# Patient Record
Sex: Female | Born: 1940 | Race: White | Hispanic: No | Smoking: Never smoker
Health system: Southern US, Community
[De-identification: ages and names within clinical notes are randomized; demographics above are authoritative.]

## PROBLEM LIST (undated history)

## (undated) DIAGNOSIS — Z8489 Family history of other specified conditions: Secondary | ICD-10-CM

## (undated) DIAGNOSIS — F329 Major depressive disorder, single episode, unspecified: Secondary | ICD-10-CM

## (undated) DIAGNOSIS — F419 Anxiety disorder, unspecified: Secondary | ICD-10-CM

## (undated) DIAGNOSIS — G4730 Sleep apnea, unspecified: Secondary | ICD-10-CM

## (undated) DIAGNOSIS — G4700 Insomnia, unspecified: Secondary | ICD-10-CM

## (undated) DIAGNOSIS — R079 Chest pain, unspecified: Secondary | ICD-10-CM

## (undated) DIAGNOSIS — K219 Gastro-esophageal reflux disease without esophagitis: Secondary | ICD-10-CM

## (undated) DIAGNOSIS — K589 Irritable bowel syndrome without diarrhea: Secondary | ICD-10-CM

## (undated) DIAGNOSIS — F32A Depression, unspecified: Secondary | ICD-10-CM

## (undated) DIAGNOSIS — C50919 Malignant neoplasm of unspecified site of unspecified female breast: Secondary | ICD-10-CM

## (undated) DIAGNOSIS — C801 Malignant (primary) neoplasm, unspecified: Secondary | ICD-10-CM

## (undated) DIAGNOSIS — E785 Hyperlipidemia, unspecified: Secondary | ICD-10-CM

## (undated) DIAGNOSIS — K649 Unspecified hemorrhoids: Secondary | ICD-10-CM

## (undated) DIAGNOSIS — I10 Essential (primary) hypertension: Secondary | ICD-10-CM

## (undated) DIAGNOSIS — E119 Type 2 diabetes mellitus without complications: Secondary | ICD-10-CM

## (undated) DIAGNOSIS — R011 Cardiac murmur, unspecified: Secondary | ICD-10-CM

## (undated) DIAGNOSIS — R7302 Impaired glucose tolerance (oral): Secondary | ICD-10-CM

## (undated) DIAGNOSIS — Z23 Encounter for immunization: Secondary | ICD-10-CM

## (undated) DIAGNOSIS — C9110 Chronic lymphocytic leukemia of B-cell type not having achieved remission: Secondary | ICD-10-CM

## (undated) DIAGNOSIS — I209 Angina pectoris, unspecified: Secondary | ICD-10-CM

## (undated) DIAGNOSIS — M1990 Unspecified osteoarthritis, unspecified site: Secondary | ICD-10-CM

## (undated) HISTORY — DX: Unspecified hemorrhoids: K64.9

## (undated) HISTORY — DX: Irritable bowel syndrome, unspecified: K58.9

## (undated) HISTORY — PX: MASTECTOMY: SHX3

## (undated) HISTORY — PX: TONSILLECTOMY: SUR1361

## (undated) HISTORY — DX: Hyperlipidemia, unspecified: E78.5

## (undated) HISTORY — PX: BREAST SURGERY: SHX581

## (undated) HISTORY — PX: CHOLECYSTECTOMY: SHX55

## (undated) HISTORY — DX: Impaired glucose tolerance (oral): R73.02

## (undated) HISTORY — PX: ABDOMINAL HYSTERECTOMY: SHX81

## (undated) HISTORY — DX: Malignant neoplasm of unspecified site of unspecified female breast: C50.919

## (undated) HISTORY — DX: Encounter for immunization: Z23

---

## 1989-01-23 DIAGNOSIS — C801 Malignant (primary) neoplasm, unspecified: Secondary | ICD-10-CM

## 1989-01-23 HISTORY — DX: Malignant (primary) neoplasm, unspecified: C80.1

## 1997-12-08 ENCOUNTER — Other Ambulatory Visit: Admission: RE | Admit: 1997-12-08 | Discharge: 1997-12-08 | Payer: Self-pay | Admitting: Obstetrics and Gynecology

## 1998-09-16 ENCOUNTER — Ambulatory Visit (HOSPITAL_COMMUNITY): Admission: RE | Admit: 1998-09-16 | Discharge: 1998-09-16 | Payer: Self-pay | Admitting: Gastroenterology

## 1998-09-16 ENCOUNTER — Encounter: Payer: Self-pay | Admitting: Gastroenterology

## 1999-06-16 ENCOUNTER — Encounter: Admission: RE | Admit: 1999-06-16 | Discharge: 1999-06-16 | Payer: Self-pay | Admitting: Internal Medicine

## 1999-06-16 ENCOUNTER — Encounter: Payer: Self-pay | Admitting: Internal Medicine

## 1999-07-21 ENCOUNTER — Other Ambulatory Visit: Admission: RE | Admit: 1999-07-21 | Discharge: 1999-07-21 | Payer: Self-pay | Admitting: Obstetrics and Gynecology

## 2000-08-21 ENCOUNTER — Other Ambulatory Visit: Admission: RE | Admit: 2000-08-21 | Discharge: 2000-08-21 | Payer: Self-pay | Admitting: Obstetrics and Gynecology

## 2000-09-28 ENCOUNTER — Encounter: Payer: Self-pay | Admitting: Obstetrics and Gynecology

## 2000-09-28 ENCOUNTER — Encounter: Admission: RE | Admit: 2000-09-28 | Discharge: 2000-09-28 | Payer: Self-pay | Admitting: Obstetrics and Gynecology

## 2000-10-18 ENCOUNTER — Ambulatory Visit (HOSPITAL_COMMUNITY): Admission: RE | Admit: 2000-10-18 | Discharge: 2000-10-18 | Payer: Self-pay | Admitting: Gastroenterology

## 2001-10-10 ENCOUNTER — Encounter: Admission: RE | Admit: 2001-10-10 | Discharge: 2001-10-10 | Payer: Self-pay | Admitting: Obstetrics and Gynecology

## 2001-10-10 ENCOUNTER — Encounter: Payer: Self-pay | Admitting: Obstetrics and Gynecology

## 2001-10-24 ENCOUNTER — Other Ambulatory Visit: Admission: RE | Admit: 2001-10-24 | Discharge: 2001-10-24 | Payer: Self-pay | Admitting: Obstetrics and Gynecology

## 2002-08-06 ENCOUNTER — Emergency Department (HOSPITAL_COMMUNITY): Admission: EM | Admit: 2002-08-06 | Discharge: 2002-08-07 | Payer: Self-pay | Admitting: Emergency Medicine

## 2002-08-06 ENCOUNTER — Encounter: Payer: Self-pay | Admitting: Emergency Medicine

## 2002-10-21 ENCOUNTER — Encounter: Admission: RE | Admit: 2002-10-21 | Discharge: 2002-10-21 | Payer: Self-pay | Admitting: Obstetrics and Gynecology

## 2002-10-21 ENCOUNTER — Encounter: Payer: Self-pay | Admitting: Obstetrics and Gynecology

## 2003-02-24 ENCOUNTER — Other Ambulatory Visit: Admission: RE | Admit: 2003-02-24 | Discharge: 2003-02-24 | Payer: Self-pay | Admitting: Obstetrics and Gynecology

## 2003-04-22 ENCOUNTER — Other Ambulatory Visit (HOSPITAL_COMMUNITY): Admission: RE | Admit: 2003-04-22 | Discharge: 2003-04-27 | Payer: Self-pay | Admitting: Psychiatry

## 2003-06-15 ENCOUNTER — Encounter: Admission: RE | Admit: 2003-06-15 | Discharge: 2003-06-15 | Payer: Self-pay | Admitting: Psychiatry

## 2004-03-14 ENCOUNTER — Encounter: Admission: RE | Admit: 2004-03-14 | Discharge: 2004-03-14 | Payer: Self-pay | Admitting: Obstetrics and Gynecology

## 2004-07-19 ENCOUNTER — Ambulatory Visit (HOSPITAL_BASED_OUTPATIENT_CLINIC_OR_DEPARTMENT_OTHER): Admission: RE | Admit: 2004-07-19 | Discharge: 2004-07-19 | Payer: Self-pay

## 2004-07-24 ENCOUNTER — Ambulatory Visit: Payer: Self-pay | Admitting: Internal Medicine

## 2004-09-01 ENCOUNTER — Ambulatory Visit (HOSPITAL_BASED_OUTPATIENT_CLINIC_OR_DEPARTMENT_OTHER): Admission: RE | Admit: 2004-09-01 | Discharge: 2004-09-01 | Payer: Self-pay | Admitting: *Deleted

## 2004-09-04 ENCOUNTER — Ambulatory Visit: Payer: Self-pay | Admitting: Internal Medicine

## 2004-10-18 ENCOUNTER — Ambulatory Visit: Payer: Self-pay | Admitting: Internal Medicine

## 2005-05-03 ENCOUNTER — Encounter: Admission: RE | Admit: 2005-05-03 | Discharge: 2005-05-03 | Payer: Self-pay | Admitting: Internal Medicine

## 2005-11-07 ENCOUNTER — Ambulatory Visit (HOSPITAL_COMMUNITY): Admission: RE | Admit: 2005-11-07 | Discharge: 2005-11-07 | Payer: Self-pay | Admitting: Orthopedic Surgery

## 2006-08-23 ENCOUNTER — Encounter: Admission: RE | Admit: 2006-08-23 | Discharge: 2006-08-23 | Payer: Self-pay | Admitting: Internal Medicine

## 2007-12-05 ENCOUNTER — Encounter: Admission: RE | Admit: 2007-12-05 | Discharge: 2007-12-05 | Payer: Self-pay | Admitting: Internal Medicine

## 2009-10-04 ENCOUNTER — Encounter: Admission: RE | Admit: 2009-10-04 | Discharge: 2009-10-04 | Payer: Self-pay | Admitting: Internal Medicine

## 2010-02-12 ENCOUNTER — Encounter: Payer: Self-pay | Admitting: Obstetrics and Gynecology

## 2010-02-13 ENCOUNTER — Encounter: Payer: Self-pay | Admitting: Internal Medicine

## 2010-06-10 NOTE — Procedures (Signed)
[name], Rebecca Lowery              ACCOUNT NO.:  [id number]   MEDICAL RECORD NO.:  [id number]          PATIENT TYPE:  OUT   LOCATION:  SLEEP CENTER                 FACILITY:  [facility]   PHYSICIAN:  Clinton D. Maple Hudson, M.D. DATE OF BIRTH:  1941/01/21   DATE OF STUDY:                              NOCTURNAL POLYSOMNOGRAM   REFERRING PHYSICIAN:  Dr. Forde Radon.   DATE OF STUDY:  September 01, 2004.   INDICATION FOR STUDY:  Insomnia with sleep apnea. Epworth sleepiness score  12/24, BMI 34. Weight 190 pounds. A diagnostic and PSG on July 19, 2004  reported an RDI of 13.8 per hour with short sleep time. C-PAP titration is  requested.   SLEEP ARCHITECTURE:  Total sleep time 28 minutes with sleep efficiency 7%.  Stage I was 23%, stage II 55%, stages III and IV 21%, REM was absent. Sleep  latency 47 minutes, awake after sleep onset 372 minutes (6.2 hours) arousal  index 21. No medication taken.   RESPIRATORY DATA:  C-PAP titration to 7 CWP, RDI 0 per hour using a  Respironics ComfortGel nasal mask size small with heated humidifier.   OXYGEN DATA:  Snoring was prevented during brief monitoring opportunity, at  a C-PAP 7 with oxygen saturation maintained 95-98% on C-PAP.   CARDIAC DATA:  Normal sinus rhythm.   MOVEMENT/PARASOMNIA:  Frequent leg jerks and one bathroom break. Technician  reported she experienced severe limb jerks and restlessness throughout the  night. She complained that she had taken Ambien for 10 years but it did not  work well any more.   IMPRESSION/RECOMMENDATIONS:  1.  Significant insomnia with difficulty initiating and maintaining sleep.  2.  Mild obstructive sleep apnea/hypopneas syndrome documented on July 19, 2004 with an respiratory disturbance index of 13.8 per hour.  3.  Current C-PAP titration effort to 7 centimeter of water pressure was not      associated with apneas or snoring during limited period of time while      asleep with C-PAP. A small Respironics  ComfortGel nasal mask was used      with heated humidifier. She may learn to and benefit from this approach      at home but treatment of this will need of her insomnia pattern.  4.  Frequent leg jerks periodic with movement with arousal syndrome. This      may correspond to daytime restless leg syndrome that is present.      Clinton D. Maple Hudson, M.D.  Diplomate, Biomedical engineer of Sleep Medicine  Electronically Signed     CDY/MEDQ  D:  09/04/2004 12:09:51  T:  09/04/2004 22:33:07  Job:  [id number]

## 2010-06-10 NOTE — Procedures (Signed)
Rebecca Lowery, Rebecca Lowery              ACCOUNT NO.:  [id number]   MEDICAL RECORD NO.:  [id number]          PATIENT TYPE:  OUT   LOCATION:  SLEEP CENTER                 FACILITY:  [facility]   PHYSICIAN:  Clinton D. Maple Hudson, M.D. DATE OF BIRTH:  [birth year]   DATE OF STUDY:  07/19/2004                              NOCTURNAL POLYSOMNOGRAM   REFERRING PHYSICIAN:  Dr. Ronaldo Miyamoto Long   DATE OF STUDY:  July 19, 2004   INDICATION FOR STUDY:  Insomnia with sleep apnea.  Epworth Sleepiness Score  8/24, BMI 34, weight 190 pounds.   SLEEP ARCHITECTURE:  Total sleep time very short, 52 minutes, sleep  efficiency 15%.  Stage I was 53%, stage II 47%, stages III, IV and REM were  absent.  Sleep latency 165 minutes with sleep onset at 1:20 a.m.  Awake  after sleep onset 137 minutes.  Arousal index markedly increased at 120.  Technician described her as extremely restless during the night with  difficulty obtaining and maintaining sleep.  She sat up on the side of the  bed several times stating that she was trying to get comfortable.  She also  complained of nausea and headache.  She took 15 mg of Ambien at lights out  and described usual pattern at home as sleeping for a few hours and then  awake until early morning.  She woke at 3:49 a.m. complaining of headache,  took three ibuprofen.  She was unable to return to sleep and the study was  ended at 4:33 a.m. at the patient's request.  She gives history suggesting  that she sleeps best in the daytime which may reflect some nocturnal anxiety  or impaired sleep hygiene.   RESPIRATORY DATA:  NPSG protocol.  Respiratory disturbance index (RDI, AHI)  13.8 obstructive events per hour indicating mild obstructive sleep  apnea/hypopnea syndrome.  This reflected 12 hypopneas in this short sleep  time.  All events happened while lying on the left side.   OXYGEN DATA:  Moderate snoring with oxygen desaturation to a nadir of 91%.  Mean oxygen saturation through the study was  95% on room air.   CARDIAC DATA:  Normal sinus rhythm.   MOVEMENT/PARASOMNIA:  A total of 184 limb jerks were recorded of which 25  were associated with arousal or awakening for a periodic limb movement with  arousal index markedly increased at 28.8 per hour.  These correlated with  intervals of arousal.   IMPRESSION/RECOMMENDATION:  1.  Markedly short sleep time with difficulty initiating and maintaining      sleep, complaints of headache and nausea despite bedtime Ambien.  2.  Mild obstructive sleep apnea/hypopnea syndrome, respiratory disturbance      index 13.8 per hour with moderate snoring and oxygen desaturation to      91%.  Significance of respiratory disturbance is less clear because of      the very short sleep time but, if representative of a full night, a      score in this range would invite return for continuous positive airway      pressure titration or evaluation for alternative therapies for sleep  apnea.  3.  Periodic limb movement with arousal, 28.8 per hour.  Specific therapy      directed first at the leg jerks, such as clonazepam or Requip, might be      considered.      Clinton D. Maple Hudson, M.D.  Diplomat    CDY/MEDQ  D:  07/24/2004 11:59:36  T:  07/24/2004 18:44:54  Job:  [id number]

## 2010-09-21 ENCOUNTER — Other Ambulatory Visit: Payer: Self-pay | Admitting: Internal Medicine

## 2010-09-21 DIAGNOSIS — Z9012 Acquired absence of left breast and nipple: Secondary | ICD-10-CM

## 2010-10-21 ENCOUNTER — Ambulatory Visit
Admission: RE | Admit: 2010-10-21 | Discharge: 2010-10-21 | Disposition: A | Discharge status: Home / Self Care | Payer: PRIVATE HEALTH INSURANCE | Source: Ambulatory Visit | Attending: Internal Medicine | Admitting: Internal Medicine

## 2010-10-21 DIAGNOSIS — Z9012 Acquired absence of left breast and nipple: Secondary | ICD-10-CM

## 2011-04-14 ENCOUNTER — Other Ambulatory Visit (HOSPITAL_COMMUNITY): Payer: Self-pay | Admitting: Cardiology

## 2011-04-14 ENCOUNTER — Other Ambulatory Visit: Payer: Self-pay | Admitting: Cardiology

## 2011-04-14 ENCOUNTER — Ambulatory Visit
Admission: RE | Admit: 2011-04-14 | Discharge: 2011-04-14 | Disposition: A | Discharge status: Home / Self Care | Payer: Medicare Other | Source: Ambulatory Visit | Attending: Cardiology | Admitting: Cardiology

## 2011-04-14 DIAGNOSIS — R0789 Other chest pain: Secondary | ICD-10-CM

## 2011-04-19 ENCOUNTER — Encounter (HOSPITAL_COMMUNITY)
Admission: RE | Admit: 2011-04-19 | Discharge: 2011-04-19 | Disposition: A | Discharge status: Home / Self Care | Payer: Medicare Other | Source: Ambulatory Visit | Attending: Cardiology | Admitting: Cardiology

## 2011-04-19 DIAGNOSIS — E119 Type 2 diabetes mellitus without complications: Secondary | ICD-10-CM | POA: Insufficient documentation

## 2011-04-19 DIAGNOSIS — R079 Chest pain, unspecified: Secondary | ICD-10-CM | POA: Insufficient documentation

## 2011-04-19 DIAGNOSIS — I10 Essential (primary) hypertension: Secondary | ICD-10-CM | POA: Insufficient documentation

## 2011-04-19 MED ORDER — REGADENOSON 0.4 MG/5ML IV SOLN
INTRAVENOUS | Status: AC
  Administered 2011-04-19: 12:00:00
  Filled 2011-04-19: qty 5

## 2011-04-19 MED ORDER — TECHNETIUM TC 99M TETROFOSMIN IV KIT
10.0000 | PACK | Freq: Once | INTRAVENOUS | Status: AC | PRN
  Administered 2011-04-19: 10 via INTRAVENOUS

## 2011-04-19 MED ORDER — TECHNETIUM TC 99M TETROFOSMIN IV KIT
30.0000 | PACK | Freq: Once | INTRAVENOUS | Status: AC | PRN
Start: 2011-04-19 — End: 2011-04-19
  Administered 2011-04-19: 30 via INTRAVENOUS

## 2011-04-19 MED ORDER — REGADENOSON 0.4 MG/5ML IV SOLN
INTRAVENOUS | Status: AC
  Filled 2011-04-19: qty 5

## 2012-03-13 ENCOUNTER — Other Ambulatory Visit (HOSPITAL_COMMUNITY): Payer: Self-pay | Admitting: Orthopaedic Surgery

## 2012-03-20 ENCOUNTER — Encounter (HOSPITAL_COMMUNITY): Payer: Self-pay | Admitting: Pharmacy Technician

## 2012-03-21 ENCOUNTER — Encounter (HOSPITAL_COMMUNITY): Payer: Self-pay

## 2012-03-21 ENCOUNTER — Encounter (HOSPITAL_COMMUNITY)
Admission: RE | Admit: 2012-03-21 | Discharge: 2012-03-21 | Disposition: A | Discharge status: Home / Self Care | Payer: Medicare Other | Source: Ambulatory Visit | Attending: Orthopaedic Surgery | Admitting: Orthopaedic Surgery

## 2012-03-21 HISTORY — DX: Gastro-esophageal reflux disease without esophagitis: K21.9

## 2012-03-21 HISTORY — DX: Chest pain, unspecified: R07.9

## 2012-03-21 HISTORY — DX: Family history of other specified conditions: Z84.89

## 2012-03-21 HISTORY — DX: Essential (primary) hypertension: I10

## 2012-03-21 HISTORY — DX: Cardiac murmur, unspecified: R01.1

## 2012-03-21 HISTORY — DX: Type 2 diabetes mellitus without complications: E11.9

## 2012-03-21 HISTORY — DX: Major depressive disorder, single episode, unspecified: F32.9

## 2012-03-21 HISTORY — DX: Depression, unspecified: F32.A

## 2012-03-21 HISTORY — DX: Sleep apnea, unspecified: G47.30

## 2012-03-21 HISTORY — DX: Anxiety disorder, unspecified: F41.9

## 2012-03-21 HISTORY — DX: Malignant (primary) neoplasm, unspecified: C80.1

## 2012-03-21 HISTORY — DX: Insomnia, unspecified: G47.00

## 2012-03-21 HISTORY — DX: Unspecified osteoarthritis, unspecified site: M19.90

## 2012-03-21 LAB — URINALYSIS, ROUTINE W REFLEX MICROSCOPIC
Bilirubin Urine: NEGATIVE
Nitrite: POSITIVE — AB
Specific Gravity, Urine: 1.018 (ref 1.005–1.030)
Urobilinogen, UA: 0.2 mg/dL (ref 0.0–1.0)

## 2012-03-21 LAB — SURGICAL PCR SCREEN
MRSA, PCR: NEGATIVE
Staphylococcus aureus: NEGATIVE

## 2012-03-21 LAB — APTT: aPTT: 32 seconds (ref 24–37)

## 2012-03-21 LAB — CBC
MCH: 30.4 pg (ref 26.0–34.0)
MCV: 91.1 fL (ref 78.0–100.0)
Platelets: 295 10*3/uL (ref 150–400)
RDW: 13.5 % (ref 11.5–15.5)
WBC: 13.2 10*3/uL — ABNORMAL HIGH (ref 4.0–10.5)

## 2012-03-21 LAB — PROTIME-INR: INR: 1.02 (ref 0.00–1.49)

## 2012-03-21 LAB — BASIC METABOLIC PANEL
CO2: 29 mEq/L (ref 19–32)
Calcium: 10.2 mg/dL (ref 8.4–10.5)
Creatinine, Ser: 0.67 mg/dL (ref 0.50–1.10)

## 2012-03-21 LAB — URINE MICROSCOPIC-ADD ON

## 2012-03-21 NOTE — Patient Instructions (Signed)
YOUR SURGERY IS SCHEDULED AT [facility]  ON:  Friday  3/7  REPORT TO [place] SHORT STAY CENTER AT:  7:30 AM      PHONE # FOR SHORT STAY IS [phone]  DO NOT EAT OR DRINK ANYTHING AFTER MIDNIGHT THE NIGHT BEFORE YOUR SURGERY.  YOU MAY BRUSH YOUR TEETH, RINSE OUT YOUR MOUTH--BUT NO WATER, NO FOOD, NO CHEWING GUM, NO MINTS, NO CANDIES, NO CHEWING TOBACCO.  PLEASE TAKE THE FOLLOWING MEDICATIONS THE AM OF YOUR SURGERY WITH A FEW SIPS OF WATER:  ABILIFY, WELLBUTRIN, LORAZEPAM    IF YOU ARE DIABETIC:  DO NOT TAKE ANY DIABETIC MEDICATIONS THE AM OF YOUR SURGERY.  IF YOU TAKE INSULIN IN THE EVENINGS--PLEASE ONLY TAKE 1/2 NORMAL EVENING DOSE THE NIGHT BEFORE YOUR SURGERY.  NO INSULIN THE AM OF YOUR SURGERY.  IF YOU HAVE SLEEP APNEA AND USE CPAP OR BIPAP--PLEASE BRING THE MASK AND THE TUBING.  DO NOT BRING YOUR MACHINE.  DO NOT BRING VALUABLES, MONEY, CREDIT CARDS.  DO NOT WEAR JEWELRY, MAKE-UP, NAIL POLISH AND NO METAL PINS OR CLIPS IN YOUR HAIR. CONTACT LENS, DENTURES / PARTIALS, GLASSES SHOULD NOT BE WORN TO SURGERY AND IN MOST CASES-HEARING AIDS WILL NEED TO BE REMOVED.  BRING YOUR GLASSES CASE, ANY EQUIPMENT NEEDED FOR YOUR CONTACT LENS. FOR PATIENTS ADMITTED TO THE HOSPITAL--CHECK OUT TIME THE DAY OF DISCHARGE IS 11:00 AM.  ALL INPATIENT ROOMS ARE PRIVATE - WITH BATHROOM, TELEPHONE, TELEVISION AND WIFI INTERNET.                              PLEASE READ OVER ANY  FACT SHEETS THAT YOU WERE GIVEN: MRSA INFORMATION, BLOOD TRANSFUSION INFORMATION FAILURE TO FOLLOW THESE INSTRUCTIONS MAY RESULT IN THE CANCELLATION OF YOUR SURGERY.   PATIENT SIGNATURE_________________________________

## 2012-03-21 NOTE — Pre-Procedure Instructions (Signed)
CXR REPORT IN EPIC FROM 04/14/11. NORMAL NUCLEAR STRESS TEST IN EPIC FROM 04/19/11. EKG WAS DONE TODAY AT [facility].

## 2012-03-21 NOTE — Pre-Procedure Instructions (Signed)
SHERRIE BILLINGS NOTIFIED THAT I AM TRYING TO FAX TO DR. BLACKMAN PT'S ABNORMAL URINE REPORTS, CBC AND BMET FOR REVIEW OF ABNORMALS AND THAT PT WANTS TO TALK TO DR BEFORE SIGNING THE CONSENT FOR SURGERY-SHE PLANS TO CALL HIM.

## 2012-03-23 LAB — URINE CULTURE

## 2012-03-25 NOTE — Pre-Procedure Instructions (Signed)
URINE CULTURE RESULTS FAXED TO DR. C. BLACKMAN'S OFFICE AND SHERRIE IN SURGERY SCHEDULING AT DR. [facility] OFFICE NOTIFIED

## 2012-03-29 ENCOUNTER — Inpatient Hospital Stay (HOSPITAL_COMMUNITY): Payer: Medicare Other

## 2012-03-29 ENCOUNTER — Encounter (HOSPITAL_COMMUNITY): Payer: Self-pay | Admitting: Anesthesiology

## 2012-03-29 ENCOUNTER — Encounter (HOSPITAL_COMMUNITY): Payer: Self-pay | Admitting: *Deleted

## 2012-03-29 ENCOUNTER — Inpatient Hospital Stay (HOSPITAL_COMMUNITY): Payer: Medicare Other | Admitting: Anesthesiology

## 2012-03-29 ENCOUNTER — Encounter (HOSPITAL_COMMUNITY)
Admission: RE | Disposition: A | Discharge status: Skilled Nursing Facility | Payer: Self-pay | Source: Ambulatory Visit | Attending: Orthopaedic Surgery

## 2012-03-29 ENCOUNTER — Inpatient Hospital Stay (HOSPITAL_COMMUNITY)
Admission: RE | Admit: 2012-03-29 | Discharge: 2012-04-01 | DRG: 470 | Disposition: A | Discharge status: Skilled Nursing Facility | Payer: Medicare Other | Source: Ambulatory Visit | Attending: Orthopaedic Surgery | Admitting: Orthopaedic Surgery

## 2012-03-29 DIAGNOSIS — K219 Gastro-esophageal reflux disease without esophagitis: Secondary | ICD-10-CM | POA: Diagnosis present

## 2012-03-29 DIAGNOSIS — F3289 Other specified depressive episodes: Secondary | ICD-10-CM | POA: Diagnosis present

## 2012-03-29 DIAGNOSIS — Z96649 Presence of unspecified artificial hip joint: Secondary | ICD-10-CM

## 2012-03-29 DIAGNOSIS — I059 Rheumatic mitral valve disease, unspecified: Secondary | ICD-10-CM | POA: Diagnosis present

## 2012-03-29 DIAGNOSIS — Z01812 Encounter for preprocedural laboratory examination: Secondary | ICD-10-CM

## 2012-03-29 DIAGNOSIS — I10 Essential (primary) hypertension: Secondary | ICD-10-CM | POA: Diagnosis present

## 2012-03-29 DIAGNOSIS — Z853 Personal history of malignant neoplasm of breast: Secondary | ICD-10-CM

## 2012-03-29 DIAGNOSIS — M1610 Unilateral primary osteoarthritis, unspecified hip: Principal | ICD-10-CM | POA: Diagnosis present

## 2012-03-29 DIAGNOSIS — G4730 Sleep apnea, unspecified: Secondary | ICD-10-CM | POA: Diagnosis present

## 2012-03-29 DIAGNOSIS — E119 Type 2 diabetes mellitus without complications: Secondary | ICD-10-CM | POA: Diagnosis present

## 2012-03-29 DIAGNOSIS — D62 Acute posthemorrhagic anemia: Secondary | ICD-10-CM | POA: Diagnosis not present

## 2012-03-29 HISTORY — PX: TOTAL HIP ARTHROPLASTY: SHX124

## 2012-03-29 LAB — GLUCOSE, CAPILLARY: Glucose-Capillary: 150 mg/dL — ABNORMAL HIGH (ref 70–99)

## 2012-03-29 LAB — TYPE AND SCREEN: ABO/RH(D): O POS

## 2012-03-29 SURGERY — ARTHROPLASTY, HIP, TOTAL, ANTERIOR APPROACH
Anesthesia: Spinal | Site: Hip | Laterality: Right | Wound class: Clean

## 2012-03-29 MED ORDER — ACETAMINOPHEN 325 MG PO TABS
650.0000 mg | ORAL_TABLET | Freq: Four times a day (QID) | ORAL | Status: DC | PRN
  Administered 2012-03-30: 650 mg via ORAL
  Filled 2012-03-29: qty 2

## 2012-03-29 MED ORDER — CEFAZOLIN SODIUM-DEXTROSE 2-3 GM-% IV SOLR
2.0000 g | INTRAVENOUS | Status: AC
  Administered 2012-03-29: 2 g via INTRAVENOUS

## 2012-03-29 MED ORDER — MENTHOL 3 MG MT LOZG
1.0000 | LOZENGE | OROMUCOSAL | Status: DC | PRN
  Filled 2012-03-29: qty 9

## 2012-03-29 MED ORDER — ZOLPIDEM TARTRATE 5 MG PO TABS
5.0000 mg | ORAL_TABLET | Freq: Every evening | ORAL | Status: DC | PRN
  Filled 2012-03-29: qty 1

## 2012-03-29 MED ORDER — OXYCODONE HCL 5 MG PO TABS
5.0000 mg | ORAL_TABLET | ORAL | Status: DC | PRN
  Administered 2012-03-29 – 2012-03-30 (×5): 5 mg via ORAL
  Administered 2012-03-30: 10 mg via ORAL
  Administered 2012-03-30 (×2): 5 mg via ORAL
  Administered 2012-03-30 – 2012-03-31 (×2): 10 mg via ORAL
  Administered 2012-03-31: 5 mg via ORAL
  Administered 2012-03-31: 10 mg via ORAL
  Administered 2012-03-31 – 2012-04-01 (×3): 5 mg via ORAL
  Administered 2012-04-01: 10 mg via ORAL
  Filled 2012-03-29 (×5): qty 1
  Filled 2012-03-29: qty 2
  Filled 2012-03-29: qty 1
  Filled 2012-03-29: qty 2
  Filled 2012-03-29 (×4): qty 1
  Filled 2012-03-29 (×5): qty 2
  Filled 2012-03-29: qty 1

## 2012-03-29 MED ORDER — PROPOFOL 10 MG/ML IV BOLUS
INTRAVENOUS | Status: DC | PRN
  Administered 2012-03-29: 50 mg via INTRAVENOUS

## 2012-03-29 MED ORDER — METHOCARBAMOL 100 MG/ML IJ SOLN: 500.0000 mg | Freq: Four times a day (QID) | INTRAVENOUS | Status: DC | PRN

## 2012-03-29 MED ORDER — ACETAMINOPHEN 650 MG RE SUPP: 650.0000 mg | Freq: Four times a day (QID) | RECTAL | Status: DC | PRN

## 2012-03-29 MED ORDER — DIPHENHYDRAMINE HCL 12.5 MG/5ML PO ELIX
12.5000 mg | ORAL_SOLUTION | ORAL | Status: DC | PRN
  Administered 2012-04-01: 25 mg via ORAL
  Filled 2012-03-29: qty 10

## 2012-03-29 MED ORDER — FERROUS SULFATE 325 (65 FE) MG PO TABS
325.0000 mg | ORAL_TABLET | Freq: Three times a day (TID) | ORAL | Status: DC
  Administered 2012-03-29 – 2012-04-01 (×6): 325 mg via ORAL
  Filled 2012-03-29 (×12): qty 1

## 2012-03-29 MED ORDER — CEFAZOLIN SODIUM 1-5 GM-% IV SOLN
1.0000 g | Freq: Four times a day (QID) | INTRAVENOUS | Status: AC
  Administered 2012-03-29 (×2): 1 g via INTRAVENOUS
  Filled 2012-03-29 (×2): qty 50

## 2012-03-29 MED ORDER — PHENYLEPHRINE HCL 10 MG/ML IJ SOLN
10.0000 mg | INTRAMUSCULAR | Status: DC | PRN
  Administered 2012-03-29: 25 ug/min via INTRAVENOUS

## 2012-03-29 MED ORDER — STERILE WATER FOR IRRIGATION IR SOLN
Status: DC | PRN
  Administered 2012-03-29: 3000 mL

## 2012-03-29 MED ORDER — METOCLOPRAMIDE HCL 10 MG PO TABS: 5.0000 mg | ORAL_TABLET | Freq: Three times a day (TID) | ORAL | Status: DC | PRN

## 2012-03-29 MED ORDER — METOCLOPRAMIDE HCL 5 MG/ML IJ SOLN: 5.0000 mg | Freq: Three times a day (TID) | INTRAMUSCULAR | Status: DC | PRN

## 2012-03-29 MED ORDER — 0.9 % SODIUM CHLORIDE (POUR BTL) OPTIME
TOPICAL | Status: DC | PRN
  Administered 2012-03-29: 1000 mL

## 2012-03-29 MED ORDER — ALUM & MAG HYDROXIDE-SIMETH 200-200-20 MG/5ML PO SUSP
30.0000 mL | ORAL | Status: DC | PRN
  Administered 2012-03-30 – 2012-03-31 (×2): 30 mL via ORAL
  Filled 2012-03-29 (×2): qty 30

## 2012-03-29 MED ORDER — PANTOPRAZOLE SODIUM 40 MG PO TBEC
40.0000 mg | DELAYED_RELEASE_TABLET | Freq: Every day | ORAL | Status: DC
  Administered 2012-03-29 – 2012-03-31 (×3): 40 mg via ORAL
  Filled 2012-03-29 (×4): qty 1

## 2012-03-29 MED ORDER — PROPOFOL 10 MG/ML IV EMUL
INTRAVENOUS | Status: DC | PRN
  Administered 2012-03-29: 75 ug/kg/min via INTRAVENOUS

## 2012-03-29 MED ORDER — DOCUSATE SODIUM 100 MG PO CAPS
100.0000 mg | ORAL_CAPSULE | Freq: Two times a day (BID) | ORAL | Status: DC
  Administered 2012-03-29 – 2012-03-31 (×6): 100 mg via ORAL
  Filled 2012-03-29 (×8): qty 1

## 2012-03-29 MED ORDER — ARIPIPRAZOLE 2 MG PO TABS
2.0000 mg | ORAL_TABLET | Freq: Every day | ORAL | Status: DC
  Administered 2012-03-30 – 2012-03-31 (×2): 2 mg via ORAL
  Filled 2012-03-29 (×3): qty 1

## 2012-03-29 MED ORDER — OMEPRAZOLE MAGNESIUM 20 MG PO TBEC: 20.0000 mg | DELAYED_RELEASE_TABLET | Freq: Every day | ORAL | Status: DC

## 2012-03-29 MED ORDER — FENTANYL CITRATE 0.05 MG/ML IJ SOLN
INTRAMUSCULAR | Status: DC | PRN
  Administered 2012-03-29: 50 ug via INTRAVENOUS

## 2012-03-29 MED ORDER — RIVAROXABAN 10 MG PO TABS
10.0000 mg | ORAL_TABLET | Freq: Every day | ORAL | Status: DC
  Administered 2012-03-30 – 2012-04-01 (×3): 10 mg via ORAL
  Filled 2012-03-29 (×4): qty 1

## 2012-03-29 MED ORDER — LACTATED RINGERS IV SOLN
INTRAVENOUS | Status: DC | PRN
  Administered 2012-03-29 (×3): via INTRAVENOUS

## 2012-03-29 MED ORDER — PHENOL 1.4 % MT LIQD
1.0000 | OROMUCOSAL | Status: DC | PRN
  Filled 2012-03-29: qty 177

## 2012-03-29 MED ORDER — OXYCODONE HCL ER 10 MG PO T12A
10.0000 mg | EXTENDED_RELEASE_TABLET | Freq: Two times a day (BID) | ORAL | Status: DC
  Administered 2012-03-29 – 2012-03-30 (×4): 10 mg via ORAL
  Filled 2012-03-29 (×4): qty 1

## 2012-03-29 MED ORDER — LACTATED RINGERS IV SOLN: INTRAVENOUS | Status: DC

## 2012-03-29 MED ORDER — LORAZEPAM 0.5 MG PO TABS
0.5000 mg | ORAL_TABLET | Freq: Two times a day (BID) | ORAL | Status: DC
  Administered 2012-03-29 – 2012-04-01 (×5): 0.5 mg via ORAL
  Filled 2012-03-29 (×5): qty 1

## 2012-03-29 MED ORDER — METHOCARBAMOL 500 MG PO TABS
500.0000 mg | ORAL_TABLET | Freq: Four times a day (QID) | ORAL | Status: DC | PRN
  Administered 2012-03-29 – 2012-04-01 (×8): 500 mg via ORAL
  Filled 2012-03-29 (×7): qty 1

## 2012-03-29 MED ORDER — MELATONIN 3 MG PO TABS: 1.0000 | ORAL_TABLET | Freq: Every day | ORAL | Status: DC

## 2012-03-29 MED ORDER — HYDROMORPHONE HCL PF 1 MG/ML IJ SOLN
0.2500 mg | INTRAMUSCULAR | Status: DC | PRN
  Administered 2012-03-29 (×2): 0.5 mg via INTRAVENOUS

## 2012-03-29 MED ORDER — MEPERIDINE HCL 50 MG/ML IJ SOLN: 6.2500 mg | INTRAMUSCULAR | Status: DC | PRN

## 2012-03-29 MED ORDER — ONDANSETRON HCL 4 MG PO TABS: 4.0000 mg | ORAL_TABLET | Freq: Four times a day (QID) | ORAL | Status: DC | PRN

## 2012-03-29 MED ORDER — SULFAMETHOXAZOLE-TMP DS 800-160 MG PO TABS
1.0000 | ORAL_TABLET | Freq: Two times a day (BID) | ORAL | Status: DC
  Administered 2012-03-29 – 2012-03-31 (×6): 1 via ORAL
  Filled 2012-03-29 (×8): qty 1

## 2012-03-29 MED ORDER — BUPIVACAINE HCL (PF) 0.5 % IJ SOLN
INTRAMUSCULAR | Status: DC | PRN
  Administered 2012-03-29: 3 mL

## 2012-03-29 MED ORDER — ONDANSETRON HCL 4 MG/2ML IJ SOLN
4.0000 mg | Freq: Four times a day (QID) | INTRAMUSCULAR | Status: DC | PRN
  Administered 2012-03-29: 4 mg via INTRAVENOUS
  Filled 2012-03-29: qty 2

## 2012-03-29 MED ORDER — MIDAZOLAM HCL 5 MG/5ML IJ SOLN
INTRAMUSCULAR | Status: DC | PRN
  Administered 2012-03-29: 2 mg via INTRAVENOUS

## 2012-03-29 MED ORDER — PROMETHAZINE HCL 25 MG/ML IJ SOLN: 6.2500 mg | INTRAMUSCULAR | Status: DC | PRN

## 2012-03-29 MED ORDER — BUPROPION HCL ER (SR) 150 MG PO TB12
150.0000 mg | ORAL_TABLET | Freq: Three times a day (TID) | ORAL | Status: DC
  Administered 2012-03-29 – 2012-03-31 (×8): 150 mg via ORAL
  Filled 2012-03-29 (×11): qty 1

## 2012-03-29 MED ORDER — SODIUM CHLORIDE 0.9 % IR SOLN
Status: DC | PRN
  Administered 2012-03-29: 1000 mL

## 2012-03-29 MED ORDER — HYDROMORPHONE HCL PF 1 MG/ML IJ SOLN
1.0000 mg | INTRAMUSCULAR | Status: DC | PRN
  Administered 2012-03-29 (×2): 1 mg via INTRAVENOUS
  Filled 2012-03-29 (×2): qty 1

## 2012-03-29 MED ORDER — ACETAMINOPHEN 10 MG/ML IV SOLN
INTRAVENOUS | Status: DC | PRN
  Administered 2012-03-29: 1000 mg via INTRAVENOUS

## 2012-03-29 MED ORDER — SODIUM CHLORIDE 0.9 % IV SOLN
INTRAVENOUS | Status: DC
  Administered 2012-03-29: 16:00:00 via INTRAVENOUS

## 2012-03-29 SURGICAL SUPPLY — 38 items
BAG ZIPLOCK 12X15 (MISCELLANEOUS) ×4 IMPLANT
BLADE SAW SGTL 18X1.27X75 (BLADE) ×2 IMPLANT
CELLS DAT CNTRL 66122 CELL SVR (MISCELLANEOUS) ×1 IMPLANT
CLOTH BEACON ORANGE TIMEOUT ST (SAFETY) ×2 IMPLANT
DERMABOND ADVANCED (GAUZE/BANDAGES/DRESSINGS) ×1
DERMABOND ADVANCED .7 DNX12 (GAUZE/BANDAGES/DRESSINGS) ×1 IMPLANT
DRAPE C-ARM 42X72 X-RAY (DRAPES) ×2 IMPLANT
DRAPE STERI IOBAN 125X83 (DRAPES) ×2 IMPLANT
DRAPE U-SHAPE 47X51 STRL (DRAPES) ×6 IMPLANT
DRSG AQUACEL AG ADV 3.5X10 (GAUZE/BANDAGES/DRESSINGS) ×2 IMPLANT
DURAPREP 26ML APPLICATOR (WOUND CARE) ×2 IMPLANT
ELECT BLADE TIP CTD 4 INCH (ELECTRODE) ×2 IMPLANT
ELECT REM PT RETURN 9FT ADLT (ELECTROSURGICAL) ×2
ELECTRODE REM PT RTRN 9FT ADLT (ELECTROSURGICAL) ×1 IMPLANT
FACESHIELD LNG OPTICON STERILE (SAFETY) ×10 IMPLANT
GLOVE BIO SURGEON STRL SZ7 (GLOVE) ×2 IMPLANT
GLOVE BIO SURGEON STRL SZ7.5 (GLOVE) ×2 IMPLANT
GLOVE BIOGEL PI IND STRL 7.5 (GLOVE) IMPLANT
GLOVE BIOGEL PI IND STRL 8 (GLOVE) ×1 IMPLANT
GLOVE BIOGEL PI INDICATOR 7.5 (GLOVE)
GLOVE BIOGEL PI INDICATOR 8 (GLOVE) ×1
GLOVE ECLIPSE 7.0 STRL STRAW (GLOVE) ×2 IMPLANT
GOWN STRL REIN XL XLG (GOWN DISPOSABLE) ×4 IMPLANT
HANDPIECE INTERPULSE COAX TIP (DISPOSABLE) ×1
KIT BASIN OR (CUSTOM PROCEDURE TRAY) ×2 IMPLANT
PACK TOTAL JOINT (CUSTOM PROCEDURE TRAY) ×2 IMPLANT
PADDING CAST COTTON 6X4 STRL (CAST SUPPLIES) ×2 IMPLANT
RTRCTR WOUND ALEXIS 18CM MED (MISCELLANEOUS) ×2
SET HNDPC FAN SPRY TIP SCT (DISPOSABLE) ×1 IMPLANT
SUT ETHIBOND NAB CT1 #1 30IN (SUTURE) ×4 IMPLANT
SUT MNCRL AB 4-0 PS2 18 (SUTURE) ×2 IMPLANT
SUT VIC AB 1 CT1 36 (SUTURE) ×4 IMPLANT
SUT VIC AB 2-0 CT1 27 (SUTURE) ×2
SUT VIC AB 2-0 CT1 TAPERPNT 27 (SUTURE) ×2 IMPLANT
SUT VLOC 180 0 24IN GS25 (SUTURE) ×2 IMPLANT
TOWEL OR 17X26 10 PK STRL BLUE (TOWEL DISPOSABLE) ×4 IMPLANT
TOWEL OR NON WOVEN STRL DISP B (DISPOSABLE) ×2 IMPLANT
TRAY FOLEY CATH 14FRSI W/METER (CATHETERS) ×2 IMPLANT

## 2012-03-29 NOTE — Progress Notes (Signed)
Clinical Social Work Department CLINICAL SOCIAL WORK PLACEMENT NOTE 03/29/2012  Patient:  Rebecca Lowery, Rebecca Lowery  Account Number:  [id number] Admit date:  03/29/2012  Clinical Social Worker:  Cori Razor, LCSW  Date/time:  03/29/2012 03:25 PM  Clinical Social Work is seeking post-discharge placement for this patient at the following level of care:   SKILLED NURSING   (*CSW will update this form in Epic as items are completed)   03/29/2012  Patient/family provided with [facility] Department of Clinical Social Work's list of facilities offering this level of care within the geographic area requested by the patient (or if unable, by the patient's family).  03/29/2012  Patient/family informed of their freedom to choose among providers that offer the needed level of care, that participate in Medicare, Medicaid or managed care program needed by the patient, have an available bed and are willing to accept the patient.    Patient/family informed of [facility]' ownership interest in [facility], as well as of the fact that they are under no obligation to receive care at this facility.  PASARR submitted to EDS on 03/29/2012 PASARR number received from EDS on   FL2 transmitted to all facilities in geographic area requested by pt/family on   FL2 transmitted to all facilities within larger geographic area on   Patient informed that his/her managed care company has contracts with or will negotiate with  certain facilities, including the following:     Patient/family informed of bed offers received:   Patient chooses bed at  Physician recommends and patient chooses bed at    Patient to be transferred to  on   Patient to be transferred to facility by   The following physician request were entered in Epic:   Additional Comments:  Cori Razor LCSW [phone]

## 2012-03-29 NOTE — H&P (Signed)
TOTAL HIP ADMISSION H&P  Patient is admitted for right total hip arthroplasty.  Subjective:  Chief Complaint: right hip pain  HPI: Rebecca Lowery, 72 y.o. female, has a history of pain and functional disability in the right hip(s) due to arthritis and patient has failed non-surgical conservative treatments for greater than 12 weeks to include NSAID's and/or analgesics, use of assistive devices, weight reduction as appropriate and activity modification.  Onset of symptoms was gradual starting 6 years ago with gradually worsening course since that time.The patient noted no past surgery on the right hip(s).  Patient currently rates pain in the right hip at 10 out of 10 with activity. Patient has night pain, worsening of pain with activity and weight bearing, pain that interfers with activities of daily living, pain with passive range of motion and crepitus. Patient has evidence of subchondral cysts, subchondral sclerosis, periarticular osteophytes and joint space narrowing by imaging studies. This condition presents safety issues increasing the risk of falls.  There is no current active infection.  Patient Active Problem List   Diagnosis Date Noted  . Degenerative arthritis of hip 03/29/2012   Past Medical History  Diagnosis Date  . Insomnia   . Anxiety   . Depression   . Chest pain     HX OF CHEST PAIN --NUCLEAR STRESS TEST 04/19/11 NORMAL--PT STATES SHE WAS TOLD STRESS MAY BE THE CAUSE OF HER CHEST PAINS  . Hypertension   . Sleep apnea     HAS CPAP MACHINE-BUT NO LONGER USES  . Diabetes mellitus without complication     DIET CONTROLLED  . Heart murmur     MVP - PT THINKS HER CHEST PAIN IS RELATED TO HER MVP  . GERD (gastroesophageal reflux disease)   . Cancer 1991    LEFT MASTECTOMY FOR BREAST CANCER--PT NOT SURE IF SHE IS TO AVOID NEEDLES LEFT ARM-DID HAVE AXILLARY LYMPH NODES REMOVED  . Arthritis     OA BOTH HIPS, KNEES AND BACK.  SEVER PAIN IN RIGHT HIP  . Family history of  anesthesia complication     PT'S SON HAD ANESTHESIA PROBLEM WITH EXCISION OF WISDOM TEETH ---THRASING AROUND--BUT DID GO HOME SAME DAY OF PROCEDURE    Past Surgical History  Procedure Laterality Date  . Tonsillectomy      AS A TEENAGER  . Cholecystectomy    . Breast surgery      LEFT MASTECTOMY AND AXILLARY DISSECTION FOR BREAST CANCER  . Abdominal hysterectomy      Prescriptions prior to admission  Medication Sig Dispense Refill  . ARIPiprazole (ABILIFY) 2 MG tablet Take 2 mg by mouth daily.      . beta carotene w/minerals (OCUVITE) tablet Take 1 tablet by mouth daily.      Marland Kitchen buPROPion (WELLBUTRIN SR) 150 MG 12 hr tablet Take 150 mg by mouth 3 (three) times daily.      . cholecalciferol (VITAMIN D) 1000 UNITS tablet Take 1,000 Units by mouth daily.      . Cranberry-Vitamin C-Vitamin E (CRANBERRY PLUS VITAMIN C) 4200-20-3 MG-MG-UNIT CAPS Take 1 capsule by mouth daily.      . diclofenac (VOLTAREN) 50 MG EC tablet Take 50 mg by mouth 2 (two) times daily.      Marland Kitchen HYDROcodone-acetaminophen (NORCO/VICODIN) 5-325 MG per tablet Take 1 tablet by mouth every 6 (six) hours as needed for pain.      Marland Kitchen LORazepam (ATIVAN) 0.5 MG tablet Take 0.5 mg by mouth 2 (two) times daily.      Marland Kitchen  LOSARTAN POTASSIUM PO Take 100 mg by mouth daily. IN AM      . Melatonin 3 MG TABS Take 1 tablet by mouth at bedtime.       . Multiple Vitamin (MULTIVITAMIN WITH MINERALS) TABS Take 1 tablet by mouth daily.      Marland Kitchen omeprazole (PRILOSEC OTC) 20 MG tablet Take 20 mg by mouth at bedtime.       . sulfamethoxazole-trimethoprim (BACTRIM DS) 800-160 MG per tablet Take 1 tablet by mouth 2 (two) times daily.      Marland Kitchen zolpidem (AMBIEN) 10 MG tablet Take 20 mg by mouth at bedtime.        Allergies  Allergen Reactions  . Ciprofloxacin Hives    REACTION UNKNOWN  . Quinolones Hives    History  Substance Use Topics  . Smoking status: Never Smoker   . Smokeless tobacco: Never Used  . Alcohol Use: Yes     Comment: OCCAS GLASS OF  WINE    History reviewed. No pertinent family history.   Review of Systems  Musculoskeletal: Positive for joint pain.  All other systems reviewed and are negative.    Objective:  Physical Exam  Constitutional: She is oriented to person, place, and time. She appears well-developed and well-nourished.  HENT:  Head: Normocephalic and atraumatic.  Eyes: EOM are normal. Pupils are equal, round, and reactive to light.  Neck: Normal range of motion. Neck supple.  Cardiovascular: Normal rate and regular rhythm.   Respiratory: Effort normal and breath sounds normal.  GI: Soft. Bowel sounds are normal.  Musculoskeletal:       Right hip: She exhibits decreased range of motion, decreased strength, bony tenderness and crepitus.  Neurological: She is alert and oriented to person, place, and time.  Skin: Skin is warm and dry.  Psychiatric: She has a normal mood and affect.    Vital signs in last 24 hours: Temp:  [97 F (36.1 C)] 97 F (36.1 C) (03/07 0738) Pulse Rate:  [93] 93 (03/07 0738) Resp:  [16] 16 (03/07 0738) BP: (134)/(74) 134/74 mmHg (03/07 0738) SpO2:  [98 %] 98 % (03/07 0738)  Labs:   There is no weight on file to calculate BMI.   Imaging Review Plain radiographs demonstrate severe degenerative joint disease of the right hip(s). The bone quality appears to be good for age and reported activity level.  Assessment/Plan:  End stage arthritis, right hip(s)  The patient history, physical examination, clinical judgement of the provider and imaging studies are consistent with end stage degenerative joint disease of the right hip(s) and total hip arthroplasty is deemed medically necessary. The treatment options including medical management, injection therapy, arthroscopy and arthroplasty were discussed at length. The risks and benefits of total hip arthroplasty were presented and reviewed. The risks due to aseptic loosening, infection, stiffness, dislocation/subluxation,   thromboembolic complications and other imponderables were discussed.  The patient acknowledged the explanation, agreed to proceed with the plan and consent was signed. Patient is being admitted for inpatient treatment for surgery, pain control, PT, OT, prophylactic antibiotics, VTE prophylaxis, progressive ambulation and ADL's and discharge planning.The patient is planning to be discharged home with home health services

## 2012-03-29 NOTE — Progress Notes (Signed)

## 2012-03-29 NOTE — Brief Op Note (Signed)
03/29/2012  12:24 PM  PATIENT:  Rebecca Lowery  72 y.o. female  PRE-OPERATIVE DIAGNOSIS:  Right hip severe arthritis  POST-OPERATIVE DIAGNOSIS:  Right hip severe arthritis  PROCEDURE:  Procedure(s): Right TOTAL HIP ARTHROPLASTY ANTERIOR APPROACH (Right)  SURGEON:  Surgeon(s) and Role:    * Kathryne Hitch, MD - Primary  PHYSICIAN ASSISTANT:   ASSISTANTS: Rexene Edison, PA-C   ANESTHESIA:   spinal  EBL:  Total I/O In: 1000 [I.V.:1000] Out: 425 [Blood:425]  BLOOD ADMINISTERED:none  DRAINS: none   LOCAL MEDICATIONS USED:  NONE  SPECIMEN:  No Specimen  DISPOSITION OF SPECIMEN:  N/A  COUNTS:  YES  TOURNIQUET:  * No tourniquets in log *  DICTATION: .Other Dictation: Dictation Number [id number]  PLAN OF CARE: Admit to inpatient   PATIENT DISPOSITION:  PACU - hemodynamically stable.   Delay start of Pharmacological VTE agent (>24hrs) due to surgical blood loss or risk of bleeding: no

## 2012-03-29 NOTE — Progress Notes (Signed)
Clinical Social Work Department BRIEF PSYCHOSOCIAL ASSESSMENT 03/29/2012  Patient:  Rebecca Lowery, Rebecca Lowery     Account Number:  [id number]     Admit date:  03/29/2012  Clinical Social Worker:  Candie Chroman  Date/Time:  03/29/2012 03:11 PM  Referred by:  Physician  Date Referred:  03/29/2012 Referred for  SNF Placement   Other Referral:   Interview type:   Other interview type:    PSYCHOSOCIAL DATA Living Status:  ALONE Admitted from facility:   Level of care:   Primary support name:  [name] Primary support relationship to patient:  SPOUSE Degree of support available:   limited    CURRENT CONCERNS Current Concerns  Post-Acute Placement   Other Concerns:    SOCIAL WORK ASSESSMENT / PLAN Pt is a 72 year old female living at home prior to hospitalization. CSW met with pt/son to assist with d/c planning. Pt's spouse was recently admitted to [facility] for SNF placement. Pt will need ST Rehab following hospital d/c and [facility] spouse. CSW has initaited SNF search and has contacted [facility] at pt's request . Awaiting response from SNF. CSW has begun [facility] insurance authorization for ST SNF placement. PT / OT evals are needed. CSW will continue to follow to assist with SNF placement.   Assessment/plan status:  Psychosocial Support/Ongoing Assessment of Needs Other assessment/ plan:   Information/referral to community resources:   SNF list provided.    PATIENT'S/FAMILY'S RESPONSE TO PLAN OF CARE: Pt is unable to manage at home alone following hip surgery. She would like ST SNF placement with spouse at [place].   Cori Razor LCSW [phone]

## 2012-03-29 NOTE — Anesthesia Postprocedure Evaluation (Signed)
  Anesthesia Post-op Note  Patient: Rebecca Lowery  Procedure(s) Performed: Procedure(s) (LRB): Right TOTAL HIP ARTHROPLASTY ANTERIOR APPROACH (Right)  Patient Location: PACU  Anesthesia Type: Spinal  Level of Consciousness: awake and alert   Airway and Oxygen Therapy: Patient Spontanous Breathing  Post-op Pain: mild  Post-op Assessment: Post-op Vital signs reviewed, Patient's Cardiovascular Status Stable, Respiratory Function Stable, Patent Airway and No signs of Nausea or vomiting  Last Vitals:  Filed Vitals:   03/29/12 1600  BP: 134/71  Pulse: 87  Temp: 36.5 C  Resp: 14    Post-op Vital Signs: stable   Complications: No apparent anesthesia complications

## 2012-03-29 NOTE — Plan of Care (Signed)
Problem: Consults Goal: Diagnosis- Total Joint Replacement Primary Total Hip     

## 2012-03-29 NOTE — Anesthesia Procedure Notes (Addendum)
Spinal   Spinal  Patient location during procedure: OR Start time: 03/29/2012 10:41 AM End time: 03/29/2012 10:49 AM Staffing Anesthesiologist: Phillips Grout CRNA/Resident: EVANS, JANET E Preanesthetic Checklist Completed: patient identified, site marked, surgical consent, pre-op evaluation, timeout performed, IV checked, risks and benefits discussed and monitors and equipment checked Spinal Block Patient position: sitting Prep: Betadine Patient monitoring: continuous pulse ox and blood pressure Approach: right paramedian Location: L2-3 Injection technique: single-shot Needle Needle gauge: 22 G Needle length: 9 cm Additional Notes Pt tolerated procedure well. CSF flow x 3 . No paresthesia , no heme.Lot [id number], exp3/2015.

## 2012-03-29 NOTE — Anesthesia Preprocedure Evaluation (Signed)
Anesthesia Evaluation  Patient identified by MRN, date of birth, ID band Patient awake    Reviewed: Allergy & Precautions, H&P , NPO status , Patient's Chart, lab work & pertinent test results  Airway Mallampati: II TM Distance: >3 FB Neck ROM: Full    Dental no notable dental hx. (+) Partial Upper   Pulmonary neg pulmonary ROS, sleep apnea and Continuous Positive Airway Pressure Ventilation ,  breath sounds clear to auscultation  Pulmonary exam normal       Cardiovascular hypertension, Pt. on medications negative cardio ROS  + Valvular Problems/Murmurs MVP Rhythm:Regular Rate:Normal     Neuro/Psych PSYCHIATRIC DISORDERS Anxiety Depression negative neurological ROS  negative psych ROS   GI/Hepatic negative GI ROS, Neg liver ROS, GERD-  Medicated and Controlled,  Endo/Other  negative endocrine ROSdiabetes  Renal/GU negative Renal ROS  negative genitourinary   Musculoskeletal negative musculoskeletal ROS (+)   Abdominal   Peds negative pediatric ROS (+)  Hematology negative hematology ROS (+)   Anesthesia Other Findings   Reproductive/Obstetrics negative OB ROS                           Anesthesia Physical Anesthesia Plan  ASA: II  Anesthesia Plan: Spinal   Post-op Pain Management:    Induction: Intravenous  Airway Management Planned: Mask  Additional Equipment:   Intra-op Plan:   Post-operative Plan:   Informed Consent: I have reviewed the patients History and Physical, chart, labs and discussed the procedure including the risks, benefits and alternatives for the proposed anesthesia with the patient or authorized representative who has indicated his/her understanding and acceptance.   Dental advisory given  Plan Discussed with: CRNA  Anesthesia Plan Comments:         Anesthesia Quick Evaluation

## 2012-03-29 NOTE — Transfer of Care (Signed)
Immediate Anesthesia Transfer of Care Note  Patient: Rebecca Lowery  Procedure(s) Performed: Procedure(s): Right TOTAL HIP ARTHROPLASTY ANTERIOR APPROACH (Right)  Patient Location: PACU  Anesthesia Type:Spinal  Level of Consciousness: awake, alert , oriented and patient cooperative  Airway & Oxygen Therapy: Patient Spontanous Breathing and Patient connected to face mask oxygen  Post-op Assessment: Report given to PACU RN and Post -op Vital signs reviewed and stable  Post vital signs: stable  Complications: No apparent anesthesia complications  S4 spinal level

## 2012-03-30 LAB — BASIC METABOLIC PANEL
BUN: 16 mg/dL (ref 6–23)
Calcium: 8.8 mg/dL (ref 8.4–10.5)
GFR calc Af Amer: >90 mL/min (ref >90)
GFR calc non Af Amer: 88 mL/min — ABNORMAL LOW (ref >90)
Glucose, Bld: 146 mg/dL — ABNORMAL HIGH (ref 70–99)
Potassium: 4.2 mEq/L (ref 3.5–5.1)
Sodium: 135 mEq/L (ref 135–145)

## 2012-03-30 LAB — CBC
Hemoglobin: 10.4 g/dL — ABNORMAL LOW (ref 12.0–15.0)
MCH: 30.3 pg (ref 26.0–34.0)
Platelets: 161 10*3/uL (ref 150–400)
RBC: 3.43 MIL/uL — ABNORMAL LOW (ref 3.87–5.11)
WBC: 12.7 10*3/uL — ABNORMAL HIGH (ref 4.0–10.5)

## 2012-03-30 NOTE — Op Note (Signed)
Rebecca Lowery, Rebecca Lowery              ACCOUNT NO.:  [id number]  MEDICAL RECORD NO.:  [id number]  LOCATION:  1525                         FACILITY:  [facility]  PHYSICIAN:  Vanita Panda. Magnus Ivan, M.D.DATE OF BIRTH:  25-Oct-1940  DATE OF PROCEDURE:  03/29/2012 DATE OF DISCHARGE:                              OPERATIVE REPORT   PREOPERATIVE DIAGNOSIS:  Severe end-stage arthritis and degenerative joint disease, right hip.  POSTOPERATIVE DIAGNOSIS:  Severe end-stage arthritis and degenerative joint disease, right hip.  PROCEDURE:  Right total hip arthroplasty through direct anterior approach.  IMPLANTS:  DePuy Sector Gription acetabular component size 50, size 32+ 4 neutral polyethylene liner, size 11 Corail  femoral component with standard offset (KA), size 32+ 1 ceramic hip ball.  SURGEON:  Vanita Panda. Magnus Ivan, M.D.  ASSISTANT:  Richardean Canal, PA-C, who assisted and participated throughout the entire case and whose assistance was integral in positioning of the patient, exposure of the right hip joint, placement of the components, and closure of the wound.  ANESTHESIA:  Spinal.  ANTIBIOTICS:  2 g IV Ancef.  ESTIMATED BLOOD LOSS:  Less than 500 mL.  COMPLICATIONS:  None.  INDICATIONS:  Rebecca Lowery is a very pleasant 71 year old I have seen for a while now.  She has severe end-stage arthritis of her right hip with x- ray showing subchondral sclerosis, cystic changes, joint space narrowing, periarticular osteophytes.  It has gotten to where she has a Trendelenburg gait.  She has night pain.  She has pain with activities of daily living, it has been daily.  Her mobility is limited.  She has tried activity modification, and assist device and now given the impact in her life, she wished to proceed with a total hip arthroplasty.  The risks of acute blood loss anemia, DVT, infection, PE, and fracture had been explained to her and the goals are improved mobility and decreased pain, as  well as improved quality of life.  PROCEDURE DESCRIPTION:  After informed consent was obtained, appropriate right hip was marked.  She was brought to the operating room.  Spinal anesthesia was obtained while she was on her bed.  She was then placed in a supine position on the stretcher.  A Foley catheter was placed and then traction boots were placed on both of her feet.  She was then placed supine on the Hana fracture table with perineal post in place and both legs in inline skeletal traction and no traction applied.  Her right hip was then assessed under direct fluoroscopy and we also assess the center of the pelvis for leg length measurements.  We then prepped the right hip with DuraPrep and sterile drapes.  C-arm was prepped as well.  A time-out was called to identify the correct patient, correct right hip.  We then made an incision just distal and posterior to the anterior-superior iliac spine and carried this obliquely down the leg. We dissected down to the tensor fascia lata.  The tensor fascia was divided longitudinally.  I then proceeded with a direct anterior approach to the hip.  A Cobra retractor was placed around the lateral neck and up underneath the rectus femoris, a medial retractor was placed.  I cauterized  the lateral femoral circumflex vessels and then opened up the hip capsule in a L-type format placing the Cobra retractors within the hip capsule.  I then used oscillating saw and made my femoral neck cut just proximal to the lesser trochanter with an oscillating saw and finished this off with an osteotome.  We then placed a corkscrew guide in the femoral head and removed the femoral head in its entirety, and found it to be devoid of cartilage, hard as a rock, and even loose bodies within the hip joint.  I then cleaned the acetabular debris, placed a bent Hohmann medially and a Cobra retractor laterally and began reaming from a size 44 reamer in 2 mm increments up to a  size 50.  All reamers placed under direct visualization and the last reamer was placed under direct fluoroscopy, regaining my depth of reaming, my inclination and version.  I then placed the real DePuy Sector Gription acetabular component size 50, and apex hole eliminator guide.  I placed the neutral 32+ 4 neutral polyethylene liner. Attention was then turned to the femur.  With the leg externally rotated to 90 degrees, extended and adducted.  We gained access to the femoral canal after Mueller retractor was placed medially and a bent Hohmann laterally at least the lateral capsule, used a box cutting guide to open up the femoral canal.  I then began broaching from a size 8 broach up to a size 11.  The size 11 was felt to be stable, so I trialed a 32+ 1 hip ball.  We brought the leg back up and over with it traction and internal rotation reduced the hip, it was stable with internal and external rotation with minimal shuck.  Her leg lengths were measured equal under direct fluoroscopy.  We then dislocated the hip and removed the trial components.  I placed the real Corail femoral component, size 11, with standard offset and the real 32+ 1 ceramic hip ball.  I reduced this back in the acetabulum and it was stable.  We then used pulsatile lavage to irrigate the joint.  We closed the joint capsule with interrupted #1 Ethibond suture followed by running 0 V-Loc in the tensor fascia, 2-0 Vicryl in the deep and subcutaneous tissue, 4-0 Monocryl subcuticular stitch, and Dermabond on the skin, and well-padded Aquacel dressing. She was taken off the Hana table into the recovery room in stable condition.  All final counts correct.  There were no complications noted.  Again, of note, Serena Croissant, PA-C's  assistance was valued and needed throughout the case, especially for the integral portions of the case.     Vanita Panda. Magnus Ivan, M.D.     CYB/MEDQ  D:  03/29/2012  T:  03/30/2012  Job:   [id number]

## 2012-03-30 NOTE — Evaluation (Signed)
Occupational Therapy Evaluation Patient Details Name: Rebecca Lowery MRN: [id number] DOB: April 24, 1940 Today's Date: 03/30/2012 Time: 4782-9562 OT Time Calculation (min): 20 min  OT Assessment / Plan / Recommendation Clinical Impression  This 72 year old female was admitted for R  anterior direct THA.  She will benefit from skilled OT to increase independence with adls with min A to min guard level goals in acute    OT Assessment  Patient needs continued OT Services    Follow Up Recommendations  SNF    Barriers to Discharge      Equipment Recommendations  3 in 1 bedside comode    Recommendations for Other Services    Frequency  Min 2X/week    Precautions / Restrictions Precautions Precautions: Fall Restrictions Other Position/Activity Restrictions: WBAT   Pertinent Vitals/Pain 4/10 R hip ; 7/10 back.  Repositioned.  BP 128/51 standing   ADL  Grooming: Set up Where Assessed - Grooming: Unsupported sitting Upper Body Bathing: Set up Where Assessed - Upper Body Bathing: Unsupported sitting Lower Body Bathing: Simulated;Moderate assistance (without ae) Where Assessed - Lower Body Bathing: Supported sit to stand Upper Body Dressing: Minimal assistance (iv) Where Assessed - Upper Body Dressing: Unsupported sitting Lower Body Dressing: Maximal assistance Where Assessed - Lower Body Dressing: Supported sit to stand Toilet Transfer: Performed;Minimal assistance (from commode, ambulated to bed) Toilet Transfer Method: Sit to Barista: Bedside commode Toileting - Clothing Manipulation and Hygiene: Maximal assistance Where Assessed - Engineer, mining and Hygiene: Standing Equipment Used: Rolling walker Transfers/Ambulation Related to ADLs: tolerated ambulating out to hall and back to room (see PT note).  Pt had mild dizziness ADL Comments: Pt does have AE at home and has used it for adls in the past.    OT Diagnosis: Generalized weakness  OT  Problem List: Decreased strength;Decreased activity tolerance;Decreased knowledge of use of DME or AE;Cardiopulmonary status limiting activity;Pain OT Treatment Interventions: Self-care/ADL training;DME and/or AE instruction;Patient/family education   OT Goals Acute Rehab OT Goals OT Goal Formulation: With patient Time For Goal Achievement: 04/06/12 Potential to Achieve Goals: Good ADL Goals Pt Will Perform Grooming: with supervision;Standing at sink ADL Goal: Grooming - Progress: Goal set today Pt Will Perform Lower Body Bathing: with min assist;Sit to stand from chair;with adaptive equipment ADL Goal: Lower Body Bathing - Progress: Goal set today Pt Will Perform Lower Body Dressing: with min assist;Sit to stand from chair;with adaptive equipment ADL Goal: Lower Body Dressing - Progress: Goal set today Pt Will Transfer to Toilet: with min assist;Ambulation;3-in-1 (min guard) ADL Goal: Toilet Transfer - Progress: Goal set today Pt Will Perform Toileting - Hygiene: with min assist;Sit to stand from 3-in-1/toilet (min guard) ADL Goal: Toileting - Hygiene - Progress: Goal set today  Visit Information  Last OT Received On: 03/30/12 Assistance Needed: +1 PT/OT Co-Evaluation/Treatment: Yes    Subjective Data  Subjective: My back hurts more than my hip.  I'm a little dizzy Patient Stated Goal: rehab then home.  Husband is currently in rehab   Prior Functioning     Home Living Lives With: Spouse Available Help at Discharge:  (in rehab at [facility]) Additional Comments: Pt would like to go to rehab at [facility] - spouse is currently there Prior Function Level of Independence: Independent;Independent with assistive device(s) Driving: Yes Communication Communication: No difficulties Dominant Hand: Right         Vision/Perception     Cognition  Cognition Overall Cognitive Status: Appears within functional limits for tasks assessed/performed  Arousal/Alertness:  Awake/alert Orientation Level: Appears intact for tasks assessed Behavior During Session: [facility] for tasks performed    Extremity/Trunk Assessment Right Upper Extremity Assessment RUE ROM/Strength/Tone:  (strength grossly 3+/5 shoulders, bil) Left Upper Extremity Assessment LUE ROM/Strength/Tone: WFL for tasks assessed     Mobility Bed Mobility Sit to Supine: 3: Mod assist Transfers Sit to Stand: 4: Min assist;From elevated surface;From chair/3-in-1;With armrests     Exercise     Balance     End of Session OT - End of Session Activity Tolerance: Patient limited by pain Patient left: in bed;with call bell/phone within reach  GO     [facility] 03/30/2012, 2:53 PM Marica Otter, OTR/L [phone] 03/30/2012

## 2012-03-30 NOTE — Progress Notes (Signed)
Subjective: 1 Day Post-Op Procedure(s) (LRB): Right TOTAL HIP ARTHROPLASTY ANTERIOR APPROACH (Right) Patient reports pain as moderate.  Asymptomatic acute blood loss anemia.  Objective: Vital signs in last 24 hours: Temp:  [97.5 F (36.4 C)-99.4 F (37.4 C)] 98.3 F (36.8 C) (03/08 0557) Pulse Rate:  [72-90] 87 (03/08 0557) Resp:  [12-18] 18 (03/08 0557) BP: (104-135)/(54-76) 104/67 mmHg (03/08 0557) SpO2:  [99 %-100 %] 100 % (03/08 0557) Weight:  [77.1 kg (169 lb 15.6 oz)] 77.1 kg (169 lb 15.6 oz) (03/07 1359)  Intake/Output from previous day: 03/07 0701 - 03/08 0700 In: 3797.5 [P.O.:480; I.V.:3317.5] Out: 2500 [Urine:2025; Blood:475] Intake/Output this shift:     Recent Labs  03/30/12 0536  HGB 10.4*    Recent Labs  03/30/12 0536  WBC 12.7*  RBC 3.43*  HCT 31.4*  PLT 161    Recent Labs  03/30/12 0536  NA 135  K 4.2  CL 100  CO2 30  BUN 16  CREATININE 0.64  GLUCOSE 146*  CALCIUM 8.8   No results found for this basename: LABPT, INR,  in the last 72 hours  Sensation intact distally Intact pulses distally Dorsiflexion/Plantar flexion intact Incision: dressing C/D/I  Assessment/Plan: 1 Day Post-Op Procedure(s) (LRB): Right TOTAL HIP ARTHROPLASTY ANTERIOR APPROACH (Right) Up with therapy ST-SNF likely Monday.  BLACKMAN,CHRISTOPHER Y 03/30/2012, 9:53 AM

## 2012-03-30 NOTE — Evaluation (Signed)
Physical Therapy Evaluation Patient Details Name: Rebecca Lowery MRN: [id number] DOB: 03/04/1940 Today's Date: 03/30/2012 Time: 7829-5621 PT Time Calculation (min): 36 min  PT Assessment / Plan / Recommendation Clinical Impression  Pt s/p R THR presents with decreased R LE strength/ROM and post op pain limiting functional mobilty    PT Assessment  Patient needs continued PT services    Follow Up Recommendations  SNF    Does the patient have the potential to tolerate intense rehabilitation      Barriers to Discharge Decreased caregiver support      Equipment Recommendations  None recommended by PT    Recommendations for Other Services OT consult   Frequency 7X/week    Precautions / Restrictions Precautions Precautions: Fall Restrictions Weight Bearing Restrictions: No Other Position/Activity Restrictions: WBAT   Pertinent Vitals/Pain 5/10, premed, ice packs provided      Mobility  Bed Mobility Bed Mobility: Supine to Sit Supine to Sit: 1: +2 Total assist Supine to Sit: Patient Percentage: 60% Details for Bed Mobility Assistance: cues for sequence and use of UEs and L LE to self assist Transfers Transfers: Sit to Stand;Stand to Sit Sit to Stand: 1: +2 Total assist;From bed;With armrests;From chair/3-in-1 Sit to Stand: Patient Percentage: 60% Stand to Sit: 1: +2 Total assist;To chair/3-in-1;With armrests;With upper extremity assist Stand to Sit: Patient Percentage: 70% Stand Pivot Transfers: 1: +2 Total assist Stand Pivot Transfers: Patient Percentage: 70% Details for Transfer Assistance: cues for LE management and use of UEs to self assist Ambulation/Gait Ambulation/Gait Assistance: 1: +2 Total assist Ambulation/Gait: Patient Percentage: 70% Ambulation Distance (Feet): 36 Feet Assistive device: Rolling walker Ambulation/Gait Assistance Details: cues for posture, sequence, position from RW and to increase BOS Gait Pattern: Step-to pattern;Decreased step length  - right;Decreased step length - left;Trunk flexed    Exercises Total Joint Exercises Ankle Circles/Pumps: AROM;15 reps;Supine;Both Quad Sets: AROM;10 reps;Both;Supine Heel Slides: AAROM;10 reps;Supine;Right Hip ABduction/ADduction: AAROM;Right;10 reps;Supine   PT Diagnosis: Difficulty walking  PT Problem List: Decreased strength;Decreased range of motion;Decreased activity tolerance;Decreased mobility;Decreased knowledge of use of DME;Pain PT Treatment Interventions: DME instruction;Gait training;Functional mobility training;Therapeutic activities;Therapeutic exercise;Patient/family education   PT Goals Acute Rehab PT Goals PT Goal Formulation: With patient Time For Goal Achievement: 04/03/12 Potential to Achieve Goals: Good Pt will go Supine/Side to Sit: with supervision PT Goal: Supine/Side to Sit - Progress: Goal set today Pt will go Sit to Supine/Side: with supervision PT Goal: Sit to Supine/Side - Progress: Goal set today Pt will go Sit to Stand: with supervision PT Goal: Sit to Stand - Progress: Goal set today Pt will go Stand to Sit: with supervision PT Goal: Stand to Sit - Progress: Goal set today Pt will Ambulate: 51 - 150 feet;with supervision;with rolling walker PT Goal: Ambulate - Progress: Goal set today  Visit Information  Last PT Received On: 03/30/12 Assistance Needed: +2    Subjective Data  Subjective: My other hip is not great either Patient Stated Goal: Rehab and home to resume previous lifestyle with decreased pain   Prior Functioning  Home Living Lives With: Spouse (In rehab currently at [facility]) Additional Comments: Pt would like to go to rehab at [facility] - spouse is currently there Prior Function Level of Independence: Independent;Independent with assistive device(s) Able to Take Stairs?: Yes Driving: Yes Vocation: Retired Musician: No difficulties Dominant Hand: Right    Cognition  Cognition Overall Cognitive Status:  Appears within functional limits for tasks assessed/performed Arousal/Alertness: Awake/alert Orientation Level: Appears intact for tasks assessed Behavior  During Session: [facility] for tasks performed    Extremity/Trunk Assessment Right Upper Extremity Assessment RUE ROM/Strength/Tone: [facility] for tasks assessed Left Upper Extremity Assessment LUE ROM/Strength/Tone: [facility] for tasks assessed Right Lower Extremity Assessment RLE ROM/Strength/Tone: Deficits RLE ROM/Strength/Tone Deficits: Hip strength 2+/5 wtih AAROM to 75 flex and 20 abd Left Lower Extremity Assessment LLE ROM/Strength/Tone: Deficits LLE ROM/Strength/Tone Deficits: Hip strength WFL with AAROM at hip to 80 flex and 25 abd   Balance    End of Session PT - End of Session Equipment Utilized During Treatment: Gait belt Activity Tolerance: Patient tolerated treatment well Patient left: in chair;with call bell/phone within reach Nurse Communication: Mobility status  GP     BRADSHAW,HUNTER 03/30/2012, 12:41 PM

## 2012-03-30 NOTE — Progress Notes (Signed)
Physical Therapy Treatment Patient Details Name: Rebecca Lowery MRN: [id number] DOB: 1940-06-03 Today's Date: 03/30/2012 Time: 6213-0865 PT Time Calculation (min): 18 min  PT Assessment / Plan / Recommendation Comments on Treatment Session       Follow Up Recommendations  SNF     Does the patient have the potential to tolerate intense rehabilitation     Barriers to Discharge        Equipment Recommendations  None recommended by PT    Recommendations for Other Services OT consult  Frequency 7X/week   Plan Discharge plan remains appropriate    Precautions / Restrictions Precautions Precautions: Fall Restrictions Weight Bearing Restrictions: No Other Position/Activity Restrictions: WBAT   Pertinent Vitals/Pain     Mobility  Bed Mobility Bed Mobility: Sit to Supine Sit to Supine: 3: Mod assist Details for Bed Mobility Assistance: cues for sequence and use of UEs and L LE to self assist Transfers Transfers: Sit to Stand;Stand to Sit Sit to Stand: 4: Min assist;From elevated surface;From chair/3-in-1;With armrests Stand to Sit: 4: Min assist;3: Mod assist Details for Transfer Assistance: cues for LE management and use of UEs to self assist Ambulation/Gait Ambulation/Gait Assistance: 4: Min assist;3: Mod assist Ambulation Distance (Feet): 44 Feet Assistive device: Rolling walker Ambulation/Gait Assistance Details: cues for sequence, posture, and position from RW Gait Pattern: Step-to pattern;Decreased step length - right;Decreased step length - left;Trunk flexed General Gait Details: ltd by fatigue and c/o being lightheaded - BP 121/52    Exercises     PT Diagnosis:    PT Problem List:   PT Treatment Interventions:     PT Goals Acute Rehab PT Goals PT Goal Formulation: With patient Time For Goal Achievement: 04/03/12 Potential to Achieve Goals: Good Pt will go Supine/Side to Sit: with supervision PT Goal: Supine/Side to Sit - Progress: Goal set today Pt will  go Sit to Supine/Side: with supervision PT Goal: Sit to Supine/Side - Progress: Goal set today Pt will go Sit to Stand: with supervision PT Goal: Sit to Stand - Progress: Goal set today Pt will go Stand to Sit: with supervision PT Goal: Stand to Sit - Progress: Goal set today Pt will Ambulate: 51 - 150 feet;with supervision;with rolling walker PT Goal: Ambulate - Progress: Goal set today  Visit Information  Last PT Received On: 03/30/12 Assistance Needed: +1 PT/OT Co-Evaluation/Treatment: Yes    Subjective Data  Subjective: Sometimes I get a little lightheaded at home Patient Stated Goal: Rehab and home to resume previous lifestyle with decreased pain   Cognition  Cognition Overall Cognitive Status: Appears within functional limits for tasks assessed/performed Arousal/Alertness: Awake/alert Orientation Level: Appears intact for tasks assessed Behavior During Session: [facility] for tasks performed    Balance     End of Session PT - End of Session Equipment Utilized During Treatment: Gait belt Activity Tolerance: Patient tolerated treatment well;Patient limited by fatigue Patient left: in bed;with call bell/phone within reach Nurse Communication: Mobility status   GP     Rebecca Lowery,Rebecca Lowery 03/30/2012, 4:44 PM

## 2012-03-31 LAB — CBC
HCT: 28.2 % — ABNORMAL LOW (ref 36.0–46.0)
Hemoglobin: 9.6 g/dL — ABNORMAL LOW (ref 12.0–15.0)
MCH: 30.9 pg (ref 26.0–34.0)
MCHC: 34 g/dL (ref 30.0–36.0)
RDW: 13.4 % (ref 11.5–15.5)

## 2012-03-31 MED ORDER — FERROUS SULFATE 325 (65 FE) MG PO TABS: 325.0000 mg | ORAL_TABLET | Freq: Three times a day (TID) | ORAL | Status: DC

## 2012-03-31 MED ORDER — RIVAROXABAN 10 MG PO TABS: 10.0000 mg | ORAL_TABLET | Freq: Every day | ORAL | Status: DC

## 2012-03-31 MED ORDER — TRAMADOL HCL 50 MG PO TABS
50.0000 mg | ORAL_TABLET | Freq: Four times a day (QID) | ORAL | Status: DC | PRN
  Administered 2012-04-01: 100 mg via ORAL
  Filled 2012-03-31: qty 2

## 2012-03-31 MED ORDER — SULFAMETHOXAZOLE-TMP DS 800-160 MG PO TABS: 1.0000 | ORAL_TABLET | Freq: Two times a day (BID) | ORAL | Status: DC

## 2012-03-31 MED ORDER — OXYCODONE HCL 5 MG PO TABS: 5.0000 mg | ORAL_TABLET | ORAL | Status: DC | PRN

## 2012-03-31 MED ORDER — METHOCARBAMOL 500 MG PO TABS: 500.0000 mg | ORAL_TABLET | Freq: Four times a day (QID) | ORAL | Status: DC | PRN

## 2012-03-31 MED ORDER — POLYETHYLENE GLYCOL 3350 17 G PO PACK
17.0000 g | PACK | Freq: Every day | ORAL | Status: DC | PRN
  Administered 2012-03-31: 17 g via ORAL
  Filled 2012-03-31: qty 1

## 2012-03-31 NOTE — Progress Notes (Signed)
Subjective: 2 Days Post-Op Procedure(s) (LRB): Right TOTAL HIP ARTHROPLASTY ANTERIOR APPROACH (Right) Patient reports pain as mild.  Some light-headedness.  Vitals stable.  Does have acute blood loss anemia. Working with PT  Objective: Vital signs in last 24 hours: Temp:  [98.4 F (36.9 C)-100.5 F (38.1 C)] 98.9 F (37.2 C) (03/09 0545) Pulse Rate:  [94-107] 94 (03/09 0545) Resp:  [15-18] 16 (03/09 0545) BP: (119-137)/(63-69) 137/69 mmHg (03/09 0545) SpO2:  [96 %-98 %] 96 % (03/09 0545)  Intake/Output from previous day: 03/08 0701 - 03/09 0700 In: 879.2 [P.O.:480; I.V.:399.2] Out: 1350 [Urine:1350] Intake/Output this shift: Total I/O In: -  Out: 250 [Urine:250]   Recent Labs  03/30/12 0536 03/31/12 0427  HGB 10.4* 9.6*    Recent Labs  03/30/12 0536 03/31/12 0427  WBC 12.7* 18.0*  RBC 3.43* 3.11*  HCT 31.4* 28.2*  PLT 161 149*    Recent Labs  03/30/12 0536  NA 135  K 4.2  CL 100  CO2 30  BUN 16  CREATININE 0.64  GLUCOSE 146*  CALCIUM 8.8   No results found for this basename: LABPT, INR,  in the last 72 hours  Sensation intact distally Intact pulses distally Dorsiflexion/Plantar flexion intact Incision: dressing C/D/I  Assessment/Plan: 2 Days Post-Op Procedure(s) (LRB): Right TOTAL HIP ARTHROPLASTY ANTERIOR APPROACH (Right) Plan for discharge tomorrow to SNF if continues to improve   BLACKMAN,CHRISTOPHER Y 03/31/2012, 9:43 AM

## 2012-03-31 NOTE — Progress Notes (Signed)
Physical Therapy Treatment Patient Details Name: Rebecca Lowery MRN: [id number] DOB: November 21, 1940 Today's Date: 03/31/2012 Time: 6644-0347 PT Time Calculation (min): 24 min  PT Assessment / Plan / Recommendation Comments on Treatment Session  [name]. She walked 12' with RW, distance limited by fatigue.     Follow Up Recommendations  SNF     Does the patient have the potential to tolerate intense rehabilitation     Barriers to Discharge        Equipment Recommendations  None recommended by PT    Recommendations for Other Services OT consult  Frequency 7X/week   Plan Discharge plan remains appropriate    Precautions / Restrictions Precautions Precautions: Fall Restrictions Weight Bearing Restrictions: No Other Position/Activity Restrictions: WBAT   Pertinent Vitals/Pain *6/10 R hip Premedicated, ice applied**    Mobility  Bed Mobility Bed Mobility: Not assessed Transfers Transfers: Sit to Stand;Stand to Sit Sit to Stand: 4: Min assist;From elevated surface;From chair/3-in-1;With armrests Sit to Stand: Patient Percentage: 90% Stand to Sit: 5: Supervision;With upper extremity assist;To chair/3-in-1;With armrests Details for Transfer Assistance: cues for LE management and use of UEs to self assist Ambulation/Gait Ambulation/Gait Assistance: 4: Min guard Ambulation Distance (Feet): 35 Feet Assistive device: Rolling walker Gait Pattern: Step-to pattern;Trunk flexed Gait velocity: decreased General Gait Details: VCs to increase step length    Exercises Total Joint Exercises Ankle Circles/Pumps: AROM;15 reps;Supine;Both Quad Sets: AROM;10 reps;Both Short Arc Quad: AROM;Right;15 reps Heel Slides: AAROM;Supine;Right;10 reps Hip ABduction/ADduction: AAROM;Right;Supine;10 reps   PT Diagnosis:    PT Problem List:   PT Treatment Interventions:     PT Goals Acute Rehab PT Goals PT Goal Formulation: With patient Time For Goal Achievement:  04/03/12 Potential to Achieve Goals: Good Pt will go Supine/Side to Sit: with supervision Pt will go Sit to Supine/Side: with supervision Pt will go Sit to Stand: with supervision PT Goal: Sit to Stand - Progress: Progressing toward goal Pt will go Stand to Sit: with supervision PT Goal: Stand to Sit - Progress: Progressing toward goal Pt will Ambulate: 51 - 150 feet;with supervision;with rolling walker PT Goal: Ambulate - Progress: Progressing toward goal  Visit Information  Last PT Received On: 03/31/12 Assistance Needed: +1    Subjective Data  Subjective: I'm hurting more, they cut back the pain medicine.  Patient Stated Goal: Rehab and home to resume previous lifestyle with decreased pain   Cognition       Balance     End of Session PT - End of Session Equipment Utilized During Treatment: Gait belt Activity Tolerance: Patient tolerated treatment well;Patient limited by fatigue Patient left: with call bell/phone within reach;in chair Nurse Communication: Mobility status   GP     Ralene Bathe Kistler 03/31/2012, 3:13 PM [phone]

## 2012-03-31 NOTE — Progress Notes (Signed)
UR completed 

## 2012-03-31 NOTE — Progress Notes (Signed)
Physical Therapy Treatment Patient Details Name: Rebecca Lowery MRN: [id number] DOB: 10-09-1940 Today's Date: 03/31/2012 Time: 3086-5784 PT Time Calculation (min): 24 min  PT Assessment / Plan / Recommendation Comments on Treatment Session  [name]. She walked 25' with RW, distance limited by lightheadedness and fatigue.     Follow Up Recommendations  SNF     Does the patient have the potential to tolerate intense rehabilitation     Barriers to Discharge        Equipment Recommendations  None recommended by PT    Recommendations for Other Services OT consult  Frequency 7X/week   Plan Discharge plan remains appropriate    Precautions / Restrictions Precautions Precautions: Fall Restrictions Weight Bearing Restrictions: No Other Position/Activity Restrictions: WBAT   Pertinent Vitals/Pain *3/10 R hip Premedicated, ice applied**    Mobility  Bed Mobility Bed Mobility: Sit to Supine Sit to Supine: 4: Min assist Details for Bed Mobility Assistance: assist for RLE into bed Transfers Transfers: Sit to Stand;Stand to Sit Sit to Stand: 4: Min assist;From elevated surface;From chair/3-in-1;With armrests Sit to Stand: Patient Percentage: 90% Stand to Sit: 4: Min assist Stand to Sit: Patient Percentage: 90% Stand Pivot Transfers: 4: Min assist Stand Pivot Transfers: Patient Percentage: 80% Details for Transfer Assistance: cues for LE management and use of UEs to self assist Ambulation/Gait Ambulation/Gait Assistance: 4: Min assist Ambulation/Gait: Patient Percentage: 90% Ambulation Distance (Feet): 50 Feet Assistive device: Rolling walker Ambulation/Gait Assistance Details: distance limited by lightheadedness/fatigue Gait Pattern: Step-to pattern;Decreased step length - right;Decreased step length - left;Trunk flexed General Gait Details: VCs to increase step length    Exercises Total Joint Exercises Ankle Circles/Pumps: AROM;15  reps;Supine;Both Heel Slides: AAROM;10 reps;Supine;Right;15 reps Hip ABduction/ADduction: AAROM;Right;Supine;15 reps Long Arc Quad: AAROM;Right;10 reps;Seated   PT Diagnosis:    PT Problem List:   PT Treatment Interventions:     PT Goals Acute Rehab PT Goals PT Goal Formulation: With patient Time For Goal Achievement: 04/03/12 Potential to Achieve Goals: Good Pt will go Supine/Side to Sit: with supervision Pt will go Sit to Supine/Side: with supervision PT Goal: Sit to Supine/Side - Progress: Progressing toward goal Pt will go Sit to Stand: with supervision PT Goal: Sit to Stand - Progress: Progressing toward goal Pt will go Stand to Sit: with supervision PT Goal: Stand to Sit - Progress: Progressing toward goal Pt will Ambulate: 51 - 150 feet;with supervision;with rolling walker PT Goal: Ambulate - Progress: Progressing toward goal  Visit Information  Last PT Received On: 03/31/12 Assistance Needed: +1    Subjective Data  Subjective: I was awake most of the night. I was lightheaded before surgery.  Patient Stated Goal: Rehab and home to resume previous lifestyle with decreased pain   Cognition  Cognition Overall Cognitive Status: Appears within functional limits for tasks assessed/performed Arousal/Alertness: Awake/alert Orientation Level: Appears intact for tasks assessed Behavior During Session: [facility] for tasks performed    Balance     End of Session PT - End of Session Equipment Utilized During Treatment: Gait belt Activity Tolerance: Patient tolerated treatment well;Patient limited by fatigue Patient left: in bed;with call bell/phone within reach Nurse Communication: Mobility status   GP     Rebecca Lowery 03/31/2012, 9:43 AM [phone]

## 2012-04-01 ENCOUNTER — Encounter (HOSPITAL_COMMUNITY): Payer: Self-pay | Admitting: Orthopaedic Surgery

## 2012-04-01 LAB — CBC
MCH: 29.9 pg (ref 26.0–34.0)
MCV: 90.8 fL (ref 78.0–100.0)
Platelets: 165 10*3/uL (ref 150–400)
RBC: 3.14 MIL/uL — ABNORMAL LOW (ref 3.87–5.11)
RDW: 13.5 % (ref 11.5–15.5)

## 2012-04-01 NOTE — Care Management Note (Signed)
    Page 1 of 1   04/01/2012     8:43:16 AM   CARE MANAGEMENT NOTE 04/01/2012  Patient:  Rebecca Lowery, Rebecca Lowery   Account Number:  [id number]  Date Initiated:  03/31/2012  Documentation initiated by:  [facility]  Subjective/Objective Assessment:   72 year old female admitted s/p hip replacement.     Action/Plan:   SNF when medically ready.   Anticipated DC Date:  04/03/2012   Anticipated DC Plan:  SKILLED NURSING FACILITY  In-house referral  Clinical Social Worker      DC Planning Services  CM consult      Choice offered to / List presented to:             Status of service:  Completed, signed off Medicare Important Message given?  NA - LOS <3 / Initial given by admissions (If response is "NO", the following Medicare IM given date fields will be blank) Date Medicare IM given:   Date Additional Medicare IM given:    Discharge Disposition:  SKILLED NURSING FACILITY  Per UR Regulation:  Reviewed for med. necessity/level of care/duration of stay  If discussed at Long Length of Stay Meetings, dates discussed:    Comments:

## 2012-04-01 NOTE — Discharge Summary (Signed)
Patient ID: Rebecca Lowery MRN: [id number] DOB/AGE: 1940/06/11 72 y.o.  Admit date: 03/29/2012 Discharge date: 04/01/2012  Admission Diagnoses:  Principal Problem:   Degenerative arthritis of hip   Discharge Diagnoses:  Same  Past Medical History  Diagnosis Date  . Insomnia   . Anxiety   . Depression   . Chest pain     HX OF CHEST PAIN --NUCLEAR STRESS TEST 04/19/11 NORMAL--PT STATES SHE WAS TOLD STRESS MAY BE THE CAUSE OF HER CHEST PAINS  . Hypertension   . Sleep apnea     HAS CPAP MACHINE-BUT NO LONGER USES  . Diabetes mellitus without complication     DIET CONTROLLED  . Heart murmur     MVP - PT THINKS HER CHEST PAIN IS RELATED TO HER MVP  . GERD (gastroesophageal reflux disease)   . Cancer 1991    LEFT MASTECTOMY FOR BREAST CANCER--PT NOT SURE IF SHE IS TO AVOID NEEDLES LEFT ARM-DID HAVE AXILLARY LYMPH NODES REMOVED  . Arthritis     OA BOTH HIPS, KNEES AND BACK.  SEVER PAIN IN RIGHT HIP  . Family history of anesthesia complication     PT'S SON HAD ANESTHESIA PROBLEM WITH EXCISION OF WISDOM TEETH ---THRASING AROUND--BUT DID GO HOME SAME DAY OF PROCEDURE    Surgeries: Procedure(s): Right TOTAL HIP ARTHROPLASTY ANTERIOR APPROACH on 03/29/2012   Consultants:    Discharged Condition: Improved  Hospital Course: ORIE CUTTINO is an 72 y.o. female who was admitted 03/29/2012 for operative treatment ofDegenerative arthritis of hip. Patient has severe unremitting pain that affects sleep, daily activities, and work/hobbies. After pre-op clearance the patient was taken to the operating room on 03/29/2012 and underwent  Procedure(s): Right TOTAL HIP ARTHROPLASTY ANTERIOR APPROACH.    Patient was given perioperative antibiotics: Anti-infectives   Start     Dose/Rate Route Frequency Ordered Stop   03/31/12 1000  sulfamethoxazole-trimethoprim (BACTRIM DS) 800-160 MG per tablet 1 tablet  Status:  Discontinued     1 tablet Oral Every 12 hours 03/31/12 0942 03/31/12 0955   03/31/12  0000  sulfamethoxazole-trimethoprim (BACTRIM DS) 800-160 MG per tablet     1 tablet Oral 2 times daily 03/31/12 2203     03/29/12 1700  ceFAZolin (ANCEF) IVPB 1 g/50 mL premix     1 g 100 mL/hr over 30 Minutes Intravenous Every 6 hours 03/29/12 1414 03/29/12 2247   03/29/12 1500  sulfamethoxazole-trimethoprim (BACTRIM DS) 800-160 MG per tablet 1 tablet     1 tablet Oral 2 times daily 03/29/12 1414     03/29/12 0740  ceFAZolin (ANCEF) IVPB 2 g/50 mL premix     2 g 100 mL/hr over 30 Minutes Intravenous On call to O.R. 03/29/12 0740 03/29/12 1036       Patient was given sequential compression devices, early ambulation, and chemoprophylaxis to prevent DVT.  Patient benefited maximally from hospital stay and there were no complications.    Recent vital signs: Patient Vitals for the past 24 hrs:  BP Temp Temp src Pulse Resp SpO2  04/01/12 0515 132/68 mmHg 97.9 F (36.6 C) Oral 93 18 94 %  03/31/12 2145 122/72 mmHg 99 F (37.2 C) Oral 101 18 99 %  03/31/12 1434 123/71 mmHg 99.6 F (37.6 C) Oral 95 16 96 %     Recent laboratory studies:  Recent Labs  03/30/12 0536 03/31/12 0427 04/01/12 0412  WBC 12.7* 18.0* 18.3*  HGB 10.4* 9.6* 9.4*  HCT 31.4* 28.2* 28.5*  PLT 161 149* 165  NA 135  --   --   K 4.2  --   --   CL 100  --   --   CO2 30  --   --   BUN 16  --   --   CREATININE 0.64  --   --   GLUCOSE 146*  --   --   CALCIUM 8.8  --   --      Discharge Medications:     Medication List    STOP taking these medications       HYDROcodone-acetaminophen 5-325 MG per tablet  Commonly known as:  NORCO/VICODIN      TAKE these medications       ARIPiprazole 2 MG tablet  Commonly known as:  ABILIFY  Take 2 mg by mouth daily.     beta carotene w/minerals tablet  Take 1 tablet by mouth daily.     buPROPion 150 MG 12 hr tablet  Commonly known as:  WELLBUTRIN SR  Take 150 mg by mouth 3 (three) times daily.     cholecalciferol 1000 UNITS tablet  Commonly known as:   VITAMIN D  Take 1,000 Units by mouth daily.     CRANBERRY PLUS VITAMIN C 4200-20-3 MG-MG-UNIT Caps  Generic drug:  Cranberry-Vitamin C-Vitamin E  Take 1 capsule by mouth daily.     diclofenac 50 MG EC tablet  Commonly known as:  VOLTAREN  Take 50 mg by mouth 2 (two) times daily.     ferrous sulfate 325 (65 FE) MG tablet  Take 1 tablet (325 mg total) by mouth 3 (three) times daily after meals.     LORazepam 0.5 MG tablet  Commonly known as:  ATIVAN  Take 0.5 mg by mouth 2 (two) times daily.     LOSARTAN POTASSIUM PO  Take 100 mg by mouth daily. IN AM     Melatonin 3 MG Tabs  Take 1 tablet by mouth at bedtime.     methocarbamol 500 MG tablet  Commonly known as:  ROBAXIN  Take 1 tablet (500 mg total) by mouth every 6 (six) hours as needed.     multivitamin with minerals Tabs  Take 1 tablet by mouth daily.     omeprazole 20 MG tablet  Commonly known as:  PRILOSEC OTC  Take 20 mg by mouth at bedtime.     oxyCODONE 5 MG immediate release tablet  Commonly known as:  Oxy IR/ROXICODONE  Take 1-2 tablets (5-10 mg total) by mouth every 3 (three) hours as needed.     rivaroxaban 10 MG Tabs tablet  Commonly known as:  XARELTO  Take 1 tablet (10 mg total) by mouth daily with breakfast.     sulfamethoxazole-trimethoprim 800-160 MG per tablet  Commonly known as:  BACTRIM DS  Take 1 tablet by mouth 2 (two) times daily.     zolpidem 10 MG tablet  Commonly known as:  AMBIEN  Take 20 mg by mouth at bedtime.        Diagnostic Studies: Dg Hip Complete Right  03/29/2012  *RADIOLOGY REPORT*  Clinical Data: Right total hip arthroplasty  DG C-ARM 61-120 MIN - NRPT [facility],RIGHT HIP - COMPLETE 2+ VIEW  Comparison: None.  Findings: Spot fluoroscopic intraoperative views demonstrate right total hip arthroplasty.  Components appear aligned in the frontal plane.  No definite osseous or hardware abnormality.  Visualized lower pelvis intact.  IMPRESSION: Expected appearance status post right hip  arthroplasty   Original Report Authenticated By: Judie Petit. Miles Costain, M.D.  Dg Pelvis Portable  03/29/2012  *RADIOLOGY REPORT*  Clinical Data: Right hip arthroplasty, postoperative exam  PORTABLE PELVIS  Comparison: 03/29/2012  Findings: Portable supine pelvis view obtained.  Right hip total arthroplasty has been performed.  Components appear aligned in the frontal plane.  Postop changes of the soft tissues.  Advanced left hip degenerative change as well.  Bony pelvis appears intact.  No acute osseous abnormality or hardware abnormality.  IMPRESSION: Expected appearance status post right total hip arthroplasty.   Original Report Authenticated By: Judie Petit. Shick, M.D.    Dg Hip Portable 1 View Right  03/29/2012  *RADIOLOGY REPORT*  Clinical Data: Right hip arthroplasty, postoperative exam  PORTABLE RIGHT HIP - 1 VIEW  Comparison: 03/29/2012  Findings: Portable cross-table lateral view obtained.  Components appear aligned in the lateral plane.  Limited exam because of technique and overlying soft tissues.  No definite hardware abnormality or malalignment.  IMPRESSION: Expected appearance status post right hip arthroplasty.  Components aligned in the lateral plane.   Original Report Authenticated By: Judie Petit. Shick, M.D.    Dg C-arm 61-120 Min-no Report  03/29/2012  *RADIOLOGY REPORT*  Clinical Data: Right total hip arthroplasty  DG C-ARM 61-120 MIN - NRPT [facility],RIGHT HIP - COMPLETE 2+ VIEW  Comparison: None.  Findings: Spot fluoroscopic intraoperative views demonstrate right total hip arthroplasty.  Components appear aligned in the frontal plane.  No definite osseous or hardware abnormality.  Visualized lower pelvis intact.  IMPRESSION: Expected appearance status post right hip arthroplasty   Original Report Authenticated By: Judie Petit. Miles Costain, M.D.     Disposition:  To skilled nursing facility      Discharge Orders   Future Orders Complete By Expires     Call MD / Call 911  As directed     Comments:      If you experience chest  pain or shortness of breath, CALL 911 and be transported to the hospital emergency room.  If you develope a fever above 101 F, pus (white drainage) or increased drainage or redness at the wound, or calf pain, call your surgeon's office.    Constipation Prevention  As directed     Comments:      Drink plenty of fluids.  Prune juice may be helpful.  You may use a stool softener, such as Colace (over the counter) 100 mg twice a day.  Use MiraLax (over the counter) for constipation as needed.    Diet - low sodium heart healthy  As directed     Discharge patient  As directed     Discharge wound care:  As directed     Comments:      Keep dressing clean dry and intact until Wednesday then remove and shower    Increase activity slowly as tolerated  As directed     Weight bearing as tolerated  As directed     Comments:      Weight bearing as tolerated right leg       Follow-up Information   Follow up with Kathryne Hitch, MD. Call in 2 weeks.   Contact information:   [address] [place] [address] [phone]        Signed: Kathryne Hitch 04/01/2012, 7:34 AM

## 2012-04-01 NOTE — Progress Notes (Signed)
Clinical Social Work Department CLINICAL SOCIAL WORK PLACEMENT NOTE 04/01/2012  Patient:  Rebecca Lowery, Rebecca Lowery  Account Number:  [id number] Admit date:  03/29/2012  Clinical Social Worker:  Cori Razor, LCSW  Date/time:  03/29/2012 03:25 PM  Clinical Social Work is seeking post-discharge placement for this patient at the following level of care:   SKILLED NURSING   (*CSW will update this form in Epic as items are completed)   03/29/2012  Patient/family provided with [facility] Department of Clinical Social Work's list of facilities offering this level of care within the geographic area requested by the patient (or if unable, by the patient's family).  03/29/2012  Patient/family informed of their freedom to choose among providers that offer the needed level of care, that participate in Medicare, Medicaid or managed care program needed by the patient, have an available bed and are willing to accept the patient.    Patient/family informed of [facility]' ownership interest in [facility], as well as of the fact that they are under no obligation to receive care at this facility.  PASARR submitted to EDS on 03/29/2012 PASARR number received from [employer] on 04/01/2012  FL2 transmitted to all facilities in geographic area requested by pt/family on  03/29/2012 FL2 transmitted to all facilities within larger geographic area on   Patient informed that his/her managed care company has contracts with or will negotiate with  certain facilities, including the following:     Patient/family informed of bed offers received:  03/29/2012 Patient chooses bed at [facility] Physician recommends and patient chooses bed at    Patient to be transferred to [facility] on  04/01/2012 Patient to be transferred to facility by [facility]  The following physician request were entered in Epic:   Additional Comments:[facility] insurance provided prior approval for  SNF placement.  Cori Razor LCSW [phone]

## 2012-04-01 NOTE — Progress Notes (Signed)
Subjective: 3 Days Post-Op Procedure(s) (LRB): Right TOTAL HIP ARTHROPLASTY ANTERIOR APPROACH (Right) Patient reports pain as moderate.  Still with asymptomatic acute blood loss anemia.  Objective: Vital signs in last 24 hours: Temp:  [97.9 F (36.6 C)-99.6 F (37.6 C)] 97.9 F (36.6 C) (03/10 0515) Pulse Rate:  [93-101] 93 (03/10 0515) Resp:  [16-18] 18 (03/10 0515) BP: (122-132)/(68-72) 132/68 mmHg (03/10 0515) SpO2:  [94 %-99 %] 94 % (03/10 0515)  Intake/Output from previous day: 03/09 0701 - 03/10 0700 In: 720 [P.O.:720] Out: 1250 [Urine:1250] Intake/Output this shift:     Recent Labs  03/30/12 0536 03/31/12 0427 04/01/12 0412  HGB 10.4* 9.6* 9.4*    Recent Labs  03/31/12 0427 04/01/12 0412  WBC 18.0* 18.3*  RBC 3.11* 3.14*  HCT 28.2* 28.5*  PLT 149* 165    Recent Labs  03/30/12 0536  NA 135  K 4.2  CL 100  CO2 30  BUN 16  CREATININE 0.64  GLUCOSE 146*  CALCIUM 8.8   No results found for this basename: LABPT, INR,  in the last 72 hours  Sensation intact distally Intact pulses distally Dorsiflexion/Plantar flexion intact Incision: dressing C/D/I  Assessment/Plan: 3 Days Post-Op Procedure(s) (LRB): Right TOTAL HIP ARTHROPLASTY ANTERIOR APPROACH (Right) Discharge to SNF  BLACKMAN,CHRISTOPHER Y 04/01/2012, 7:33 AM

## 2012-04-02 ENCOUNTER — Non-Acute Institutional Stay: Payer: Self-pay | Admitting: Family Medicine

## 2012-04-02 NOTE — Progress Notes (Unsigned)
[facility] History and Physical  Rebecca Lowery is an 71 y.o. female.    Hoyle Sauer, MD   HPI: Rebecca Lowery is a 72 year old female with PMHx of OA, severe in right hip who presents to [facility] s/p total right hip replacement (surgery on 03/29/12).  She says she is here for a short term stay for physical therapy and plans to return home in about 2 weeks. She went to physical therapy today and says her pain went up to an 8/10 but she did not ask for any extra pain medication.  She says her appetite is OK, she is not having any problems with urination, and she had a bowel movement this morning.    Past Medical History  Diagnosis Date  . Insomnia   . Anxiety   . Depression   . Chest pain     HX OF CHEST PAIN --NUCLEAR STRESS TEST 04/19/11 NORMAL--PT STATES SHE WAS TOLD STRESS MAY BE THE CAUSE OF HER CHEST PAINS  . Hypertension   . Sleep apnea     HAS CPAP MACHINE-BUT NO LONGER USES  . Diabetes mellitus without complication     DIET CONTROLLED  . Heart murmur     MVP - PT THINKS HER CHEST PAIN IS RELATED TO HER MVP  . GERD (gastroesophageal reflux disease)   . Cancer 1991    LEFT MASTECTOMY FOR BREAST CANCER--PT NOT SURE IF SHE IS TO AVOID NEEDLES LEFT ARM-DID HAVE AXILLARY LYMPH NODES REMOVED  . Arthritis     OA BOTH HIPS, KNEES AND BACK.  SEVER PAIN IN RIGHT HIP  . Family history of anesthesia complication     PT'S SON HAD ANESTHESIA PROBLEM WITH EXCISION OF WISDOM TEETH ---THRASING AROUND--BUT DID GO HOME SAME DAY OF PROCEDURE    Past Surgical History  Procedure Laterality Date  . Tonsillectomy      AS A TEENAGER  . Cholecystectomy    . Breast surgery      LEFT MASTECTOMY AND AXILLARY DISSECTION FOR BREAST CANCER  . Abdominal hysterectomy    . Total hip arthroplasty Right 03/29/2012    Procedure: Right TOTAL HIP ARTHROPLASTY ANTERIOR APPROACH;  Surgeon: Kathryne Hitch, MD;  Location: [facility];  Service: Orthopedics;  Laterality: Right;    Social History:   reports that she has never smoked. She has never used smokeless tobacco. She reports that  drinks alcohol. She reports that she does not use illicit drugs. Lives at home with husband, who is also currently at [facility] for physical rehabilitation.   Allergies:  Allergies  Allergen Reactions  . Ciprofloxacin Hives    REACTION UNKNOWN  . Quinolones Hives   Results for orders placed during the hospital encounter of 03/29/12 (from the past 48 hour(s))  CBC     Status: Abnormal   Collection Time    04/01/12  4:12 AM      Result Value Range   WBC 18.3 (*) 4.0 - 10.5 K/uL   RBC 3.14 (*) 3.87 - 5.11 MIL/uL   Hemoglobin 9.4 (*) 12.0 - 15.0 g/dL   HCT 78.2 (*) 95.6 - 21.3 %   MCV 90.8  78.0 - 100.0 fL   MCH 29.9  26.0 - 34.0 pg   MCHC 33.0  30.0 - 36.0 g/dL   RDW 08.6  57.8 - 46.9 %   Platelets 165  150 - 400 K/uL   ROS: Negative except stated in HPI.  Physical Exam:  General appearance: alert, cooperative and  no distress Head: Normocephalic, without obvious abnormality, atraumatic Lungs: clear to auscultation bilaterally Heart: regular rate and rhythm, S1, S2 normal, no murmur, click, rub or gallop Abdomen: soft, non-tender; bowel sounds normal; no masses,  no organomegaly Right Hip: Oclusive dressing in place over incision, no bleeding or drainage noted, she does have surrounding bruising.   Assessment/Plan GLENA PHARRIS is a 72 y.o. female with PMHx of OA, HTN, Diet Controlled DM, Anxiety, insomina, who presents to [facility] s/p Total right hip replacement for rehabilitation:  # S/P total right hip replacement - Pain control with scheduled tylenol and oxycodone PRN - Physical Therapy daily - Dressing to be removed 3/12 - Xarelto for DVT prophylaxis - F/U with Dr. Magnus Ivan at [facility] in two weeks.   # Acute Blood loss Anemia: Pre op labs with hemoglobin of 13.7, discharge hemoglobin 9.4.  - continue Iron supplements TID - monitor for signs of  bleeding - Re-check CBC in 1-2 weeks  # HTN: continue Losartan 100 mg po daily, monitor for signs of over treatment  # Insomina: Patient's home medication list says Ambien 20 mg + melatonin 15 mg- this is a relative contraindication in elderly female, high risk for falls.  Will continue Ambien at 5 mg qhs and continue melatonin.    # Anxiety: Continue Abilify, Wellbutrin, and ativan at current dose.   # FEN/GI: Heart Healthy diet, encourage PO intake  # Code Status:DNR  #Disposition: Pending Physical Therapy plan for discharge home when able to self-care.   CHAMBERLAIN,RACHEL 04/02/2012, 2:34 PM

## 2012-04-03 ENCOUNTER — Encounter: Payer: Self-pay | Admitting: Family Medicine

## 2012-04-03 ENCOUNTER — Non-Acute Institutional Stay: Payer: Self-pay | Admitting: Family Medicine

## 2012-04-03 DIAGNOSIS — F411 Generalized anxiety disorder: Secondary | ICD-10-CM

## 2012-04-03 DIAGNOSIS — I10 Essential (primary) hypertension: Secondary | ICD-10-CM | POA: Insufficient documentation

## 2012-04-03 DIAGNOSIS — G4730 Sleep apnea, unspecified: Secondary | ICD-10-CM

## 2012-04-03 DIAGNOSIS — E119 Type 2 diabetes mellitus without complications: Secondary | ICD-10-CM | POA: Insufficient documentation

## 2012-04-03 DIAGNOSIS — Z96649 Presence of unspecified artificial hip joint: Secondary | ICD-10-CM

## 2012-04-03 DIAGNOSIS — C801 Malignant (primary) neoplasm, unspecified: Secondary | ICD-10-CM

## 2012-04-03 DIAGNOSIS — K219 Gastro-esophageal reflux disease without esophagitis: Secondary | ICD-10-CM | POA: Insufficient documentation

## 2012-04-03 DIAGNOSIS — F32A Depression, unspecified: Secondary | ICD-10-CM

## 2012-04-03 DIAGNOSIS — Z7901 Long term (current) use of anticoagulants: Secondary | ICD-10-CM

## 2012-04-03 DIAGNOSIS — G4700 Insomnia, unspecified: Secondary | ICD-10-CM

## 2012-04-03 DIAGNOSIS — F329 Major depressive disorder, single episode, unspecified: Secondary | ICD-10-CM

## 2012-04-03 MED ORDER — LORAZEPAM 1 MG PO TABS: 1.0000 mg | ORAL_TABLET | Freq: Every day | ORAL | Status: DC

## 2012-04-03 NOTE — Progress Notes (Signed)
Patient ID: Rebecca Lowery, female   DOB: Nov 12, 1940, 72 y.o.   MRN: [id number] I examined this patient and discussed the care plan with Dr Lula Olszewski and the [facility] nursing home team and agree with assessment and plan as documented in the her admission note. I had discussed the fall risk associated with benzodiazepines and Ambien with her yesterday. Today she requests replacing Ambien with a 1 mg dose of Lorazepam at bedtime since the noise in the nursing home keeps her awake.  She is ambulating in the hall independently with a walker and using little of the oxycodone for pain relief. She is anticipating a short rehab stay for herself and her husband.

## 2012-04-11 ENCOUNTER — Non-Acute Institutional Stay (INDEPENDENT_AMBULATORY_CARE_PROVIDER_SITE_OTHER): Payer: Medicare Other | Admitting: Family Medicine

## 2012-04-11 DIAGNOSIS — E119 Type 2 diabetes mellitus without complications: Secondary | ICD-10-CM

## 2012-04-11 DIAGNOSIS — F32A Depression, unspecified: Secondary | ICD-10-CM

## 2012-04-11 DIAGNOSIS — F329 Major depressive disorder, single episode, unspecified: Secondary | ICD-10-CM

## 2012-04-11 DIAGNOSIS — I10 Essential (primary) hypertension: Secondary | ICD-10-CM

## 2012-04-11 DIAGNOSIS — Z96649 Presence of unspecified artificial hip joint: Secondary | ICD-10-CM

## 2012-04-11 NOTE — Progress Notes (Signed)
  Subjective:    Patient ID: Rebecca Lowery, female    DOB: 05-14-1940, 72 y.o.   MRN: [id number]  HPI [facility] Discharge Summary  PCP: Hoyle Sauer, MD   #THR:Patient desires to be discharged from nursing home.  She has reportedly achieved her therapy goals after admission s/p total hip replacement on 03/29/12.  She has been up and ambulating well and physical therapy recommends a rolling front wheel walker and raised toilet as equipment for returning home.  Her pain is well controlled with occasional oxycodone and voltaren.  She denies constipation, abdominal pain, nausea.   She is currently on xarelto for post op chemoprophylaxis for DVT, denies any bleeding.    #Depression with anxiety:  Wellbutrin with abilify was continued during her course at [facility].  She was continued on ativan at home dose with additional dose of ativan added at bedtime prn for sleep. Her Remus Loffler was discontinued during the nursing home course due to increased risk of fall when combined with benzodiazepine.    #HTN: She was continued on losartan with good control of her blood pressures during her nursing home stay.     Review of Systems Per HPI    Objective:   Physical Exam  Constitutional: She is oriented to person, place, and time.  Pleasant, in wheel chair   HENT:  Mouth/Throat: Oropharynx is clear and moist.  Eyes: No scleral icterus.  Cardiovascular: Normal rate, regular rhythm and normal heart sounds.   Pulmonary/Chest: Effort normal and breath sounds normal.  Abdominal: Bowel sounds are normal. She exhibits no distension. There is no tenderness.  Musculoskeletal: She exhibits no edema.  Neurological: She is alert and oriented to person, place, and time.          Assessment & Plan:

## 2012-04-11 NOTE — Patient Instructions (Addendum)
Upon your return home be sure your house of free of anything that may contribute to falls You have a follow up appointment with Dr. Felipa Eth on Friday 3/28 at 11am.  Please call our clinic at [phone] with any questions.

## 2012-04-12 ENCOUNTER — Non-Acute Institutional Stay: Payer: Medicare Other | Admitting: Family Medicine

## 2012-04-12 ENCOUNTER — Encounter: Payer: Self-pay | Admitting: Pharmacist

## 2012-04-12 DIAGNOSIS — E119 Type 2 diabetes mellitus without complications: Secondary | ICD-10-CM

## 2012-04-12 DIAGNOSIS — Z96649 Presence of unspecified artificial hip joint: Secondary | ICD-10-CM

## 2012-04-12 DIAGNOSIS — G4700 Insomnia, unspecified: Secondary | ICD-10-CM

## 2012-04-12 MED ORDER — ROLLER WALKER MISC: Status: DC

## 2012-04-12 MED ORDER — OXYCODONE HCL 5 MG PO TABS: 5.0000 mg | ORAL_TABLET | ORAL | Status: DC | PRN

## 2012-04-12 MED ORDER — RAISED TOILET SEAT MISC: Status: DC

## 2012-04-14 NOTE — Progress Notes (Signed)
  Subjective:    Patient ID: Rebecca Lowery, female    DOB: 05-Jan-1941, 72 y.o.   MRN: [id number]  HPI I interviewed and examined this patient and discussed the discharge plan with Dr Ashley Royalty.     Review of Systems     Objective:   Physical Exam        Assessment & Plan:

## 2012-04-14 NOTE — Assessment & Plan Note (Signed)
BLood pressure well controlled on home dose of losartan.

## 2012-04-14 NOTE — Assessment & Plan Note (Signed)
Progressing through rehabilitation very well. She will be discharged home with rolling walker and raised toilet.  Discussed things to do to minimize falls once she returns home.

## 2012-04-14 NOTE — Assessment & Plan Note (Signed)
I discussed the advantages of minimizing benzodiazepines and non-drug approaches to insomnia with her.

## 2012-04-14 NOTE — Assessment & Plan Note (Signed)
Depression with anxiety and sleep disorder.  She was on ativan  0.5mg  along with ambien at time of admission.  Ambien discontinued and she was given additional 1mg  prn dose of ativan at night for sleep and anxiety in an effort to minimize fall potential. Will continue her 0.5mg  daily dosing at discharge and defer to her pcp as to whether he wants to continue a medication for slepp.

## 2012-04-14 NOTE — Assessment & Plan Note (Signed)
She continues to rehabilitate very well.

## 2012-04-14 NOTE — Assessment & Plan Note (Signed)
Diet controlled.  

## 2012-04-18 NOTE — Consult Note (Signed)
[name], [name]              ACCOUNT NO.:  [id number]  MEDICAL RECORD NO.:  [id number]  LOCATION:  1525                         FACILITY:  [facility]  PHYSICIAN:  Vanita Panda. Magnus Ivan, M.D.DATE OF BIRTH:  [birth year]  DATE OF CONSULTATION: DATE OF DISCHARGE:  04/01/2012                                CONSULTATION   ADDENDUM:  Rebecca Lowery recently underwent a total hip replacement surgery. This was done successfully in early March.  The surgery date was March 29, 2012.  Preoperatively prior to surgery, preoperative labs showed that she had urinary tract infection, so she was started on Bactrim double strength for this.  We continued the Bactrim double strength while she was in the hospital, so she would be on this at total of 5 days to treat her urinary tract infection.     Vanita Panda. Magnus Ivan, M.D.     CYB/MEDQ  D:  04/17/2012  T:  04/18/2012  Job:  [id number]

## 2012-06-19 ENCOUNTER — Other Ambulatory Visit: Payer: Self-pay | Admitting: Internal Medicine

## 2012-06-21 ENCOUNTER — Other Ambulatory Visit: Payer: Self-pay | Admitting: Internal Medicine

## 2012-06-21 DIAGNOSIS — N63 Unspecified lump in unspecified breast: Secondary | ICD-10-CM

## 2012-06-21 DIAGNOSIS — Z9012 Acquired absence of left breast and nipple: Secondary | ICD-10-CM

## 2012-07-03 ENCOUNTER — Ambulatory Visit
Admission: RE | Admit: 2012-07-03 | Discharge: 2012-07-03 | Disposition: A | Discharge status: Home / Self Care | Payer: Medicare Other | Source: Ambulatory Visit | Attending: Internal Medicine | Admitting: Internal Medicine

## 2012-07-03 DIAGNOSIS — Z9012 Acquired absence of left breast and nipple: Secondary | ICD-10-CM

## 2012-07-03 DIAGNOSIS — N63 Unspecified lump in unspecified breast: Secondary | ICD-10-CM

## 2013-01-09 ENCOUNTER — Other Ambulatory Visit (HOSPITAL_COMMUNITY): Payer: Self-pay | Admitting: Orthopaedic Surgery

## 2013-01-20 ENCOUNTER — Encounter (HOSPITAL_COMMUNITY): Payer: Self-pay | Admitting: Pharmacy Technician

## 2013-01-27 ENCOUNTER — Encounter (HOSPITAL_COMMUNITY)
Admission: RE | Admit: 2013-01-27 | Discharge: 2013-01-27 | Disposition: A | Discharge status: Home / Self Care | Payer: Medicare HMO | Source: Ambulatory Visit | Attending: Orthopaedic Surgery | Admitting: Orthopaedic Surgery

## 2013-01-27 ENCOUNTER — Encounter (HOSPITAL_COMMUNITY): Payer: Self-pay

## 2013-01-27 ENCOUNTER — Ambulatory Visit (HOSPITAL_COMMUNITY)
Admission: RE | Admit: 2013-01-27 | Discharge: 2013-01-27 | Disposition: A | Discharge status: Home / Self Care | Payer: Medicare HMO | Source: Ambulatory Visit | Attending: Orthopaedic Surgery | Admitting: Orthopaedic Surgery

## 2013-01-27 DIAGNOSIS — M1610 Unilateral primary osteoarthritis, unspecified hip: Secondary | ICD-10-CM | POA: Insufficient documentation

## 2013-01-27 DIAGNOSIS — Z01812 Encounter for preprocedural laboratory examination: Secondary | ICD-10-CM | POA: Insufficient documentation

## 2013-01-27 DIAGNOSIS — Z01818 Encounter for other preprocedural examination: Secondary | ICD-10-CM | POA: Insufficient documentation

## 2013-01-27 DIAGNOSIS — M169 Osteoarthritis of hip, unspecified: Secondary | ICD-10-CM | POA: Insufficient documentation

## 2013-01-27 HISTORY — DX: Angina pectoris, unspecified: I20.9

## 2013-01-27 LAB — CBC
HCT: 39.3 % (ref 36.0–46.0)
HEMOGLOBIN: 13.4 g/dL (ref 12.0–15.0)
MCH: 30.7 pg (ref 26.0–34.0)
MCHC: 34.1 g/dL (ref 30.0–36.0)
MCV: 89.9 fL (ref 78.0–100.0)
Platelets: 229 10*3/uL (ref 150–400)
RBC: 4.37 MIL/uL (ref 3.87–5.11)
RDW: 14.6 % (ref 11.5–15.5)
WBC: 12.1 10*3/uL — ABNORMAL HIGH (ref 4.0–10.5)

## 2013-01-27 LAB — URINALYSIS, ROUTINE W REFLEX MICROSCOPIC
Bilirubin Urine: NEGATIVE
GLUCOSE, UA: NEGATIVE mg/dL
Ketones, ur: NEGATIVE mg/dL
NITRITE: NEGATIVE
PROTEIN: NEGATIVE mg/dL
Specific Gravity, Urine: 1.015 (ref 1.005–1.030)
UROBILINOGEN UA: 1 mg/dL (ref 0.0–1.0)
pH: 7 (ref 5.0–8.0)

## 2013-01-27 LAB — BASIC METABOLIC PANEL
BUN: 25 mg/dL — ABNORMAL HIGH (ref 6–23)
CO2: 27 mEq/L (ref 19–32)
Calcium: 10.4 mg/dL (ref 8.4–10.5)
Chloride: 100 mEq/L (ref 96–112)
Creatinine, Ser: 0.6 mg/dL (ref 0.50–1.10)
GFR, EST NON AFRICAN AMERICAN: 89 mL/min — AB (ref >90)
Glucose, Bld: 104 mg/dL — ABNORMAL HIGH (ref 70–99)
POTASSIUM: 4.4 meq/L (ref 3.7–5.3)
SODIUM: 138 meq/L (ref 137–147)

## 2013-01-27 LAB — URINE MICROSCOPIC-ADD ON

## 2013-01-27 LAB — PROTIME-INR
INR: 0.94 (ref 0.00–1.49)
Prothrombin Time: 12.4 seconds (ref 11.6–15.2)

## 2013-01-27 LAB — SURGICAL PCR SCREEN
MRSA, PCR: NEGATIVE
Staphylococcus aureus: NEGATIVE

## 2013-01-27 LAB — APTT: aPTT: 30 seconds (ref 24–37)

## 2013-01-27 NOTE — Patient Instructions (Signed)
[place]  01/27/2013   Your procedure is scheduled on: 01/31/13  Report to [facility] at 7:15 AM.  Call this number if you have problems the morning of surgery [phone]   Remember:   Do not eat food or drink liquids After Midnight.     Take these medicines the morning of surgery with A SIP OF WATER: wellbutrin, hydrocodone if needed   Do not wear jewelry, make-up or nail polish.  Do not wear lotions, powders, or perfumes. You may wear deodorant.  Do not shave 48 hours prior to surgery. Men may shave face and neck.  Do not bring valuables to the hospital.  Contacts, dentures or bridgework may not be worn into surgery.  Leave suitcase in the car. After surgery it may be brought to your room.  For patients admitted to the hospital, checkout time is 11:00 AM the day of discharge.   Please read over the following fact sheets that you were given: MRSA Information, blood fact sheet Paulette Blanch, RN  pre op nurse call if needed [phone]    FAILURE TO [place] OF YOUR SURGERY   Patient Signature: ___________________________________________

## 2013-01-27 NOTE — Progress Notes (Signed)
EKG 03/21/12 on EPIC

## 2013-01-31 ENCOUNTER — Encounter (HOSPITAL_COMMUNITY): Payer: Medicare HMO | Admitting: Anesthesiology

## 2013-01-31 ENCOUNTER — Inpatient Hospital Stay (HOSPITAL_COMMUNITY): Payer: Medicare HMO

## 2013-01-31 ENCOUNTER — Inpatient Hospital Stay (HOSPITAL_COMMUNITY): Payer: Medicare HMO | Admitting: Anesthesiology

## 2013-01-31 ENCOUNTER — Inpatient Hospital Stay (HOSPITAL_COMMUNITY)
Admission: RE | Admit: 2013-01-31 | Discharge: 2013-02-04 | DRG: 470 | Disposition: A | Discharge status: Skilled Nursing Facility | Payer: Medicare HMO | Source: Ambulatory Visit | Attending: Orthopaedic Surgery | Admitting: Orthopaedic Surgery

## 2013-01-31 ENCOUNTER — Encounter (HOSPITAL_COMMUNITY)
Admission: RE | Disposition: A | Discharge status: Skilled Nursing Facility | Payer: Self-pay | Source: Ambulatory Visit | Attending: Orthopaedic Surgery

## 2013-01-31 ENCOUNTER — Encounter (HOSPITAL_COMMUNITY): Payer: Self-pay | Admitting: *Deleted

## 2013-01-31 DIAGNOSIS — G4700 Insomnia, unspecified: Secondary | ICD-10-CM | POA: Diagnosis present

## 2013-01-31 DIAGNOSIS — I059 Rheumatic mitral valve disease, unspecified: Secondary | ICD-10-CM | POA: Diagnosis present

## 2013-01-31 DIAGNOSIS — Z96649 Presence of unspecified artificial hip joint: Secondary | ICD-10-CM

## 2013-01-31 DIAGNOSIS — M169 Osteoarthritis of hip, unspecified: Principal | ICD-10-CM | POA: Diagnosis present

## 2013-01-31 DIAGNOSIS — I10 Essential (primary) hypertension: Secondary | ICD-10-CM | POA: Diagnosis present

## 2013-01-31 DIAGNOSIS — K219 Gastro-esophageal reflux disease without esophagitis: Secondary | ICD-10-CM | POA: Diagnosis present

## 2013-01-31 DIAGNOSIS — Z79899 Other long term (current) drug therapy: Secondary | ICD-10-CM

## 2013-01-31 DIAGNOSIS — F3289 Other specified depressive episodes: Secondary | ICD-10-CM | POA: Diagnosis present

## 2013-01-31 DIAGNOSIS — F411 Generalized anxiety disorder: Secondary | ICD-10-CM | POA: Diagnosis present

## 2013-01-31 DIAGNOSIS — E119 Type 2 diabetes mellitus without complications: Secondary | ICD-10-CM | POA: Diagnosis present

## 2013-01-31 DIAGNOSIS — D62 Acute posthemorrhagic anemia: Secondary | ICD-10-CM | POA: Diagnosis not present

## 2013-01-31 DIAGNOSIS — Z853 Personal history of malignant neoplasm of breast: Secondary | ICD-10-CM

## 2013-01-31 DIAGNOSIS — G4730 Sleep apnea, unspecified: Secondary | ICD-10-CM | POA: Diagnosis present

## 2013-01-31 DIAGNOSIS — F329 Major depressive disorder, single episode, unspecified: Secondary | ICD-10-CM | POA: Diagnosis present

## 2013-01-31 DIAGNOSIS — Z9010 Acquired absence of unspecified breast and nipple: Secondary | ICD-10-CM

## 2013-01-31 DIAGNOSIS — M1610 Unilateral primary osteoarthritis, unspecified hip: Principal | ICD-10-CM

## 2013-01-31 HISTORY — PX: TOTAL HIP ARTHROPLASTY: SHX124

## 2013-01-31 SURGERY — ARTHROPLASTY, HIP, TOTAL, ANTERIOR APPROACH
Anesthesia: Spinal | Site: Hip | Laterality: Left

## 2013-01-31 MED ORDER — KETAMINE HCL 10 MG/ML IJ SOLN
INTRAMUSCULAR | Status: DC | PRN
  Administered 2013-01-31: 10 mg via INTRAVENOUS
  Administered 2013-01-31: 20 mg via INTRAVENOUS

## 2013-01-31 MED ORDER — MEPERIDINE HCL 50 MG/ML IJ SOLN: 6.2500 mg | INTRAMUSCULAR | Status: DC | PRN

## 2013-01-31 MED ORDER — PHENOL 1.4 % MT LIQD: 1.0000 | OROMUCOSAL | Status: DC | PRN

## 2013-01-31 MED ORDER — PHENYLEPHRINE HCL 10 MG/ML IJ SOLN
INTRAMUSCULAR | Status: DC | PRN
  Administered 2013-01-31 (×2): 40 ug via INTRAVENOUS
  Administered 2013-01-31: 80 ug via INTRAVENOUS

## 2013-01-31 MED ORDER — METHOCARBAMOL 100 MG/ML IJ SOLN
500.0000 mg | Freq: Four times a day (QID) | INTRAVENOUS | Status: DC | PRN
  Administered 2013-01-31: 500 mg via INTRAVENOUS
  Filled 2013-01-31: qty 5

## 2013-01-31 MED ORDER — LACTATED RINGERS IV SOLN
INTRAVENOUS | Status: DC
  Administered 2013-01-31 (×2): via INTRAVENOUS
  Administered 2013-01-31: 1000 mL via INTRAVENOUS

## 2013-01-31 MED ORDER — PROPOFOL 10 MG/ML IV BOLUS
INTRAVENOUS | Status: DC | PRN
  Administered 2013-01-31 (×3): 20 mg via INTRAVENOUS

## 2013-01-31 MED ORDER — KETAMINE HCL 10 MG/ML IJ SOLN
INTRAMUSCULAR | Status: AC
  Filled 2013-01-31: qty 1

## 2013-01-31 MED ORDER — TRANEXAMIC ACID 100 MG/ML IV SOLN
1000.0000 mg | INTRAVENOUS | Status: AC
  Administered 2013-01-31: 1000 mg via INTRAVENOUS
  Filled 2013-01-31: qty 10

## 2013-01-31 MED ORDER — ASPIRIN EC 325 MG PO TBEC
325.0000 mg | DELAYED_RELEASE_TABLET | Freq: Two times a day (BID) | ORAL | Status: DC
  Administered 2013-02-01 – 2013-02-04 (×7): 325 mg via ORAL
  Filled 2013-01-31 (×9): qty 1

## 2013-01-31 MED ORDER — BUPROPION HCL ER (XL) 300 MG PO TB24
450.0000 mg | ORAL_TABLET | Freq: Every morning | ORAL | Status: DC
  Administered 2013-02-01 – 2013-02-04 (×4): 450 mg via ORAL
  Filled 2013-01-31 (×5): qty 1

## 2013-01-31 MED ORDER — PROPOFOL 10 MG/ML IV BOLUS
INTRAVENOUS | Status: AC
  Filled 2013-01-31: qty 20

## 2013-01-31 MED ORDER — PHENYLEPHRINE 40 MCG/ML (10ML) SYRINGE FOR IV PUSH (FOR BLOOD PRESSURE SUPPORT)
PREFILLED_SYRINGE | INTRAVENOUS | Status: AC
  Filled 2013-01-31: qty 10

## 2013-01-31 MED ORDER — POLYETHYLENE GLYCOL 3350 17 G PO PACK: 17.0000 g | PACK | Freq: Every day | ORAL | Status: DC | PRN

## 2013-01-31 MED ORDER — METOCLOPRAMIDE HCL 5 MG/ML IJ SOLN
5.0000 mg | Freq: Three times a day (TID) | INTRAMUSCULAR | Status: DC | PRN
Start: 2013-01-31 — End: 2013-02-04
  Administered 2013-01-31: 17:00:00 10 mg via INTRAVENOUS
  Filled 2013-01-31: qty 2

## 2013-01-31 MED ORDER — ARIPIPRAZOLE 2 MG PO TABS
2.0000 mg | ORAL_TABLET | Freq: Every morning | ORAL | Status: DC
  Administered 2013-02-01: 2 mg via ORAL
  Filled 2013-01-31 (×5): qty 1

## 2013-01-31 MED ORDER — ONDANSETRON HCL 4 MG/2ML IJ SOLN
INTRAMUSCULAR | Status: AC
  Filled 2013-01-31: qty 2

## 2013-01-31 MED ORDER — CLONAZEPAM 1 MG PO TABS
1.0000 mg | ORAL_TABLET | Freq: Every day | ORAL | Status: DC
  Administered 2013-02-02: 22:00:00 1 mg via ORAL
  Filled 2013-01-31 (×2): qty 1

## 2013-01-31 MED ORDER — ONDANSETRON HCL 4 MG/2ML IJ SOLN
4.0000 mg | Freq: Four times a day (QID) | INTRAMUSCULAR | Status: DC | PRN
  Administered 2013-02-01: 09:00:00 4 mg via INTRAVENOUS
  Filled 2013-01-31: qty 2

## 2013-01-31 MED ORDER — BUPIVACAINE HCL (PF) 0.5 % IJ SOLN
INTRAMUSCULAR | Status: AC
  Filled 2013-01-31: qty 30

## 2013-01-31 MED ORDER — HYDROMORPHONE HCL PF 1 MG/ML IJ SOLN
0.2500 mg | INTRAMUSCULAR | Status: DC | PRN
  Administered 2013-01-31: 0.5 mg via INTRAVENOUS

## 2013-01-31 MED ORDER — HYDROMORPHONE HCL PF 1 MG/ML IJ SOLN
1.0000 mg | INTRAMUSCULAR | Status: DC | PRN
  Administered 2013-01-31 – 2013-02-01 (×3): 1 mg via INTRAVENOUS
  Filled 2013-01-31 (×5): qty 1

## 2013-01-31 MED ORDER — PHENYLEPHRINE HCL 10 MG/ML IJ SOLN
10.0000 mg | INTRAVENOUS | Status: DC | PRN
  Administered 2013-01-31: 40 ug/min via INTRAVENOUS

## 2013-01-31 MED ORDER — LOSARTAN POTASSIUM 50 MG PO TABS
100.0000 mg | ORAL_TABLET | Freq: Every day | ORAL | Status: DC
  Administered 2013-02-01 – 2013-02-04 (×4): 100 mg via ORAL
  Filled 2013-01-31 (×5): qty 2

## 2013-01-31 MED ORDER — LORAZEPAM 1 MG PO TABS
1.5000 mg | ORAL_TABLET | Freq: Every day | ORAL | Status: DC
  Administered 2013-01-31 – 2013-02-03 (×4): 1.5 mg via ORAL
  Filled 2013-01-31 (×7): qty 1

## 2013-01-31 MED ORDER — ONDANSETRON HCL 4 MG PO TABS: 4.0000 mg | ORAL_TABLET | Freq: Four times a day (QID) | ORAL | Status: DC | PRN

## 2013-01-31 MED ORDER — LIDOCAINE HCL (CARDIAC) 20 MG/ML IV SOLN
INTRAVENOUS | Status: AC
  Filled 2013-01-31: qty 5

## 2013-01-31 MED ORDER — ZOLPIDEM TARTRATE 5 MG PO TABS: 5.0000 mg | ORAL_TABLET | Freq: Every evening | ORAL | Status: DC | PRN

## 2013-01-31 MED ORDER — DIPHENHYDRAMINE HCL 12.5 MG/5ML PO ELIX: 12.5000 mg | ORAL_SOLUTION | ORAL | Status: DC | PRN

## 2013-01-31 MED ORDER — ONDANSETRON HCL 4 MG/2ML IJ SOLN
INTRAMUSCULAR | Status: DC | PRN
  Administered 2013-01-31: 4 mg via INTRAVENOUS

## 2013-01-31 MED ORDER — SODIUM CHLORIDE 0.9 % IV SOLN
INTRAVENOUS | Status: DC
Start: 2013-01-31 — End: 2013-02-04
  Administered 2013-01-31 – 2013-02-02 (×4): via INTRAVENOUS

## 2013-01-31 MED ORDER — PANTOPRAZOLE SODIUM 40 MG PO TBEC
40.0000 mg | DELAYED_RELEASE_TABLET | Freq: Every day | ORAL | Status: DC
  Administered 2013-01-31 – 2013-02-03 (×4): 40 mg via ORAL
  Filled 2013-01-31 (×7): qty 1

## 2013-01-31 MED ORDER — METOCLOPRAMIDE HCL 10 MG PO TABS
5.0000 mg | ORAL_TABLET | Freq: Three times a day (TID) | ORAL | Status: DC | PRN
Start: 2013-01-31 — End: 2013-02-04

## 2013-01-31 MED ORDER — OXYCODONE HCL 5 MG/5ML PO SOLN
5.0000 mg | Freq: Once | ORAL | Status: DC | PRN
  Filled 2013-01-31: qty 5

## 2013-01-31 MED ORDER — FERROUS SULFATE 325 (65 FE) MG PO TABS
325.0000 mg | ORAL_TABLET | Freq: Three times a day (TID) | ORAL | Status: DC
  Administered 2013-01-31 – 2013-02-04 (×12): 325 mg via ORAL
  Filled 2013-01-31 (×14): qty 1

## 2013-01-31 MED ORDER — OXYCODONE HCL 5 MG PO TABS: 5.0000 mg | ORAL_TABLET | Freq: Once | ORAL | Status: DC | PRN

## 2013-01-31 MED ORDER — LORATADINE 10 MG PO TABS
10.0000 mg | ORAL_TABLET | Freq: Every day | ORAL | Status: DC
  Administered 2013-01-31 – 2013-02-03 (×4): 10 mg via ORAL
  Filled 2013-01-31 (×5): qty 1

## 2013-01-31 MED ORDER — FENTANYL CITRATE 0.05 MG/ML IJ SOLN
INTRAMUSCULAR | Status: AC
  Filled 2013-01-31: qty 2

## 2013-01-31 MED ORDER — ALUM & MAG HYDROXIDE-SIMETH 200-200-20 MG/5ML PO SUSP: 30.0000 mL | ORAL | Status: DC | PRN

## 2013-01-31 MED ORDER — CEFAZOLIN SODIUM-DEXTROSE 2-3 GM-% IV SOLR
2.0000 g | INTRAVENOUS | Status: AC
  Administered 2013-01-31: 2 g via INTRAVENOUS

## 2013-01-31 MED ORDER — CEFAZOLIN SODIUM-DEXTROSE 2-3 GM-% IV SOLR
INTRAVENOUS | Status: AC
  Filled 2013-01-31: qty 50

## 2013-01-31 MED ORDER — FENTANYL CITRATE 0.05 MG/ML IJ SOLN
INTRAMUSCULAR | Status: DC | PRN
  Administered 2013-01-31 (×2): 50 ug via INTRAVENOUS

## 2013-01-31 MED ORDER — OXYCODONE HCL 5 MG PO TABS
5.0000 mg | ORAL_TABLET | ORAL | Status: DC | PRN
  Administered 2013-01-31 (×3): 5 mg via ORAL
  Administered 2013-02-01 – 2013-02-02 (×7): 10 mg via ORAL
  Administered 2013-02-03: 5 mg via ORAL
  Administered 2013-02-03 (×2): 10 mg via ORAL
  Administered 2013-02-03 – 2013-02-04 (×2): 5 mg via ORAL
  Administered 2013-02-04: 10 mg via ORAL
  Administered 2013-02-04: 5 mg via ORAL
  Filled 2013-01-31 (×2): qty 2
  Filled 2013-01-31 (×2): qty 1
  Filled 2013-01-31 (×5): qty 2
  Filled 2013-01-31: qty 1
  Filled 2013-01-31 (×2): qty 2
  Filled 2013-01-31: qty 1
  Filled 2013-01-31 (×2): qty 2
  Filled 2013-01-31: qty 1
  Filled 2013-01-31: qty 2
  Filled 2013-01-31 (×2): qty 1

## 2013-01-31 MED ORDER — OMEPRAZOLE MAGNESIUM 20 MG PO TBEC: 20.0000 mg | DELAYED_RELEASE_TABLET | Freq: Every day | ORAL | Status: DC

## 2013-01-31 MED ORDER — MENTHOL 3 MG MT LOZG: 1.0000 | LOZENGE | OROMUCOSAL | Status: DC | PRN

## 2013-01-31 MED ORDER — HYDROMORPHONE HCL PF 1 MG/ML IJ SOLN
INTRAMUSCULAR | Status: AC
  Administered 2013-01-31: 15:00:00 1 mg
  Filled 2013-01-31: qty 1

## 2013-01-31 MED ORDER — CEFAZOLIN SODIUM 1-5 GM-% IV SOLN
1.0000 g | Freq: Four times a day (QID) | INTRAVENOUS | Status: AC
  Administered 2013-01-31 (×2): 1 g via INTRAVENOUS
  Filled 2013-01-31 (×2): qty 50

## 2013-01-31 MED ORDER — METHOCARBAMOL 500 MG PO TABS
500.0000 mg | ORAL_TABLET | Freq: Four times a day (QID) | ORAL | Status: DC | PRN
  Administered 2013-02-01 – 2013-02-04 (×9): 500 mg via ORAL
  Filled 2013-01-31 (×9): qty 1

## 2013-01-31 MED ORDER — SODIUM CHLORIDE 0.9 % IR SOLN
Status: DC | PRN
  Administered 2013-01-31: 1000 mL

## 2013-01-31 MED ORDER — ACETAMINOPHEN 650 MG RE SUPP: 650.0000 mg | Freq: Four times a day (QID) | RECTAL | Status: DC | PRN

## 2013-01-31 MED ORDER — PROMETHAZINE HCL 25 MG/ML IJ SOLN: 6.2500 mg | INTRAMUSCULAR | Status: DC | PRN

## 2013-01-31 MED ORDER — BUPIVACAINE HCL (PF) 0.5 % IJ SOLN
INTRAMUSCULAR | Status: DC | PRN
  Administered 2013-01-31: 3 mL

## 2013-01-31 MED ORDER — PROPOFOL INFUSION 10 MG/ML OPTIME
INTRAVENOUS | Status: DC | PRN
  Administered 2013-01-31: 75 ug/kg/min via INTRAVENOUS

## 2013-01-31 MED ORDER — DOCUSATE SODIUM 100 MG PO CAPS
100.0000 mg | ORAL_CAPSULE | Freq: Two times a day (BID) | ORAL | Status: DC
  Administered 2013-01-31 – 2013-02-04 (×8): 100 mg via ORAL

## 2013-01-31 MED ORDER — ACETAMINOPHEN 325 MG PO TABS
650.0000 mg | ORAL_TABLET | Freq: Four times a day (QID) | ORAL | Status: DC | PRN
  Administered 2013-02-03: 10:00:00 650 mg via ORAL
  Filled 2013-01-31: qty 2

## 2013-01-31 SURGICAL SUPPLY — 42 items
BAG ZIPLOCK 12X15 (MISCELLANEOUS) IMPLANT
BENZOIN TINCTURE PRP APPL 2/3 (GAUZE/BANDAGES/DRESSINGS) IMPLANT
BLADE SAW SGTL 18X1.27X75 (BLADE) ×2 IMPLANT
BLADE SAW SGTL 18X1.27X75MM (BLADE) ×1
CAPT HIP PF COP ×3 IMPLANT
CELLS DAT CNTRL 66122 CELL SVR (MISCELLANEOUS) ×1 IMPLANT
CLOSURE WOUND 1/2 X4 (GAUZE/BANDAGES/DRESSINGS)
DERMABOND ADVANCED (GAUZE/BANDAGES/DRESSINGS)
DERMABOND ADVANCED .7 DNX12 (GAUZE/BANDAGES/DRESSINGS) IMPLANT
DRAPE C-ARM 42X120 X-RAY (DRAPES) ×3 IMPLANT
DRAPE STERI IOBAN 125X83 (DRAPES) ×3 IMPLANT
DRAPE U-SHAPE 47X51 STRL (DRAPES) ×9 IMPLANT
DRSG AQUACEL AG ADV 3.5X10 (GAUZE/BANDAGES/DRESSINGS) ×3 IMPLANT
DURAPREP 26ML APPLICATOR (WOUND CARE) ×3 IMPLANT
ELECT BLADE TIP CTD 4 INCH (ELECTRODE) ×3 IMPLANT
ELECT REM PT RETURN 9FT ADLT (ELECTROSURGICAL) ×3
ELECTRODE REM PT RTRN 9FT ADLT (ELECTROSURGICAL) ×1 IMPLANT
FACESHIELD LNG OPTICON STERILE (SAFETY) ×12 IMPLANT
GAUZE XEROFORM 5X9 LF (GAUZE/BANDAGES/DRESSINGS) IMPLANT
GLOVE BIO SURGEON STRL SZ7.5 (GLOVE) ×18 IMPLANT
GLOVE BIOGEL PI IND STRL 8 (GLOVE) ×2 IMPLANT
GLOVE BIOGEL PI INDICATOR 8 (GLOVE) ×4
GLOVE ECLIPSE 8.0 STRL XLNG CF (GLOVE) ×3 IMPLANT
GOWN STRL REUS W/TWL XL LVL3 (GOWN DISPOSABLE) ×6 IMPLANT
HANDPIECE INTERPULSE COAX TIP (DISPOSABLE) ×2
KIT BASIN OR (CUSTOM PROCEDURE TRAY) ×3 IMPLANT
PACK TOTAL JOINT (CUSTOM PROCEDURE TRAY) ×3 IMPLANT
PADDING CAST COTTON 6X4 STRL (CAST SUPPLIES) ×3 IMPLANT
RTRCTR WOUND ALEXIS 18CM MED (MISCELLANEOUS) ×3
SET HNDPC FAN SPRY TIP SCT (DISPOSABLE) ×1 IMPLANT
STAPLER VISISTAT 35W (STAPLE) IMPLANT
STRIP CLOSURE SKIN 1/2X4 (GAUZE/BANDAGES/DRESSINGS) IMPLANT
SUT ETHIBOND NAB CT1 #1 30IN (SUTURE) ×3 IMPLANT
SUT ETHILON 3 0 PS 1 (SUTURE) IMPLANT
SUT MNCRL AB 4-0 PS2 18 (SUTURE) ×3 IMPLANT
SUT VIC AB 0 CT1 36 (SUTURE) ×3 IMPLANT
SUT VIC AB 1 CT1 36 (SUTURE) ×3 IMPLANT
SUT VIC AB 2-0 CT1 27 (SUTURE) ×6
SUT VIC AB 2-0 CT1 TAPERPNT 27 (SUTURE) ×3 IMPLANT
TOWEL OR 17X26 10 PK STRL BLUE (TOWEL DISPOSABLE) ×3 IMPLANT
TOWEL OR NON WOVEN STRL DISP B (DISPOSABLE) ×3 IMPLANT
TRAY FOLEY CATH 14FRSI W/METER (CATHETERS) IMPLANT

## 2013-01-31 NOTE — Anesthesia Procedure Notes (Addendum)
Spinal  Patient location during procedure: OR Start time: 01/31/2013 10:18 AM End time: 01/31/2013 10:26 AM Staffing Anesthesiologist: Nickie Retort CRNA/Resident: Anne Fu Performed by: anesthesiologist and resident/CRNA  Preanesthetic Checklist Completed: patient identified, site marked, surgical consent, pre-op evaluation, timeout performed, IV checked, risks and benefits discussed and monitors and equipment checked Spinal Block Patient position: sitting Prep: Betadine Patient monitoring: heart rate, continuous pulse ox and blood pressure Approach: right paramedian Location: L2-3 Injection technique: single-shot Needle Needle type: Spinocan  Needle gauge: 22 G Needle length: 9 cm Assessment Sensory level: T4 Additional Notes  Expiration date of kit checked and confirmed. Patient tolerated procedure well, without complications.  Rebecca Lowery without success, MD [place] X1 noted CSF return easy administration or medication.

## 2013-01-31 NOTE — Progress Notes (Signed)
X-ray results noted 

## 2013-01-31 NOTE — H&P (Signed)
TOTAL HIP ADMISSION H&P  Patient is admitted for left total hip arthroplasty.  Subjective:  Chief Complaint: left hip pain  HPI: Rebecca Lowery, 73 y.o. female, has a history of pain and functional disability in the left hip(s) due to arthritis and patient has failed non-surgical conservative treatments for greater than 12 weeks to include NSAID's and/or analgesics, corticosteriod injections, supervised PT with diminished ADL's post treatment, use of assistive devices and activity modification.  Onset of symptoms was gradual starting 3 years ago with gradually worsening course since that time.The patient noted no past surgery on the left hip(s).  Patient currently rates pain in the left hip at 8 out of 10 with activity. Patient has night pain, worsening of pain with activity and weight bearing, trendelenberg gait, pain that interfers with activities of daily living and pain with passive range of motion. Patient has evidence of subchondral cysts, subchondral sclerosis, periarticular osteophytes and joint space narrowing by imaging studies. This condition presents safety issues increasing the risk of falls.  There is no current active infection.  Patient Active Problem List   Diagnosis Date Noted  . Arthritis pain of left hip 01/31/2013  . Depression 04/03/2012  . Insomnia 04/03/2012  . Generalized anxiety disorder 04/03/2012  . Anticoagulation adequate 04/03/2012  . Hypertension   . Sleep apnea   . Diabetes mellitus without complication   . GERD (gastroesophageal reflux disease)   . History of breast cancer   . Status post THR (total hip replacement) 03/29/2012   Past Medical History  Diagnosis Date  . Insomnia   . Anxiety   . Depression   . Chest pain     HX OF CHEST PAIN --NUCLEAR STRESS TEST 04/19/11 NORMAL--PT STATES SHE WAS TOLD STRESS MAY BE THE CAUSE OF HER CHEST PAINS  . Hypertension   . Sleep apnea     HAS CPAP MACHINE-BUT NO LONGER USES  . Heart murmur     MVP - PT THINKS  HER CHEST PAIN IS RELATED TO HER MVP  . GERD (gastroesophageal reflux disease)   . Cancer 1991    LEFT MASTECTOMY FOR BREAST CANCER--PT NOT SURE IF SHE IS TO AVOID NEEDLES LEFT ARM-DID HAVE AXILLARY LYMPH NODES REMOVED  . Arthritis     OA BOTH HIPS, KNEES AND BACK.  SEVER PAIN IN RIGHT HIP  . Family history of anesthesia complication     PT'S SON HAD ANESTHESIA PROBLEM WITH EXCISION OF WISDOM TEETH ---THRASING AROUND--BUT DID GO HOME SAME DAY OF PROCEDURE  . Anginal pain     09/2012, checked out and was told it was stress    Past Surgical History  Procedure Laterality Date  . Tonsillectomy      AS A TEENAGER  . Cholecystectomy    . Breast surgery      LEFT MASTECTOMY AND AXILLARY DISSECTION FOR BREAST CANCER  . Abdominal hysterectomy    . Total hip arthroplasty Right 03/29/2012    Procedure: Right TOTAL HIP ARTHROPLASTY ANTERIOR APPROACH;  Surgeon: Mcarthur Rossetti, MD;  Location: [facility];  Service: Orthopedics;  Laterality: Right;    Prescriptions prior to admission  Medication Sig Dispense Refill  . ARIPiprazole (ABILIFY) 2 MG tablet Take 2 mg by mouth every morning.       . beta carotene w/minerals (OCUVITE) tablet Take 1 tablet by mouth daily.      Marland Kitchen buPROPion (WELLBUTRIN XL) 150 MG 24 hr tablet Take 450 mg by mouth every morning.      Marland Kitchen  Calcium Carb-Cholecalciferol (CALCIUM 600 + D PO) Take 1 tablet by mouth daily.      . clonazePAM (KLONOPIN) 1 MG tablet Take 1 mg by mouth at bedtime.      Marland Kitchen doxepin (SINEQUAN) 25 MG capsule Take 25 mg by mouth at bedtime as needed (sleep).      Marland Kitchen HYDROcodone-acetaminophen (NORCO/VICODIN) 5-325 MG per tablet Take 1 tablet by mouth every 6 (six) hours as needed for moderate pain.      Marland Kitchen ibuprofen (ADVIL,MOTRIN) 200 MG tablet Take 600 mg by mouth every 8 (eight) hours as needed for mild pain.      Marland Kitchen loratadine (CLARITIN) 10 MG tablet Take 10 mg by mouth at bedtime.      Marland Kitchen LORazepam (ATIVAN) 0.5 MG tablet Take 1.5 mg by mouth at bedtime. Takes 3  tablets at night      . losartan (COZAAR) 100 MG tablet Take 100 mg by mouth daily.       . meloxicam (MOBIC) 15 MG tablet Take 15 mg by mouth as needed for pain.      Marland Kitchen omeprazole (PRILOSEC OTC) 20 MG tablet Take 20 mg by mouth at bedtime.       Marland Kitchen zolpidem (AMBIEN) 10 MG tablet Take 20 mg by mouth at bedtime.        Allergies  Allergen Reactions  . Ciprofloxacin Hives    REACTION UNKNOWN  . Quinolones Hives    History  Substance Use Topics  . Smoking status: Never Smoker   . Smokeless tobacco: Never Used  . Alcohol Use: Yes     Comment: rare glass of wine    History reviewed. No pertinent family history.   Review of Systems  Musculoskeletal: Positive for joint pain.  All other systems reviewed and are negative.    Objective:  Physical Exam  Constitutional: She is oriented to person, place, and time. She appears well-developed and well-nourished.  HENT:  Head: Normocephalic and atraumatic.  Eyes: EOM are normal. Pupils are equal, round, and reactive to light.  Neck: Normal range of motion. Neck supple.  Cardiovascular: Normal rate and regular rhythm.   Respiratory: Effort normal and breath sounds normal.  GI: Soft. Bowel sounds are normal.  Musculoskeletal:       Left hip: She exhibits decreased range of motion, decreased strength and bony tenderness.  Neurological: She is alert and oriented to person, place, and time.  Skin: Skin is warm and dry.  Psychiatric: She has a normal mood and affect.    Vital signs in last 24 hours: Temp:  [97.8 F (36.6 C)] 97.8 F (36.6 C) (01/09 0652) Pulse Rate:  [90] 90 (01/09 0652) Resp:  [18] 18 (01/09 0652) BP: (156)/(56) 156/56 mmHg (01/09 0652) SpO2:  [98 %] 98 % (01/09 0652)  Labs:   Estimated body mass index is 31.09 kg/(m^2) as calculated from the following:   Height as of 01/27/13: 5\' 2"  (1.575 m).   Weight as of 03/21/12: 77.111 kg (170 lb).   Imaging Review Plain radiographs demonstrate severe degenerative joint  disease of the left hip(s). The bone quality appears to be good for age and reported activity level.  Assessment/Plan:  End stage arthritis, left hip(s)  The patient history, physical examination, clinical judgement of the provider and imaging studies are consistent with end stage degenerative joint disease of the left hip(s) and total hip arthroplasty is deemed medically necessary. The treatment options including medical management, injection therapy, arthroscopy and arthroplasty were discussed at length.  The risks and benefits of total hip arthroplasty were presented and reviewed. The risks due to aseptic loosening, infection, stiffness, dislocation/subluxation,  thromboembolic complications and other imponderables were discussed.  The patient acknowledged the explanation, agreed to proceed with the plan and consent was signed. Patient is being admitted for inpatient treatment for surgery, pain control, PT, OT, prophylactic antibiotics, VTE prophylaxis, progressive ambulation and ADL's and discharge planning.The patient is planning to be discharged home with home health services

## 2013-01-31 NOTE — Progress Notes (Signed)
Portable AP Pelvis and Lateral Left Hip X-RAYS done. 

## 2013-01-31 NOTE — Evaluation (Signed)
Physical Therapy Evaluation Patient Details Name: Rebecca Lowery MRN: [id number] DOB: Dec 19, 1940 Today's Date: 01/31/2013 Time: 5056-9794 PT Time Calculation (min): 33 min  PT Assessment / Plan / Recommendation History of Present Illness     Clinical Impression  Pt s/p L THR presents with decreased L LE strength/ROM and post op hip and back pain limiting functional mobility.  Pt would benefit from follow up rehab at SNF level to maximize IND and safety prior to d/c home with limited assist    PT Assessment  Patient needs continued PT services    Follow Up Recommendations  SNF    Does the patient have the potential to tolerate intense rehabilitation      Barriers to Discharge        Equipment Recommendations  None recommended by PT    Recommendations for Other Services OT consult   Frequency 7X/week    Precautions / Restrictions Precautions Precautions: Fall Precaution Comments: Pt reporting significant back pain post op Restrictions Weight Bearing Restrictions: No Other Position/Activity Restrictions: WBAT   Pertinent Vitals/Pain 7/10 hip and back pain; premed, RN aware, ice pack provided      Mobility  Bed Mobility Overal bed mobility: Needs Assistance;+2 for physical assistance Bed Mobility: Supine to Sit;Sit to Supine Supine to sit: +2 for physical assistance;Mod assist Sit to supine: +2 for physical assistance;Mod assist General bed mobility comments: cues for sequence and use of R LE to self assist.  Increased time and utilization of pad under pt for task completion  Transfers Overall transfer level: Needs assistance Equipment used: Rolling walker (2 wheeled) Transfers: Sit to/from Stand Sit to Stand: +2 physical assistance;Mod assist General transfer comment: cues for LE management and use of UEs to self assist Ambulation/Gait Ambulation/Gait assistance: +2 physical assistance;Mod assist Ambulation Distance (Feet): 4 Feet Assistive device: Rolling  walker (2 wheeled) Gait Pattern/deviations: Step-to pattern;Decreased step length - right;Decreased step length - left;Shuffle;Antalgic;Trunk flexed Gait velocity: decr General Gait Details: Cues for sequence, posture, position from RW: Increased time for task completion    Exercises Total Joint Exercises Ankle Circles/Pumps: AROM;Both;10 reps;Supine Quad Sets: AROM;Both;10 reps;Supine Heel Slides: AROM;Left;15 reps;Supine Hip ABduction/ADduction: AAROM;Left;10 reps;Supine   PT Diagnosis: Difficulty walking  PT Problem List: Decreased strength;Decreased range of motion;Decreased activity tolerance;Decreased mobility;Decreased knowledge of use of DME;Pain PT Treatment Interventions: DME instruction;Gait training;Stair training;Functional mobility training;Therapeutic activities;Therapeutic exercise;Patient/family education     PT Goals(Current goals can be found in the care plan section) Acute Rehab PT Goals Patient Stated Goal: Resume previous lifestyle with decreased pain PT Goal Formulation: With patient Time For Goal Achievement: 02/07/13 Potential to Achieve Goals: Good  Visit Information  Last PT Received On: 01/31/13 Assistance Needed: +2       Prior Functioning  Home Living Family/patient expects to be discharged to:: Skilled nursing facility Living Arrangements: Alone Prior Function Level of Independence: Independent;Independent with assistive device(s) Communication Communication: No difficulties Dominant Hand: Right    Cognition  Cognition Arousal/Alertness: Awake/alert Behavior During Therapy: WFL for tasks assessed/performed Overall Cognitive Status: Within Functional Limits for tasks assessed    Extremity/Trunk Assessment Upper Extremity Assessment Upper Extremity Assessment: Overall WFL for tasks assessed Lower Extremity Assessment Lower Extremity Assessment: LLE deficits/detail LLE Deficits / Details: Hip strength 2/5 with AAROM at hip to 70 flex and 10  abd   Balance    End of Session PT - End of Session Equipment Utilized During Treatment: Gait belt Activity Tolerance: Patient limited by pain Patient left: in bed;with call bell/phone within  reach Nurse Communication: Mobility status;Patient requests pain meds  GP     Joscelyne Renville 01/31/2013, 5:36 PM

## 2013-01-31 NOTE — Anesthesia Postprocedure Evaluation (Signed)
Anesthesia Post Note  Patient: Rebecca Lowery  Procedure(s) Performed: Procedure(s) (LRB): LEFT TOTAL HIP ARTHROPLASTY ANTERIOR APPROACH (Left)  Anesthesia type: Spinal  Patient location: PACU  Post pain: Pain level controlled  Post assessment: Post-op Vital signs reviewed  Last Vitals: BP 116/70  Pulse 75  Temp(Src) 36.5 C (Oral)  Resp 16  SpO2 98%  Post vital signs: Reviewed  Level of consciousness: sedated  Complications: No apparent anesthesia complications

## 2013-01-31 NOTE — Transfer of Care (Signed)
Immediate Anesthesia Transfer of Care Note  Patient: Rebecca Lowery  Procedure(s) Performed: Procedure(s) (LRB): LEFT TOTAL HIP ARTHROPLASTY ANTERIOR APPROACH (Left)  Patient Location: PACU  Anesthesia Type: Spinal  Level of Consciousness: sedated, patient cooperative and responds to stimulation  Airway & Oxygen Therapy: Patient Spontanous Breathing and Patient connected to face mask oxgen  Post-op Assessment: Report given to PACU RN and Post -op Vital signs reviewed and stable  Post vital signs: Reviewed and stable, L-3 on exam in PACU without complaint of pain.   Complications: No apparent anesthesia complications

## 2013-01-31 NOTE — Progress Notes (Signed)
Utilization review completed.  

## 2013-01-31 NOTE — Plan of Care (Signed)
Problem: Consults Goal: Diagnosis- Total Joint Replacement Primary Total Hip     

## 2013-01-31 NOTE — Anesthesia Preprocedure Evaluation (Addendum)
Anesthesia Evaluation  Patient identified by MRN, date of birth, ID band Patient awake    Reviewed: Allergy & Precautions, H&P , NPO status , Patient's Chart, lab work & pertinent test results  Airway Mallampati: II TM Distance: >3 FB Neck ROM: Full    Dental no notable dental hx. (+) Partial Upper   Pulmonary neg pulmonary ROS, sleep apnea and Continuous Positive Airway Pressure Ventilation ,  breath sounds clear to auscultation  Pulmonary exam normal       Cardiovascular hypertension, Pt. on medications + angina + Valvular Problems/Murmurs MVP Rhythm:Regular Rate:Normal     Neuro/Psych PSYCHIATRIC DISORDERS Anxiety Depression negative neurological ROS  negative psych ROS   GI/Hepatic negative GI ROS, Neg liver ROS, GERD-  Medicated and Controlled,  Endo/Other  negative endocrine ROSdiabetes  Renal/GU negative Renal ROS     Musculoskeletal negative musculoskeletal ROS (+)   Abdominal   Peds  Hematology negative hematology ROS (+)   Anesthesia Other Findings   Reproductive/Obstetrics negative OB ROS                           Anesthesia Physical  Anesthesia Plan  ASA: II  Anesthesia Plan: Spinal   Post-op Pain Management:    Induction: Intravenous  Airway Management Planned: Mask  Additional Equipment:   Intra-op Plan:   Post-operative Plan:   Informed Consent: I have reviewed the patients History and Physical, chart, labs and discussed the procedure including the risks, benefits and alternatives for the proposed anesthesia with the patient or authorized representative who has indicated his/her understanding and acceptance.   Dental advisory given  Plan Discussed with: CRNA  Anesthesia Plan Comments:         Anesthesia Quick Evaluation

## 2013-01-31 NOTE — Brief Op Note (Signed)
01/31/2013  11:50 AM  PATIENT:  Rebecca Lowery  73 y.o. female  PRE-OPERATIVE DIAGNOSIS:  Left hip osteoarthritis  POST-OPERATIVE DIAGNOSIS:  left hip osteoarthritis  PROCEDURE:  Procedure(s): LEFT TOTAL HIP ARTHROPLASTY ANTERIOR APPROACH (Left)  SURGEON:  Surgeon(s) and Role:    * Mcarthur Rossetti, MD - Primary  PHYSICIAN ASSISTANT: Benita Stabile, PA-C  ANESTHESIA:   spinal  EBL:  Total I/O In: 2000 [I.V.:2000] Out: 600 [Blood:600]  BLOOD ADMINISTERED:none  DRAINS: none   LOCAL MEDICATIONS USED:  NONE  SPECIMEN:  No Specimen  DISPOSITION OF SPECIMEN:  N/A  COUNTS:  YES  TOURNIQUET:  * No tourniquets in log *  DICTATION: .Other Dictation: Dictation Number [id number]  PLAN OF CARE: Admit to inpatient   PATIENT DISPOSITION:  PACU - hemodynamically stable.   Delay start of Pharmacological VTE agent (>24hrs) due to surgical blood loss or risk of bleeding: no

## 2013-02-01 LAB — CBC
HCT: 29 % — ABNORMAL LOW (ref 36.0–46.0)
HEMOGLOBIN: 9.5 g/dL — AB (ref 12.0–15.0)
MCH: 30.4 pg (ref 26.0–34.0)
MCHC: 32.8 g/dL (ref 30.0–36.0)
MCV: 92.7 fL (ref 78.0–100.0)
PLATELETS: 166 10*3/uL (ref 150–400)
RBC: 3.13 MIL/uL — ABNORMAL LOW (ref 3.87–5.11)
RDW: 14.6 % (ref 11.5–15.5)
WBC: 12.2 10*3/uL — ABNORMAL HIGH (ref 4.0–10.5)

## 2013-02-01 LAB — BASIC METABOLIC PANEL
BUN: 17 mg/dL (ref 6–23)
CALCIUM: 8.1 mg/dL — AB (ref 8.4–10.5)
CO2: 27 mEq/L (ref 19–32)
Chloride: 101 mEq/L (ref 96–112)
Creatinine, Ser: 0.53 mg/dL (ref 0.50–1.10)
GFR calc Af Amer: >90 mL/min (ref >90)
GFR calc non Af Amer: >90 mL/min (ref >90)
GLUCOSE: 140 mg/dL — AB (ref 70–99)
POTASSIUM: 4.1 meq/L (ref 3.7–5.3)
SODIUM: 136 meq/L — AB (ref 137–147)

## 2013-02-01 NOTE — Op Note (Signed)
NAMESARITA, Lowery              ACCOUNT NO.:  [id number]  MEDICAL RECORD NO.:  [id number]  LOCATION:  2482                         FACILITY:  [facility]  PHYSICIAN:  Lind Guest. Ninfa Linden, M.D.DATE OF BIRTH:  10/25/1940  DATE OF PROCEDURE:  01/31/2013 DATE OF DISCHARGE:                              OPERATIVE REPORT   PREOPERATIVE DIAGNOSES:  Severe end-stage arthritis and degenerative joint disease, left hip.  POSTOPERATIVE DIAGNOSES:  Severe end-stage arthritis and degenerative joint disease, left hip.  PROCEDURE:  Left total hip arthroplasty through direct anterior approach.  IMPLANTS:  DePuy Sector Gription acetabular component size 50, size 32+ 4 neutral polyethylene liner, size 11 Corail femoral component with standard offset, size 32+ 1 ceramic hip ball.  SURGEON:  Lind Guest. Ninfa Linden, M.D.  ASSISTANT:  Erskine Emery, P.A.  ANESTHESIA:  Spinal.  ANTIBIOTICS:  2 g of IV Ancef.  ESTIMATED BLOOD LOSS:  600 mL.  COMPLICATIONS:  None.  INDICATIONS:  Rebecca Lowery is a 73 year old female with bilateral hip severe degenerative arthritis.  She underwent a successful right total hip arthroplasty last year, and now due to debilitating pain in her left hip, wishes to have left hip replaced.  She did well with the right hip. Left hip has x-rays that shows complete loss of joint space, periarticular osteophytes, subchondral sclerosis, and severe joint space narrowing.  She understands the risks of acute blood loss anemia, nerve and vessel injury, fracture, infection, dislocation, and DVT.  She also understands the goals are improved mobility, decreased pain, and overall improved quality of life.  PROCEDURE DESCRIPTION:  After informed consent was obtained, appropriate left hip was marked.  She was brought to the operating room.  While she was on her stretcher, spinal anesthesia was obtained.  She was then laid in a supine position on the stretcher, Foley catheter was  placed, and both feet had traction boots applied to them.  Next, she was placed supine on the HANA fracture table with the perineal post in place and both legs in inline skeletal traction, but no traction applied.  Her left operative hip was prepped and draped with DuraPrep and sterile drapes.  A time-out was called, she was identified as correct patient, correct left hip.  We then made an incision inferior and posterior to the anterior superior iliac spine and carried this obliquely down the leg.  I dissected down to the tensor fascia lata muscle and the tensor fascia was divided longitudinally.  We then proceeded with a direct anterior approach to the hip.  We cauterized the lateral femoral circumflex vessels and placed Cobra retractors around the medial neck and lateral neck.  We opened up the hip capsule in an LEFT-type format and placed the Cobra retractors within the hip capsule.  We then made my femoral neck cut proximal to the lesser trochanter using oscillating saw and completed this with an osteotome.  We placed a corkscrew guide in the femoral head and removed the femoral head in its entirety and found it to be devoid of cartilage.  We then cleaned the acetabulum of debris and placed a bent Hohmann medially and a Cobra retractor laterally, we again began reaming from a size 42 reamer  in 2 mm increments up to a size 50 with all reamers placed under direct visualization and last reamer under direct fluoroscopy so we could obtain the depth of ,reamer inclination, and anteversion.  Once this was accomplished, we placed the real DePuy Sector Gription acetabular component size 50, the apex hole eliminator guide, and a 32+ 4 neutral polyethylene liner.  Attention was then turned to the femur.  With the leg externally rotated to 100 degrees, extended and adducted, we were able to place a Mueller retractor medially and a Hohmann retractor behind the greater trochanter.  We released the  lateral joint capsule and then used a box cutting osteotome to enter the femoral canal and a rongeur to lateralize.  I then began broaching from a size 8 broach up to a size 11, which actually corresponded with her other side.  We then trialed a standard neck and a 32+ 1 hip ball.  We brought the leg back up and over and with traction and internal rotation reduced in the pelvis.  Her leg lengths were measured equal, her offsets were equal.  She had good range of motion, and even past 90 degrees of external rotation was stable.  We then dislocated the hip and removed the trial components and placed the real Corail femoral component DePuy size 11 with standard offset and the real 32+ 1 ceramic hip ball.  We reduced this back in the acetabulum and was stable.  We copiously irrigated the soft tissue with normal saline solution and then closed the joint capsule with interrupted #1 Ethibond suture followed by running #1 Vicryl in the tensor fascia, 0-Vicryl in the deep tissue, 2-0 Vicryl in the subcutaneous tissue, 4-0 Monocryl subcuticular stitch.  Steri-Strips and Aquacel dressing.  She was then taken off the HANA table into the recovery room in stable condition. All final counts were correct.  There were no complications noted.  Of note, Erskine Emery, PA-C assisted in the entire case and his assistance was crucial for helping facilitate the case and closing the wound.     Lind Guest. Ninfa Linden, M.D.     CYB/MEDQ  D:  01/31/2013  T:  02/01/2013  Job:  [id number]

## 2013-02-01 NOTE — Progress Notes (Signed)
Physical Therapy Treatment Patient Details Name: Rebecca Lowery MRN: [id number] DOB: 08/02/1940 Today's Date: 02/01/2013 Time: 1128-1200 PT Time Calculation (min): 32 min  PT Assessment / Plan / Recommendation  History of Present Illness     PT Comments     Follow Up Recommendations  SNF     Does the patient have the potential to tolerate intense rehabilitation     Barriers to Discharge        Equipment Recommendations  None recommended by PT    Recommendations for Other Services OT consult  Frequency 7X/week   Progress towards PT Goals Progress towards PT goals: Progressing toward goals  Plan Current plan remains appropriate    Precautions / Restrictions Precautions Precautions: Fall Precaution Comments: Pt reporting significant back pain post op Restrictions Weight Bearing Restrictions: No Other Position/Activity Restrictions: WBAT   Pertinent Vitals/Pain 7/10; premed, ice pack provided    Mobility  Bed Mobility Overal bed mobility: +2 for physical assistance;Needs Assistance Bed Mobility: Sit to Supine Sit to supine: +2 for physical assistance;Mod assist General bed mobility comments: cues for sequence and use of R LE to self assist Transfers Overall transfer level: Needs assistance Equipment used: Rolling walker (2 wheeled) Transfers: Sit to/from Stand Sit to Stand: Mod assist General transfer comment: cues for LE management and use of UEs to self assist Ambulation/Gait Ambulation/Gait assistance: +2 safety/equipment;Mod assist Ambulation Distance (Feet): 40 Feet (and 5) Assistive device: Rolling walker (2 wheeled) Gait Pattern/deviations: Step-to pattern;Decreased step length - right;Decreased step length - left;Shuffle;Antalgic;Trunk flexed Gait velocity: decr General Gait Details: Cues for sequence, posture, position from RW: Increased time for task completion    Exercises     PT Diagnosis:    PT Problem List:   PT Treatment Interventions:      PT Goals (current goals can now be found in the care plan section) Acute Rehab PT Goals Patient Stated Goal: Resume previous lifestyle with decreased pain PT Goal Formulation: With patient Time For Goal Achievement: 02/07/13 Potential to Achieve Goals: Good  Visit Information  Last PT Received On: 02/01/13 Assistance Needed: +2    Subjective Data  Subjective: No new complaints Patient Stated Goal: Resume previous lifestyle with decreased pain   Cognition  Cognition Arousal/Alertness: Awake/alert Behavior During Therapy: WFL for tasks assessed/performed Overall Cognitive Status: Within Functional Limits for tasks assessed    Balance     End of Session PT - End of Session Equipment Utilized During Treatment: Gait belt Activity Tolerance: Patient limited by pain;Other (comment) (and c/o dizziness with increased ambulation) Patient left: in bed;with call bell/phone within reach Nurse Communication: Mobility status   GP     Glendy Barsanti 02/01/2013, 1:02 PM

## 2013-02-01 NOTE — Progress Notes (Signed)
Clinical Social Work Department CLINICAL SOCIAL WORK PLACEMENT NOTE 02/01/2013  Patient:  Rebecca Lowery, Rebecca Lowery  Account Number:  [id number] Admit date:  01/31/2013  Clinical Social Worker:  Levie Heritage  Date/time:  02/01/2013 12:00 N  Clinical Social Work is seeking post-discharge placement for this patient at the following level of care:   SKILLED NURSING   (*CSW will update this form in Epic as items are completed)   02/01/2013  Patient/family provided with [place] Department of Clinical Social Work's list of facilities offering this level of care within the geographic area requested by the patient (or if unable, by the patient's family).  02/01/2013  Patient/family informed of their freedom to choose among providers that offer the needed level of care, that participate in Medicare, Medicaid or managed care program needed by the patient, have an available bed and are willing to accept the patient.  02/01/2013  Patient/family informed of [facility]' ownership interest in [facility], as well as of the fact that they are under no obligation to receive care at this facility.  PASARR submitted to EDS on existing PASARR number received from EDS on   FL2 transmitted to all facilities in geographic area requested by pt/family on  02/01/2013 FL2 transmitted to all facilities within larger geographic area on   Patient informed that his/her managed care company has contracts with or will negotiate with  certain facilities, including the following:     Patient/family informed of bed offers received:   Patient chooses bed at  Physician recommends and patient chooses bed at    Patient to be transferred to  on   Patient to be transferred to facility by   The following physician request were entered in Epic:   Additional Comments:  Bernita Raisin, [place] Work [phone]

## 2013-02-01 NOTE — Progress Notes (Signed)
Subjective: 1 Day Post-Op Procedure(s) (LRB): LEFT TOTAL HIP ARTHROPLASTY ANTERIOR APPROACH (Left) Patient reports pain as moderate.  Asymptomatic acute blood loss anemia.  Objective: Vital signs in last 24 hours: Temp:  [97.4 F (36.3 C)-99.7 F (37.6 C)] 99.5 F (37.5 C) (01/10 1003) Pulse Rate:  [74-97] 90 (01/10 1003) Resp:  [9-17] 16 (01/10 1003) BP: (107-134)/(49-74) 127/59 mmHg (01/10 1003) SpO2:  [95 %-100 %] 95 % (01/10 1003) Weight:  [77.111 kg (170 lb)] 77.111 kg (170 lb) (01/09 1800)  Intake/Output from previous day: 01/09 0701 - 01/10 0700 In: 4382.5 [P.O.:200; I.V.:4127.5; IV Piggyback:55] Out: 1275 [Urine:1250; Blood:600] Intake/Output this shift:     Recent Labs  02/01/13 0526  HGB 9.5*    Recent Labs  02/01/13 0526  WBC 12.2*  RBC 3.13*  HCT 29.0*  PLT 166    Recent Labs  02/01/13 0526  NA 136*  K 4.1  CL 101  CO2 27  BUN 17  CREATININE 0.53  GLUCOSE 140*  CALCIUM 8.1*   No results found for this basename: LABPT, INR,  in the last 72 hours  Sensation intact distally Intact pulses distally Dorsiflexion/Plantar flexion intact Incision: scant drainage  Assessment/Plan: 1 Day Post-Op Procedure(s) (LRB): LEFT TOTAL HIP ARTHROPLASTY ANTERIOR APPROACH (Left) Up with therapy Discharge to SNF likely Monday or Tues  Goldy Calandra Y 02/01/2013, 10:06 AM

## 2013-02-01 NOTE — Progress Notes (Signed)
Clinical Social Work Department BRIEF PSYCHOSOCIAL ASSESSMENT 02/01/2013  Patient:  Rebecca Lowery, Rebecca Lowery     Account Number:  [id number]     Admit date:  01/31/2013  Clinical Social Worker:  Levie Heritage  Date/Time:  02/01/2013 11:57 AM  Referred by:  Physician  Date Referred:  02/01/2013 Referred for  SNF Placement   Other Referral:   Interview type:  Patient Other interview type:    PSYCHOSOCIAL DATA Living Status:  ALONE Admitted from facility:   Level of care:   Primary support name:  Rebecca Lowery Primary support relationship to patient:  CHILD, ADULT Degree of support available:   strong    CURRENT CONCERNS Current Concerns  Post-Acute Placement   Other Concerns:    SOCIAL WORK ASSESSMENT / PLAN Met with Pt to discuss d/c plans.    Pt is in agreement that SNF is required for her, as she lives alone and wouldn't be able to care for herself, at this time.    Pt stated that she has been to [facility] and that she didn't like that facility; she'd like to go to [place].    Provided Pt with a SNF list.    Pt gave CSW to begin a [place] SNF search on her behalf.    CSW thanked Pt for her time.   Assessment/plan status:  Psychosocial Support/Ongoing Assessment of Needs Other assessment/ plan:   Information/referral to community resources:   SNF list    PATIENT'S/FAMILY'S RESPONSE TO PLAN OF CARE: Pt stated that she lives alone and that it's not safe for her to d/c home, at this time.  She is fine with going to a SNF, although she doesn't want to go back to [place].    Pt was calm, cooperative and pleasant.    Pt thanked CSW for time and assistance.   Bernita Raisin, [place] Work [phone]

## 2013-02-02 LAB — CBC
HCT: 24.8 % — ABNORMAL LOW (ref 36.0–46.0)
HEMOGLOBIN: 8.2 g/dL — AB (ref 12.0–15.0)
MCH: 30.5 pg (ref 26.0–34.0)
MCHC: 33.1 g/dL (ref 30.0–36.0)
MCV: 92.2 fL (ref 78.0–100.0)
PLATELETS: 155 10*3/uL (ref 150–400)
RBC: 2.69 MIL/uL — AB (ref 3.87–5.11)
RDW: 14.4 % (ref 11.5–15.5)
WBC: 12.6 10*3/uL — AB (ref 4.0–10.5)

## 2013-02-02 NOTE — Progress Notes (Signed)
Patient ID: Rebecca Lowery, female   DOB: Jul 12, 1940, 73 y.o.   MRN: [id number] Postoperative day 2 total hip arthroplasty. Hemoglobin 8.2. Physical therapy progressive ambulation. Dressing clean and dry.

## 2013-02-02 NOTE — Progress Notes (Signed)
OT Cancellation Note  Patient Details Name: Rebecca Lowery MRN: [id number] DOB: 01-07-1941   Cancelled Treatment:    Reason Eval/Treat Not Completed: Fatigue/lethargy limiting ability to participate. Pt had just gotten back to bed. OT will recheck on pt later today or next day for OT eval as schedule allows.  Discussed role of OT with pt.  Betsy Pries 02/02/2013, 10:29 AM

## 2013-02-02 NOTE — Progress Notes (Signed)
Physical Therapy Treatment Patient Details Name: Rebecca Lowery MRN: [id number] DOB: 11/15/1940 Today's Date: 02/02/2013 Time: 5625-6389 PT Time Calculation (min): 25 min  PT Assessment / Plan / Recommendation  History of Present Illness s/p Left DA THA   PT Comments   Pt progressing well, will benefit from STSNF; HgB decr to 8.2 but asymptomatic during PT session  Follow Up Recommendations  SNF     Does the patient have the potential to tolerate intense rehabilitation     Barriers to Discharge        Equipment Recommendations  None recommended by PT    Recommendations for Other Services OT consult  Frequency 7X/week   Progress towards PT Goals Progress towards PT goals: Progressing toward goals  Plan Current plan remains appropriate    Precautions / Restrictions Precautions Precautions: Fall Precaution Comments: some back pain Restrictions Other Position/Activity Restrictions: WBAT   Pertinent Vitals/Pain No c/o pain,mild soreness L hip    Mobility  Bed Mobility Overal bed mobility: Needs Assistance Bed Mobility: Sit to Supine Sit to supine: Min assist General bed mobility comments: cues for technique Transfers Overall transfer level: Needs assistance Equipment used: Rolling walker (2 wheeled) Transfers: Sit to/from Stand Sit to Stand: Min assist General transfer comment: cues for hand placement and LLE position Ambulation/Gait Ambulation/Gait assistance: Min guard Ambulation Distance (Feet): 98 Feet Assistive device: Rolling walker (2 wheeled) Gait Pattern/deviations: Step-to pattern General Gait Details: Cues for sequence, posture, position from RW: Increased time for task completion    Exercises Total Joint Exercises Ankle Circles/Pumps: AROM;Both;10 reps;Supine Quad Sets: AROM;Both;10 reps;Supine Heel Slides: AROM;Left;15 reps;Supine Hip ABduction/ADduction: AAROM;Left;10 reps;Supine   PT Diagnosis:    PT Problem List:   PT Treatment  Interventions:     PT Goals (current goals can now be found in the care plan section) Acute Rehab PT Goals Patient Stated Goal: Resume previous lifestyle with decreased pain Time For Goal Achievement: 02/07/13 Potential to Achieve Goals: Good  Visit Information  Last PT Received On: 02/02/13 Assistance Needed: +1 History of Present Illness: s/p Left DA THA    Subjective Data  Subjective: No new complaints Patient Stated Goal: Resume previous lifestyle with decreased pain   Cognition  Cognition Arousal/Alertness: Awake/alert Behavior During Therapy: WFL for tasks assessed/performed Overall Cognitive Status: Within Functional Limits for tasks assessed    Balance     End of Session PT - End of Session Equipment Utilized During Treatment: Gait belt Activity Tolerance: Patient tolerated treatment well Patient left: in bed;with call bell/phone within reach Nurse Communication: Mobility status   GP     [facility] 02/02/2013, 10:00 AM

## 2013-02-03 ENCOUNTER — Encounter (HOSPITAL_COMMUNITY): Payer: Self-pay | Admitting: Orthopaedic Surgery

## 2013-02-03 LAB — CBC
HCT: 29.8 % — ABNORMAL LOW (ref 36.0–46.0)
HEMATOCRIT: 23.3 % — AB (ref 36.0–46.0)
HEMOGLOBIN: 7.9 g/dL — AB (ref 12.0–15.0)
Hemoglobin: 10.3 g/dL — ABNORMAL LOW (ref 12.0–15.0)
MCH: 31 pg (ref 26.0–34.0)
MCH: 30.8 pg (ref 26.0–34.0)
MCHC: 33.9 g/dL (ref 30.0–36.0)
MCHC: 34.6 g/dL (ref 30.0–36.0)
MCV: 91.4 fL (ref 78.0–100.0)
MCV: 89.2 fL (ref 78.0–100.0)
PLATELETS: 192 10*3/uL (ref 150–400)
Platelets: 144 10*3/uL — ABNORMAL LOW (ref 150–400)
RBC: 2.55 MIL/uL — ABNORMAL LOW (ref 3.87–5.11)
RBC: 3.34 MIL/uL — AB (ref 3.87–5.11)
RDW: 14.2 % (ref 11.5–15.5)
RDW: 14.2 % (ref 11.5–15.5)
WBC: 12.1 10*3/uL — AB (ref 4.0–10.5)
WBC: 11.6 10*3/uL — AB (ref 4.0–10.5)

## 2013-02-03 LAB — PREPARE RBC (CROSSMATCH)

## 2013-02-03 MED ORDER — FUROSEMIDE 10 MG/ML IJ SOLN
20.0000 mg | Freq: Once | INTRAMUSCULAR | Status: AC
  Administered 2013-02-03: 20 mg via INTRAVENOUS
  Filled 2013-02-03: qty 2

## 2013-02-03 NOTE — Progress Notes (Signed)
OT Cancellation Note  Patient Details Name: Rebecca Lowery MRN: [id number] DOB: [birth year]   Cancelled Treatment:    Reason Eval/Treat Not Completed: Medical issues which prohibited therapy. Pt starting 2nd unit of blood.  Noted plan for SNF.  Will check on pt first thing in the morning.  Betsy Pries 02/03/2013, 1:32 PM

## 2013-02-03 NOTE — Care Management Note (Signed)
    Page 1 of 1   02/03/2013     1:10:35 PM   CARE MANAGEMENT NOTE 02/03/2013  Patient:  Rebecca Lowery, Rebecca Lowery   Account Number:  [id number]  Date Initiated:  02/03/2013  Documentation initiated by:  Sherrin Daisy  Subjective/Objective Assessment:   dx left hip osteoarthritis; total hip replacement     Action/Plan:   SNF rehab   Anticipated DC Date:  02/04/2013   Anticipated DC Plan:  SKILLED NURSING FACILITY  In-house referral  Clinical Social Worker      DC Planning Services  CM consult      Choice offered to / List presented to:             Status of service:  Completed, signed off Medicare Important Message given?   (If response is "NO", the following Medicare IM given date fields will be blank) Date Medicare IM given:   Date Additional Medicare IM given:    Discharge Disposition:    Per UR Regulation:  Reviewed for med. necessity/level of care/duration of stay  If discussed at [place] of Stay Meetings, dates discussed:    Comments:

## 2013-02-03 NOTE — Progress Notes (Signed)
Physical Therapy Treatment Patient Details Name: Rebecca Lowery MRN: [id number] DOB: January 28, 1940 Today's Date: 02/03/2013 Time: 9622-2979 PT Time Calculation (min): 39 min  PT Assessment / Plan / Recommendation  History of Present Illness s/p Left DA THA   PT Comments   Pt progressing well; pain controlled at 3/10 left hip; one LOB during amb with min to recover; pt beginning to demo carryover with safe mobility techniques; will benefit from STSNF; Pt prefers [facility]  Follow Up Recommendations  SNF     Does the patient have the potential to tolerate intense rehabilitation     Barriers to Discharge        Equipment Recommendations  None recommended by PT    Recommendations for Other Services    Frequency 7X/week   Progress towards PT Goals Progress towards PT goals: Progressing toward goals  Plan Current plan remains appropriate    Precautions / Restrictions Precautions Precautions: Fall Precaution Comments: noc/o back pain today Restrictions Other Position/Activity Restrictions: WBAT   Pertinent Vitals/Pain     Mobility  Bed Mobility Overal bed mobility: Needs Assistance Bed Mobility: Sit to Supine;Supine to Sit Supine to sit: Supervision Sit to supine: Min assist General bed mobility comments: verbal cues for technique Transfers Overall transfer level: Needs assistance Equipment used: Rolling walker (2 wheeled) Transfers: Sit to/from [facility] Sit to Stand: Min guard Stand pivot transfers: Min guard General transfer comment: cues for hand placement, pt able to self correct Ambulation/Gait Ambulation/Gait assistance: Min guard;Supervision Ambulation Distance (Feet): 98 Feet Assistive device: Rolling walker (2 wheeled) Gait Pattern/deviations: Step-to pattern Gait velocity: decr General Gait Details: Cues for sequence, posture, position from RW: Increased time for task completion    Exercises Total Joint Exercises Ankle Circles/Pumps:  AROM;Both;10 reps;Supine Quad Sets: AROM;Both;10 reps;Supine Short Arc Quad: AROM;Left;10 reps Heel Slides: AROM;Left;15 reps;Supine Hip ABduction/ADduction: AAROM;Left;10 reps;Supine   PT Diagnosis:    PT Problem List:   PT Treatment Interventions:     PT Goals (current goals can now be found in the care plan section) Acute Rehab PT Goals Patient Stated Goal: Resume previous lifestyle with decreased pain Time For Goal Achievement: 02/07/13 Potential to Achieve Goals: Good  Visit Information  Last PT Received On: 02/03/13 Assistance Needed: +1 History of Present Illness: s/p Left DA THA    Subjective Data  Subjective: getting blood Patient Stated Goal: Resume previous lifestyle with decreased pain   Cognition  Cognition Arousal/Alertness: Awake/alert Behavior During Therapy: WFL for tasks assessed/performed Overall Cognitive Status: Within Functional Limits for tasks assessed    Balance     End of Session PT - End of Session Equipment Utilized During Treatment: Gait belt Activity Tolerance: Patient tolerated treatment well Patient left: in bed;with call bell/phone within reach Nurse Communication: Mobility status   GP     [facility] 02/03/2013, 11:57 AM

## 2013-02-03 NOTE — Progress Notes (Signed)
Subjective: 3 Days Post-Op Procedure(s) (LRB): LEFT TOTAL HIP ARTHROPLASTY ANTERIOR APPROACH (Left) Patient reports pain as mild.  Hgb did not drop far from yesterday, but slightly more symptomatic from acute blood loss anemia.  Objective: Vital signs in last 24 hours: Temp:  [98.4 F (36.9 C)-99 F (37.2 C)] 99 F (37.2 C) (01/12 0545) Pulse Rate:  [101-102] 102 (01/12 0545) Resp:  [14-20] 18 (01/12 0545) BP: (108-137)/(62-66) 113/66 mmHg (01/12 0545) SpO2:  [93 %-98 %] 98 % (01/12 0545)  Intake/Output from previous day: 01/11 0701 - 01/12 0700 In: 2646.3 [P.O.:720; I.V.:1926.3] Out: 2350 [Urine:2350] Intake/Output this shift:     Recent Labs  02/01/13 0526 02/02/13 0516 02/03/13 0549  HGB 9.5* 8.2* 7.9*    Recent Labs  02/02/13 0516 02/03/13 0549  WBC 12.6* 12.1*  RBC 2.69* 2.55*  HCT 24.8* 23.3*  PLT 155 144*    Recent Labs  02/01/13 0526  NA 136*  K 4.1  CL 101  CO2 27  BUN 17  CREATININE 0.53  GLUCOSE 140*  CALCIUM 8.1*   No results found for this basename: LABPT, INR,  in the last 72 hours  Sensation intact distally Intact pulses distally Dorsiflexion/Plantar flexion intact Incision: dressing C/D/I  Assessment/Plan: 3 Days Post-Op Procedure(s) (LRB): LEFT TOTAL HIP ARTHROPLASTY ANTERIOR APPROACH (Left) Up with therapy Plan for discharge tomorrow to SNF Transfusion today.  Gauge Winski Y 02/03/2013, 7:27 AM

## 2013-02-04 LAB — TYPE AND SCREEN
ABO/RH(D): O POS
Antibody Screen: NEGATIVE
Unit division: 0
Unit division: 0

## 2013-02-04 LAB — TROPONIN I: Troponin I: <0.3 ng/mL (ref <0.30)

## 2013-02-04 MED ORDER — BISACODYL 10 MG RE SUPP: 10.0000 mg | Freq: Once | RECTAL | Status: DC

## 2013-02-04 MED ORDER — METHOCARBAMOL 500 MG PO TABS: 500.0000 mg | ORAL_TABLET | Freq: Three times a day (TID) | ORAL | Status: DC

## 2013-02-04 MED ORDER — ASPIRIN 325 MG PO TBEC: 325.0000 mg | DELAYED_RELEASE_TABLET | Freq: Two times a day (BID) | ORAL | Status: DC

## 2013-02-04 MED ORDER — OXYCODONE-ACETAMINOPHEN 5-325 MG PO TABS: 1.0000 | ORAL_TABLET | ORAL | Status: DC | PRN

## 2013-02-04 NOTE — Progress Notes (Signed)
Pt has SNF bed at [facility] today if stable for d/c. [employer] has provided authorization for placement.  Werner Lean LCSW [phone]

## 2013-02-04 NOTE — Progress Notes (Signed)
Subjective: 4 Days Post-Op Procedure(s) (LRB): LEFT TOTAL HIP ARTHROPLASTY ANTERIOR APPROACH (Left) Patient reports pain as mild.  Having some Chest pain feels this is due to the use of the walker. Denies SOB or diaphoresis . Reports has had chest pain in past and was due to nerves. Patient tearful this AM and states she cannot quit crying.   Objective: Vital signs in last 24 hours: Temp:  [97.3 F (36.3 C)-99.8 F (37.7 C)] 99.2 F (37.3 C) (01/13 0545) Pulse Rate:  [85-102] 95 (01/13 0545) Resp:  [15-16] 15 (01/13 0545) BP: (97-147)/(65-82) 143/80 mmHg (01/13 0545) SpO2:  [92 %-100 %] 96 % (01/13 0545)  Intake/Output from previous day: 01/12 0701 - 01/13 0700 In: 1472.9 [P.O.:720; I.V.:80; Blood:672.9] Out: -  Intake/Output this shift:     Recent Labs  02/02/13 0516 02/03/13 0549 02/03/13 1850  HGB 8.2* 7.9* 10.3*    Recent Labs  02/03/13 0549 02/03/13 1850  WBC 12.1* 11.6*  RBC 2.55* 3.34*  HCT 23.3* 29.8*  PLT 144* 192   No results found for this basename: NA, K, CL, CO2, BUN, CREATININE, GLUCOSE, CALCIUM,  in the last 72 hours No results found for this basename: LABPT, INR,  in the last 72 hours  Neurovascular intact Sensation intact distally Intact pulses distally Dorsiflexion/Plantar flexion intact Incision: scant drainage Calf supple non tender.  Tenderness left pectoralis region.  Assessment/Plan: 4 Days Post-Op Procedure(s) (LRB): LEFT TOTAL HIP ARTHROPLASTY ANTERIOR APPROACH (Left) Will check EKG this AM. Acute blood loss anemia HGB 10.3 this AM s/p blood transfusion. Erskine Emery 02/04/2013, 8:17 AM

## 2013-02-04 NOTE — Progress Notes (Signed)
OT Cancellation Note  Patient Details Name: Rebecca Lowery MRN: [id number] DOB: April 27, 1940   Cancelled Treatment:    Reason Eval/Treat Not Completed: Other (comment).  Noted pt has approval for SNF and may transfer today.  Will defer OT eval to that venue.  Berdella Bacot 02/04/2013, 12:24 PM Lesle Chris, OTR/L [phone] 02/04/2013

## 2013-02-04 NOTE — Progress Notes (Signed)
Patient ID: Rebecca Lowery, female   DOB: 1940/04/21, 73 y.o.   MRN: [id number]  Reviewed ECG change in V3 could reflect lead positiion . Troponin negative.  Chest pain has been present for greater than 24 hours and is felt by Dr. Ninfa Linden and myself to be skeletal muscle with a component of anxiety. Will d/c to SNF today.

## 2013-02-04 NOTE — Progress Notes (Signed)
Physical Therapy Treatment Patient Details Name: Rebecca Lowery MRN: [id number] DOB: 02/16/1940 Today's Date: 02/04/2013 Time: 1022-1040 PT Time Calculation (min): 18 min  PT Assessment / Plan / Recommendation  History of Present Illness s/p Left DA THA   PT Comments   Pt progressing  Follow Up Recommendations  SNF     Does the patient have the potential to tolerate intense rehabilitation     Barriers to Discharge        Equipment Recommendations  None recommended by PT    Recommendations for Other Services    Frequency 7X/week   Progress towards PT Goals Progress towards PT goals: Progressing toward goals  Plan Current plan remains appropriate    Precautions / Restrictions Precautions Precautions: Fall Precaution Comments: noc/o back pain today Restrictions Weight Bearing Restrictions: No Other Position/Activity Restrictions: WBAT   Pertinent Vitals/Pain Pain 3-4/10    Mobility  Bed Mobility Overal bed mobility: Needs Assistance Bed Mobility: Supine to Sit Supine to sit: Supervision General bed mobility comments: cues for safe technique Transfers Overall transfer level: Needs assistance Equipment used: Rolling walker (2 wheeled) Transfers: Sit to/from Stand Sit to Stand: Min guard General transfer comment: subtle cuesf or hand placement Ambulation/Gait Ambulation/Gait assistance: Min guard;Supervision Ambulation Distance (Feet): 120 Feet Assistive device: Rolling walker (2 wheeled) Gait Pattern/deviations: Step-to pattern Gait velocity: decr General Gait Details: Cues for sequence, posture, position from RW: Increased time for task completion    Exercises Total Joint Exercises Ankle Circles/Pumps: AROM;Both;10 reps;Supine Quad Sets: AROM;Both;10 reps;Supine   PT Diagnosis:    PT Problem List:   PT Treatment Interventions:     PT Goals (current goals can now be found in the care plan section) Acute Rehab PT Goals Patient Stated Goal: Resume  previous lifestyle with decreased pain PT Goal Formulation: With patient Time For Goal Achievement: 02/07/13 Potential to Achieve Goals: Good  Visit Information  Last PT Received On: 02/04/13 History of Present Illness: s/p Left DA THA    Subjective Data  Patient Stated Goal: Resume previous lifestyle with decreased pain   Cognition  Cognition Arousal/Alertness: Awake/alert Behavior During Therapy: WFL for tasks assessed/performed Overall Cognitive Status: Within Functional Limits for tasks assessed    Balance     End of Session PT - End of Session Equipment Utilized During Treatment: Gait belt Activity Tolerance: Patient tolerated treatment well Patient left: with call bell/phone within reach;in chair Nurse Communication: Mobility status   GP     [facility] 02/04/2013, 11:16 AM

## 2013-02-04 NOTE — Discharge Summary (Signed)
Patient ID: KRISHNA HEUER MRN: [id number] DOB/AGE: 1940-09-21 73 y.o.  Admit date: 01/31/2013 Discharge date: 02/04/2013  Admission Diagnoses:  Principal Problem:   Arthritis pain of left hip Active Problems:   Status post THR (total hip replacement)   Discharge Diagnoses:  Status post left total hip arthroplasty Acute blood loss anemia secondary to surgery Chest pain ? Skeletal muscle vs anxiety.  Past Medical History  Diagnosis Date  . Insomnia   . Anxiety   . Depression   . Chest pain     HX OF CHEST PAIN --NUCLEAR STRESS TEST 04/19/11 NORMAL--PT STATES SHE WAS TOLD STRESS MAY BE THE CAUSE OF HER CHEST PAINS  . Hypertension   . Sleep apnea     HAS CPAP MACHINE-BUT NO LONGER USES  . Heart murmur     MVP - PT THINKS HER CHEST PAIN IS RELATED TO HER MVP  . GERD (gastroesophageal reflux disease)   . Cancer 1991    LEFT MASTECTOMY FOR BREAST CANCER--PT NOT SURE IF SHE IS TO AVOID NEEDLES LEFT ARM-DID HAVE AXILLARY LYMPH NODES REMOVED  . Arthritis     OA BOTH HIPS, KNEES AND BACK.  SEVER PAIN IN RIGHT HIP  . Family history of anesthesia complication     PT'S SON HAD ANESTHESIA PROBLEM WITH EXCISION OF WISDOM TEETH ---THRASING AROUND--BUT DID GO HOME SAME DAY OF PROCEDURE  . Anginal pain     09/2012, checked out and was told it was stress    Surgeries: Procedure(s): LEFT TOTAL HIP ARTHROPLASTY ANTERIOR APPROACH on 01/31/2013   Consultants:    Discharged Condition: Improved  Hospital Course: KAMEISHA MALICKI is an 73 y.o. female who was admitted 01/31/2013 for operative treatment ofArthritis pain of hip. Patient has severe unremitting pain that affects sleep, daily activities, and work/hobbies. After pre-op clearance the patient was taken to the operating room on 01/31/2013 and underwent  Procedure(s): LEFT TOTAL HIP ARTHROPLASTY ANTERIOR APPROACH.    Patient was given perioperative antibiotics: Anti-infectives   Start     Dose/Rate Route Frequency Ordered Stop   01/31/13  1600  ceFAZolin (ANCEF) IVPB 1 g/50 mL premix     1 g 100 mL/hr over 30 Minutes Intravenous Every 6 hours 01/31/13 1341 01/31/13 2252   01/31/13 0720  ceFAZolin (ANCEF) IVPB 2 g/50 mL premix     2 g 100 mL/hr over 30 Minutes Intravenous On call to O.R. 01/31/13 0720 01/31/13 1030       Patient was given sequential compression devices, early ambulation, and chemoprophylaxis to prevent DVT. Post-op anemia secondary to surgery required transfusion of 2 units of packed red blood . Chest pain with negative Troponin I negative, most likely skeletal muscle with anxiety component.  Patient benefited maximally from hospital stay and there were no complications.    Recent vital signs: Patient Vitals for the past 24 hrs:  BP Temp Temp src Pulse Resp SpO2  02/04/13 0950 154/70 mmHg 98.5 F (36.9 C) Oral 94 16 95 %  02/04/13 0545 143/80 mmHg 99.2 F (37.3 C) Oral 95 15 96 %  02/04/13 0000 - - - - 16 99 %  02/03/13 2314 - - - 100 - 100 %  02/03/13 2221 147/82 mmHg 99.8 F (37.7 C) Oral 98 16 99 %  02/03/13 2000 - - - - 16 97 %  02/03/13 1930 - - - - - 97 %  02/03/13 1649 97/73 mmHg 98.7 F (37.1 C) Oral 91 16 96 %  02/03/13 1610 131/67 mmHg 98.9  F (37.2 C) Oral 102 16 95 %  02/03/13 1510 116/67 mmHg 97.3 F (36.3 C) Oral 96 16 96 %  02/03/13 1410 126/68 mmHg 98 F (36.7 C) Oral 89 16 95 %  02/03/13 1330 114/65 mmHg 98.4 F (36.9 C) Oral 85 16 100 %  02/03/13 1220 108/67 mmHg 98.4 F (36.9 C) Oral 89 16 92 %  02/03/13 1145 115/68 mmHg 98.1 F (36.7 C) Oral 91 16 98 %     Recent laboratory studies:  Recent Labs  02/03/13 0549 02/03/13 1850  WBC 12.1* 11.6*  HGB 7.9* 10.3*  HCT 23.3* 29.8*  PLT 144* 192     Discharge Medications:     Medication List    STOP taking these medications       HYDROcodone-acetaminophen 5-325 MG per tablet  Commonly known as:  NORCO/VICODIN     ibuprofen 200 MG tablet  Commonly known as:  ADVIL,MOTRIN     meloxicam 15 MG tablet  Commonly  known as:  MOBIC      TAKE these medications       ARIPiprazole 2 MG tablet  Commonly known as:  ABILIFY  Take 2 mg by mouth every morning.     aspirin 325 MG EC tablet  Take 1 tablet (325 mg total) by mouth 2 (two) times daily after a meal.     beta carotene w/minerals tablet  Take 1 tablet by mouth daily.     buPROPion 150 MG 24 hr tablet  Commonly known as:  WELLBUTRIN XL  Take 450 mg by mouth every morning.     CALCIUM 600 + D PO  Take 1 tablet by mouth daily.     clonazePAM 1 MG tablet  Commonly known as:  KLONOPIN  Take 1 mg by mouth at bedtime.     doxepin 25 MG capsule  Commonly known as:  SINEQUAN  Take 25 mg by mouth at bedtime as needed (sleep).     loratadine 10 MG tablet  Commonly known as:  CLARITIN  Take 10 mg by mouth at bedtime.     LORazepam 0.5 MG tablet  Commonly known as:  ATIVAN  Take 1.5 mg by mouth at bedtime. Takes 3 tablets at night     losartan 100 MG tablet  Commonly known as:  COZAAR  Take 100 mg by mouth daily.     methocarbamol 500 MG tablet  Commonly known as:  ROBAXIN  Take 1 tablet (500 mg total) by mouth 3 (three) times daily.     omeprazole 20 MG tablet  Commonly known as:  PRILOSEC OTC  Take 20 mg by mouth at bedtime.     oxyCODONE-acetaminophen 5-325 MG per tablet  Commonly known as:  ROXICET  Take 1-2 tablets by mouth every 4 (four) hours as needed for severe pain.     zolpidem 10 MG tablet  Commonly known as:  AMBIEN  Take 20 mg by mouth at bedtime.        Diagnostic Studies: Dg Chest 2 View  01/27/2013   CLINICAL DATA:  Preop left total hip arthroplasty  EXAM: CHEST  2 VIEW  COMPARISON:  None  FINDINGS: The heart size and mediastinal contours are within normal limits. Both lungs are clear. The visualized skeletal structures are unremarkable. Surgical clips noted in the left axilla.  IMPRESSION: No active cardiopulmonary disease.   Electronically Signed   By: Kerby Moors M.D.   On: 01/27/2013 15:05   Dg Hip  Complete Left  01/31/2013   CLINICAL DATA:  Hip arthroplasty  EXAM: LEFT HIP - COMPLETE 2+ VIEW  COMPARISON:  None.  FINDINGS: There is interval placement of a left hip arthroplasty. There is no failure or complication. There is no dislocation.  There is a right hip arthroplasty.  IMPRESSION: Left total hip arthroplasty.   Electronically Signed   By: Kathreen Devoid   On: 01/31/2013 12:41   Dg Pelvis Portable  01/31/2013   CLINICAL DATA:  Postop left hip.  EXAM: PORTABLE PELVIS 1-2 VIEWS  COMPARISON:  03/29/2012  FINDINGS: Exam demonstrates no change in a right total hip arthroplasty. There has been placement of a left total hip arthroplasty with prosthesis intact and normally located. Remainder of the exam is unchanged.  IMPRESSION: Left total hip arthroplasty intact and normally located.  Stable right total hip arthroplasty.   Electronically Signed   By: Marin Olp M.D.   On: 01/31/2013 12:49   Dg Hip Portable 1 View Left  01/31/2013   CLINICAL DATA:  Postoperative study of the left hip status post left hip joint replacement  EXAM: PORTABLE LEFT HIP - 1 VIEW  COMPARISON:  Spot films from the C-arm of today's date  FINDINGS: The patient has undergone left hip joint prosthesis placement. The appearance of the prosthesis is radiographically good.  IMPRESSION: The patient has undergone left hip joint prosthesis placement. A pre-existing right hip joint prosthesis is present. Further interpretation is for Dr. Ninfa Linden.   Electronically Signed   By: David  Martinique   On: 01/31/2013 12:50   Dg C-arm 1-60 Min-no Report  01/31/2013   CLINICAL DATA:  Hip arthroplasty  EXAM: LEFT HIP - COMPLETE 2+ VIEW  COMPARISON:  None.  FINDINGS: There is interval placement of a left hip arthroplasty. There is no failure or complication. There is no dislocation. There is a right hip arthroplasty.  IMPRESSION: Left total hip arthroplasty.   Electronically Signed   By: Kathreen Devoid   On: 01/31/2013 15:10    Disposition: 03-Skilled  Nursing Facility      Discharge Orders   Future Orders Complete By Expires   Discharge wound care:  As directed    Comments:     Keep dressing clean and intact. May shower with dressing intact . Remove dressing Thursday and shower. After showering apply clean dressing.   PT eval and treat  As directed 02/04/2014   Comments:     Weight bearing as tolerated . No precautions status post anterior total hip.   Weight bearing as tolerated  As directed    Questions:     Laterality:     Extremity:           Signed: Kahli Fitzgerald 02/04/2013, 11:02 AM

## 2013-02-04 NOTE — Progress Notes (Signed)
Clinical Social Work Department CLINICAL SOCIAL WORK PLACEMENT NOTE 02/04/2013  Patient:  Rebecca Lowery, Rebecca Lowery  Account Number:  [id number] Admit date:  01/31/2013  Clinical Social Worker:  Levie Heritage  Date/time:  02/01/2013 12:00 N  Clinical Social Work is seeking post-discharge placement for this patient at the following level of care:   [place]   (*CSW will update this form in Epic as items are completed)   02/01/2013  Patient/family provided with [place] Department of Clinical Social Work's list of facilities offering this level of care within the geographic area requested by the patient (or if unable, by the patient's family).  02/01/2013  Patient/family informed of their freedom to choose among providers that offer the needed level of care, that participate in Medicare, Medicaid or managed care program needed by the patient, have an available bed and are willing to accept the patient.  02/01/2013  Patient/family informed of [facility]' ownership interest in [facility], as well as of the fact that they are under no obligation to receive care at this facility.  PASARR submitted to [employer] on  PASARR number received from [place] on   FL2 transmitted to all facilities in geographic area requested by pt/family on  02/01/2013 FL2 transmitted to all facilities within larger geographic area on   Patient informed that his/her managed care company has contracts with or will negotiate with  certain facilities, including the following:     Patient/family informed of bed offers received:  02/03/2013 Patient chooses bed at [place] Physician recommends and patient chooses bed at    Patient to be transferred to [place] on  02/04/2013 Patient to be transferred to facility by [facility]  The following physician request were entered in Epic:   Additional Comments: [employer] provided authorization prior to d/c to [employer].  Werner Lean LCSW  [phone]

## 2013-02-05 ENCOUNTER — Non-Acute Institutional Stay (SKILLED_NURSING_FACILITY): Payer: Medicare HMO | Admitting: Adult Health

## 2013-02-05 DIAGNOSIS — F329 Major depressive disorder, single episode, unspecified: Secondary | ICD-10-CM

## 2013-02-05 DIAGNOSIS — F411 Generalized anxiety disorder: Secondary | ICD-10-CM

## 2013-02-05 DIAGNOSIS — F3289 Other specified depressive episodes: Secondary | ICD-10-CM

## 2013-02-05 DIAGNOSIS — M1610 Unilateral primary osteoarthritis, unspecified hip: Secondary | ICD-10-CM

## 2013-02-05 DIAGNOSIS — F419 Anxiety disorder, unspecified: Secondary | ICD-10-CM

## 2013-02-05 DIAGNOSIS — F32A Depression, unspecified: Secondary | ICD-10-CM

## 2013-02-05 DIAGNOSIS — K219 Gastro-esophageal reflux disease without esophagitis: Secondary | ICD-10-CM

## 2013-02-05 DIAGNOSIS — I10 Essential (primary) hypertension: Secondary | ICD-10-CM

## 2013-02-07 ENCOUNTER — Non-Acute Institutional Stay (SKILLED_NURSING_FACILITY): Payer: Medicare HMO | Admitting: Internal Medicine

## 2013-02-07 DIAGNOSIS — I10 Essential (primary) hypertension: Secondary | ICD-10-CM

## 2013-02-07 DIAGNOSIS — E119 Type 2 diabetes mellitus without complications: Secondary | ICD-10-CM

## 2013-02-07 DIAGNOSIS — M1610 Unilateral primary osteoarthritis, unspecified hip: Secondary | ICD-10-CM

## 2013-02-07 DIAGNOSIS — D62 Acute posthemorrhagic anemia: Secondary | ICD-10-CM

## 2013-02-08 DIAGNOSIS — D62 Acute posthemorrhagic anemia: Secondary | ICD-10-CM | POA: Insufficient documentation

## 2013-02-08 NOTE — Progress Notes (Signed)
HISTORY & PHYSICAL  DATE: 02/07/2013   FACILITY: [facility]  LEVEL OF CARE: SNF (31)  ALLERGIES:  Allergies  Allergen Reactions  . Ciprofloxacin Hives    REACTION UNKNOWN  . Quinolones Hives    CHIEF COMPLAINT:  Manage left hip osteoarthritis, hypertension and diabetes mellitus  HISTORY OF PRESENT ILLNESS: Patient is a 73 year old Caucasian female.  HIP OSTEOARTHRITIS: patient had advanced end stage OA of the hip with progressively worsening pain & dysfunction.  Pt failed non-surgical conservative management.  Therefore pt underwent total hip arthroplasty & tolerated the procedure well.  Pt denies hip pain currently.  Pt was admitted to this facility for short term rehabilitation.  DM:pt's DM remains stable.  Pt denies polyuria, polydipsia, polyphagia, changes in vision or hypoglycemic episodes.  No complications noted from the medication presently being used.  Last hemoglobin A1c is: Not available.  HTN: Pt 's HTN remains stable.  Denies CP, sob, DOE,  headaches, dizziness or visual disturbances.  No complications from the medications currently being used.  Last BP : 135/67.  PAST MEDICAL HISTORY :  Past Medical History  Diagnosis Date  . Insomnia   . Anxiety   . Depression   . Chest pain     HX OF CHEST PAIN --NUCLEAR STRESS TEST 04/19/11 NORMAL--PT STATES SHE WAS TOLD STRESS MAY BE THE CAUSE OF HER CHEST PAINS  . Hypertension   . Sleep apnea     HAS CPAP MACHINE-BUT NO LONGER USES  . Heart murmur     MVP - PT THINKS HER CHEST PAIN IS RELATED TO HER MVP  . GERD (gastroesophageal reflux disease)   . Cancer 1991    LEFT MASTECTOMY FOR BREAST CANCER--PT NOT SURE IF SHE IS TO AVOID NEEDLES LEFT ARM-DID HAVE AXILLARY LYMPH NODES REMOVED  . Arthritis     OA BOTH HIPS, KNEES AND BACK.  SEVER PAIN IN RIGHT HIP  . Family history of anesthesia complication     PT'S SON HAD ANESTHESIA PROBLEM WITH EXCISION OF WISDOM TEETH ---THRASING AROUND--BUT DID  GO HOME SAME DAY OF PROCEDURE  . Anginal pain     09/2012, checked out and was told it was stress    PAST SURGICAL HISTORY: Past Surgical History  Procedure Laterality Date  . Tonsillectomy      AS A TEENAGER  . Cholecystectomy    . Breast surgery      LEFT MASTECTOMY AND AXILLARY DISSECTION FOR BREAST CANCER  . Abdominal hysterectomy    . Total hip arthroplasty Right 03/29/2012    Procedure: Right TOTAL HIP ARTHROPLASTY ANTERIOR APPROACH;  Surgeon: Mcarthur Rossetti, MD;  Location: [facility];  Service: Orthopedics;  Laterality: Right;  . Total hip arthroplasty Left 01/31/2013    Procedure: LEFT TOTAL HIP ARTHROPLASTY ANTERIOR APPROACH;  Surgeon: Mcarthur Rossetti, MD;  Location: [facility];  Service: Orthopedics;  Laterality: Left;    SOCIAL HISTORY:  reports that she has never smoked. She has never used smokeless tobacco. She reports that she drinks alcohol. She reports that she does not use illicit drugs.  FAMILY HISTORY: None  CURRENT MEDICATIONS: Reviewed per MAR  REVIEW OF SYSTEMS:  See HPI otherwise 14 point ROS is negative.  PHYSICAL EXAMINATION  VS:  T 97.9       P 88       RR 19      BP 135/67      POX% 98  GENERAL: no acute distress, moderately obese body habitus EYES: conjunctivae normal, sclerae normal, normal eye lids MOUTH/THROAT: lips without lesions,no lesions in the mouth,tongue is without lesions,uvula elevates in midline NECK: supple, trachea midline, no neck masses, no thyroid tenderness, no thyromegaly LYMPHATICS: no LAN in the neck, no supraclavicular LAN RESPIRATORY: breathing is even & unlabored, BS CTAB CARDIAC: RRR, no murmur,no extra heart sounds, left lower extremity +2 edema GI:  ABDOMEN: abdomen soft, normal BS, no masses, no tenderness  LIVER/SPLEEN: no hepatomegaly, no splenomegaly MUSCULOSKELETAL: HEAD: normal to inspection & palpation BACK: no kyphosis, scoliosis or spinal processes tenderness EXTREMITIES: LEFT UPPER EXTREMITY: full  range of motion, normal strength & tone RIGHT UPPER EXTREMITY:  full range of motion, normal strength & tone LEFT LOWER EXTREMITY:   range of motion not tested due to surgery, normal strength & tone RIGHT LOWER EXTREMITY:  Moderate range of motion, normal strength & tone PSYCHIATRIC: the patient is alert & oriented to person, affect & behavior appropriate  LABS/RADIOLOGY:  Labs reviewed: Basic Metabolic Panel:  Recent Labs  03/30/12 0536 01/27/13 1425 02/01/13 0526  NA 135 138 136*  K 4.2 4.4 4.1  CL 100 100 101  CO2 30 27 27   GLUCOSE 146* 104* 140*  BUN 16 25* 17  CREATININE 0.64 0.60 0.53  CALCIUM 8.8 10.4 8.1*   CBC:  Recent Labs  02/02/13 0516 02/03/13 0549 02/03/13 1850  WBC 12.6* 12.1* 11.6*  HGB 8.2* 7.9* 10.3*  HCT 24.8* 23.3* 29.8*  MCV 92.2 91.4 89.2  PLT 155 144* 192   Cardiac Enzymes:  Recent Labs  02/04/13 0918  TROPONINI <0.30   CBG:  Recent Labs  03/29/12 1420 03/29/12 1758 03/29/12 2131  GLUCAP 105* 136* 150*   CHEST  2 VIEW   COMPARISON:  None   FINDINGS: The heart size and mediastinal contours are within normal limits. Both lungs are clear. The visualized skeletal structures are unremarkable. Surgical clips noted in the left axilla.   IMPRESSION: No active cardiopulmonary disease. LEFT HIP - COMPLETE 2+ VIEW   COMPARISON:  None.   FINDINGS: There is interval placement of a left hip arthroplasty. There is no failure or complication. There is no dislocation.   There is a right hip arthroplasty.   IMPRESSION: Left total hip arthroplasty.   PORTABLE PELVIS 1-2 VIEWS   COMPARISON:  03/29/2012   FINDINGS: Exam demonstrates no change in a right total hip arthroplasty. There has been placement of a left total hip arthroplasty with prosthesis intact and normally located. Remainder of the exam is unchanged.   IMPRESSION: Left total hip arthroplasty intact and normally located.   Stable right total hip  arthroplasty. PORTABLE LEFT HIP - 1 VIEW   COMPARISON:  Spot films from the C-arm of today's date   FINDINGS: The patient has undergone left hip joint prosthesis placement. The appearance of the prosthesis is radiographically good.   IMPRESSION: The patient has undergone left hip joint prosthesis placement. A pre-existing right hip joint prosthesis is present. Further interpretation is for Dr. Ninfa Linden.     LEFT HIP - COMPLETE 2+ VIEW   COMPARISON:  None.   FINDINGS: There is interval placement of a left hip arthroplasty. There is no failure or complication. There is no dislocation. There is a right hip arthroplasty.   IMPRESSION: Left total hip arthroplasty.    ASSESSMENT/PLAN:  Left hip osteoarthritis-status post total hip arthroplasty. Continue rehabilitation Hypertension-well controlled Diabetes mellitus-continue current medications Acute blood loss anemia-reassess GERD-stable Depression-well controlled Check  CBC and BMP  I have reviewed patient's medical records received at admission/from hospitalization.  CPT CODE: 54270

## 2013-02-12 ENCOUNTER — Non-Acute Institutional Stay (SKILLED_NURSING_FACILITY): Payer: Medicare HMO | Admitting: Adult Health

## 2013-02-12 DIAGNOSIS — F419 Anxiety disorder, unspecified: Secondary | ICD-10-CM

## 2013-02-12 DIAGNOSIS — F3289 Other specified depressive episodes: Secondary | ICD-10-CM

## 2013-02-12 DIAGNOSIS — F32A Depression, unspecified: Secondary | ICD-10-CM

## 2013-02-12 DIAGNOSIS — I10 Essential (primary) hypertension: Secondary | ICD-10-CM

## 2013-02-12 DIAGNOSIS — F411 Generalized anxiety disorder: Secondary | ICD-10-CM

## 2013-02-12 DIAGNOSIS — K219 Gastro-esophageal reflux disease without esophagitis: Secondary | ICD-10-CM

## 2013-02-12 DIAGNOSIS — F329 Major depressive disorder, single episode, unspecified: Secondary | ICD-10-CM

## 2013-02-12 DIAGNOSIS — M1610 Unilateral primary osteoarthritis, unspecified hip: Secondary | ICD-10-CM

## 2013-02-12 DIAGNOSIS — J189 Pneumonia, unspecified organism: Secondary | ICD-10-CM

## 2013-02-14 DIAGNOSIS — F419 Anxiety disorder, unspecified: Secondary | ICD-10-CM | POA: Insufficient documentation

## 2013-02-14 NOTE — Progress Notes (Signed)
Patient ID: Rebecca Lowery, female   DOB: 22-Nov-1940, 73 y.o.   MRN: [id number]               PROGRESS NOTE  DATE: 02/12/13  FACILITY: Nursing Home Location: [facility]  LEVEL OF CARE: SNF (31)  Acute Visit  CHIEF COMPLAINT:  Discharge Notes  HISTORY OF PRESENT ILLNESS: This is a 73 year old female who is for discharge home. She was having occasional cough and chest x-ray shows patchy atelectasis or pneumonitis is seen at the right lung base. She has been admitted to [facility] on 02/04/13 from [facility] with Osteoarthritis S/P Left total hip arthroplasty. Patient was admitted to this facility for short-term rehabilitation after the patient's recent hospitalization.  Patient has completed SNF rehabilitation and therapy has cleared the patient for discharge.  REASSESSMENT OF ONGOING PROBLEM(S):  HTN: Pt 's HTN remains stable.  Denies CP, sob, DOE, pedal edema, headaches, dizziness or visual disturbances.  No complications from the medications currently being used.  Last BP :144/65  ANXIETY: The anxiety remains stable. Patient denies ongoing anxiety or irritability. No complications reported from the medications currently being used.  GERD: pt's GERD is stable.  Denies ongoing heartburn, abd. Pain, nausea or vomiting.  Currently on a PPI & tolerates it without any adverse reactions.   PAST MEDICAL HISTORY : Reviewed.  No changes.  CURRENT MEDICATIONS: Reviewed per [facility]  REVIEW OF SYSTEMS:  GENERAL: no change in appetite, no fatigue, no weight changes, no fever, chills or weakness RESPIRATORY: no SOB, DOE, wheezing, hemoptysis, +cough CARDIAC: no chest pain, edema or palpitations GI: no abdominal pain, diarrhea, constipation, heart burn, nausea or vomiting  PHYSICAL EXAMINATION  VS:  T97.8       P94      RR20      BP144/65     POX 97%     WT 173(Lb)  GENERAL: no acute distress, normal body habitus NECK: supple, trachea midline, no neck masses, no  thyroid tenderness, no thyromegaly LYMPHATICS: no LAN in the neck, no supraclavicular LAN RESPIRATORY: breathing is even & unlabored, BS CTAB CARDIAC: RRR, no murmur,no extra heart sounds, no edema GI: abdomen soft, normal BS, no masses, no tenderness, no hepatomegaly, no splenomegaly PSYCHIATRIC: the patient is alert & oriented to person, affect & behavior appropriate  LABS/RADIOLOGY: Labs reviewed: Basic Metabolic Panel:  Recent Labs  03/30/12 0536 01/27/13 1425 02/01/13 0526  NA 135 138 136*  K 4.2 4.4 4.1  CL 100 100 101  CO2 30 27 27   GLUCOSE 146* 104* 140*  BUN 16 25* 17  CREATININE 0.64 0.60 0.53  CALCIUM 8.8 10.4 8.1*   CBC:  Recent Labs  02/02/13 0516 02/03/13 0549 02/03/13 1850  WBC 12.6* 12.1* 11.6*  HGB 8.2* 7.9* 10.3*  HCT 24.8* 23.3* 29.8*  MCV 92.2 91.4 89.2  PLT 155 144* 192   Cardiac Enzymes:  Recent Labs  02/04/13 0918  TROPONINI <0.30   CBG:  Recent Labs  03/29/12 1420 03/29/12 1758 03/29/12 2131  GLUCAP 105* 136* 150*    ASSESSMENT/PLAN:  Osteoarthritis status post left total hip arthroplasty - has completed rehabilitation  Depression - stable; continue Wellbutrin   Anxiety - stable; continue Ativan   Hypertension - well-controlled; continue Cozaar  GERD - continue Prilosec  Pneumonitis - start Doxycycline 100 mg PO BID x 10 days  Total discharge time: Less than 30 minutes Discharge time involved coordination of the discharge process with Education officer, museum, nursing staff  and therapy department.   CPT CODE: 41660                  PROGRESS NOTE  DATE: 02/12/2013   FACILITY: [facility]  LEVEL OF CARE: SNF (31)  Discharge Visit  CHIEF COMPLAINT:  Manage  HISTORY OF PRESENT ILLNESS: I was requested by the social worker to perform face-to-face evaluation for discharge:  Patient was admitted to this facility for short-term rehabilitation after the patient's recent hospitalization.  Patient has  completed SNF rehabilitation and therapy has cleared the patient for discharge.  Reassessment of ongoing problem(s):  PAST MEDICAL HISTORY : Reviewed.  No changes.  CURRENT MEDICATIONS: Reviewed per [facility]  REVIEW OF SYSTEMS:  GENERAL: no change in appetite, no fatigue, no weight changes, no fever, chills or weakness RESPIRATORY: no cough, SOB, DOE, wheezing, hemoptysis CARDIAC: no chest pain, edema or palpitations GI: no abdominal pain, diarrhea, constipation, heart burn, nausea or vomiting  PHYSICAL EXAMINATION  VS:  T       P       RR      BP      POX %       WT (Lb)  GENERAL: no acute distress, normal body habitus EYES: conjunctivae normal, sclerae normal, normal eye lids NECK: supple, trachea midline, no neck masses, no thyroid tenderness, no thyromegaly LYMPHATICS: no LAN in the neck, no supraclavicular LAN RESPIRATORY: breathing is even & unlabored, BS CTAB CARDIAC: RRR, no murmur,no extra heart sounds, no edema GI: abdomen soft, normal BS, no masses, no tenderness, no hepatomegaly, no splenomegaly PSYCHIATRIC: the patient is alert & oriented to person, affect & behavior appropriate  LABS/RADIOLOGY:  ASSESSMENT/PLAN:  I have filled out patient's discharge paperwork and written prescriptions.  Patient will receive home health PT, OT, ST, nursing and CNA. DME provided:  Total discharge time: Greater/less than 30 minutes Discharge time involved coordination of the discharge process with Education officer, museum, nursing staff and therapy department. Medical justification for home health services/DME verified.  CPT CODE: [phone]

## 2013-02-14 NOTE — Progress Notes (Signed)
Patient ID: Rebecca Lowery, female   DOB: 1940/04/08, 73 y.o.   MRN: [id number]               PROGRESS NOTE  DATE: 02/05/2013  FACILITY: Nursing Home Location: [facility]  LEVEL OF CARE: SNF (31)  Acute Visit  CHIEF COMPLAINT:  Follow-up Hospitalization  HISTORY OF PRESENT ILLNESS: This is a 73 year old female who has been admitted to [facility] on 02/04/13 from [facility] with Osteoarthritis S/P Left total hip arthroplasty. She has been admitted for a short-term rehabilitation.  REASSESSMENT OF ONGOING PROBLEM(S):  HTN: Pt 's HTN remains stable.  Denies CP, sob, DOE, pedal edema, headaches, dizziness or visual disturbances.  No complications from the medications currently being used.  Last BP :135/79  GERD: pt's GERD is stable.  Denies ongoing heartburn, abd. Pain, nausea or vomiting.  Currently on a PPI & tolerates it without any adverse reactions.  DEPRESSION: The depression remains stable. Patient denies ongoing feelings of sadness, insomnia, anedhonia or lack of appetite. No complications reported from the medications currently being used. Staff do not report behavioral problems.  PAST MEDICAL HISTORY : Reviewed.  No changes.  CURRENT MEDICATIONS: Reviewed per [facility]  REVIEW OF SYSTEMS:  GENERAL: no change in appetite, no fatigue, no weight changes, no fever, chills or weakness RESPIRATORY: no cough, SOB, DOE, wheezing, hemoptysis CARDIAC: no chest pain, edema or palpitations GI: no abdominal pain, diarrhea, constipation, heart burn, nausea or vomiting  PHYSICAL EXAMINATION  VS:  T98.1       P98      RR22      BP135/79     POX 97%     WT 170(Lb)  GENERAL: no acute distress, normal body habitus EYES: conjunctivae normal, sclerae normal, normal eye lids NECK: supple, trachea midline, no neck masses, no thyroid tenderness, no thyromegaly LYMPHATICS: no LAN in the neck, no supraclavicular LAN RESPIRATORY: breathing is even & unlabored, BS  CTAB CARDIAC: RRR, no murmur,no extra heart sounds, no edema GI: abdomen soft, normal BS, no masses, no tenderness, no hepatomegaly, no splenomegaly PSYCHIATRIC: the patient is alert & oriented to person, affect & behavior appropriate  LABS/RADIOLOGY: Labs reviewed: Basic Metabolic Panel:  Recent Labs  03/30/12 0536 01/27/13 1425 02/01/13 0526  NA 135 138 136*  K 4.2 4.4 4.1  CL 100 100 101  CO2 30 27 27   GLUCOSE 146* 104* 140*  BUN 16 25* 17  CREATININE 0.64 0.60 0.53  CALCIUM 8.8 10.4 8.1*   CBC:  Recent Labs  02/02/13 0516 02/03/13 0549 02/03/13 1850  WBC 12.6* 12.1* 11.6*  HGB 8.2* 7.9* 10.3*  HCT 24.8* 23.3* 29.8*  MCV 92.2 91.4 89.2  PLT 155 144* 192   Cardiac Enzymes:  Recent Labs  02/04/13 0918  TROPONINI <0.30   CBG:  Recent Labs  03/29/12 1420 03/29/12 1758 03/29/12 2131  GLUCAP 105* 136* 150*    ASSESSMENT/PLAN:  Osteoarthritis status post left total hip arthroplasty - for rehabilitation  Depression - stable; continue Wellbutrin and discontinue Abilify  Anxiety - stable; continue Ativan and discontinue Klonopin and Doxepin  Hypertension - well-controlled; continue Cozaar  GERD - continue Prilosec    CPT CODE: [id number]

## 2013-03-19 ENCOUNTER — Telehealth: Payer: Self-pay | Admitting: Hematology and Oncology

## 2013-03-19 NOTE — Telephone Encounter (Signed)
called patient to schedule new patient appt no able to leave message will call back.

## 2013-03-25 ENCOUNTER — Telehealth: Payer: Self-pay | Admitting: Hematology and Oncology

## 2013-03-25 NOTE — Telephone Encounter (Signed)
S/W PT AND GAVE NEW PATIENT AAPPT FOR 03/13 @ 1:30 W/DR. SHADAD.  [place] PACKET MAILED

## 2013-03-25 NOTE — Telephone Encounter (Signed)
C/D 03/25/13 for appt. 04/04/13 °

## 2013-04-04 ENCOUNTER — Ambulatory Visit (HOSPITAL_BASED_OUTPATIENT_CLINIC_OR_DEPARTMENT_OTHER): Payer: Medicare HMO | Admitting: Hematology and Oncology

## 2013-04-04 ENCOUNTER — Other Ambulatory Visit: Payer: Self-pay | Admitting: Hematology and Oncology

## 2013-04-04 ENCOUNTER — Ambulatory Visit: Payer: Medicare HMO

## 2013-04-04 ENCOUNTER — Encounter: Payer: Self-pay | Admitting: Hematology and Oncology

## 2013-04-04 VITALS — BP 174/77 | HR 113 | Temp 98.6°F | Resp 18 | Ht 62.0 in | Wt 165.4 lb

## 2013-04-04 DIAGNOSIS — F3289 Other specified depressive episodes: Secondary | ICD-10-CM

## 2013-04-04 DIAGNOSIS — F329 Major depressive disorder, single episode, unspecified: Secondary | ICD-10-CM

## 2013-04-04 DIAGNOSIS — D72829 Elevated white blood cell count, unspecified: Secondary | ICD-10-CM

## 2013-04-04 DIAGNOSIS — Z23 Encounter for immunization: Secondary | ICD-10-CM

## 2013-04-04 HISTORY — DX: Encounter for immunization: Z23

## 2013-04-04 MED ORDER — PNEUMOCOCCAL VAC POLYVALENT 25 MCG/0.5ML IJ INJ
0.5000 mL | INJECTION | Freq: Once | INTRAMUSCULAR | Status: AC
  Administered 2013-04-04: 0.5 mL via INTRAMUSCULAR
  Filled 2013-04-04: qty 0.5

## 2013-04-04 NOTE — Progress Notes (Signed)
Checked in new pt with no financial concerns. °

## 2013-04-04 NOTE — Progress Notes (Signed)
[place] NOTE  Patient Care Team: Tivis Ringer, MD as PCP - General (Internal Medicine)  CHIEF COMPLAINTS/PURPOSE OF CONSULTATION:  Chronic leukocytosis  HISTORY OF PRESENTING ILLNESS:  Rebecca Lowery 73 y.o. female is here because of elevated WBC.  She was found to have abnormal CBC from routine blood work She denies recent infection. The last prescription antibiotics was more than 1 month ago. The patient had hip replacement surgery complicated by pneumonia postoperatively. It resolved with antibiotic therapy. There is not reported symptoms of sinus congestion, cough, urinary frequency/urgency or dysuria, diarrhea, joint swelling/pain or abnormal skin rash.  She had prior history of breast cancer. According to the patient, she had breast cancer on the left diagnosed from screening mammogram. She had a mastectomy followed by adjuvant endocrine therapy. Her last screening mammogram on the right side was normal.  Her age appropriate screening programs are up-to-date. The patient has no prior diagnosis of autoimmune disease and was not prescribed corticosteroids related products.  The patient is undergoing significant grieving process since the death of her husband 6 months ago. They were married for 50 years. She felt that her husband had suffered during his treatments and would not  want anything to do with the diagnosis of cancer right now. 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  MEDICAL HISTORY:  Past Medical History  Diagnosis Date  . Insomnia   . Anxiety   . Depression   . Chest pain     HX OF CHEST PAIN --NUCLEAR STRESS TEST 04/19/11 NORMAL--PT STATES SHE WAS TOLD STRESS MAY BE THE CAUSE OF HER CHEST PAINS  . Hypertension   . Sleep apnea     HAS CPAP MACHINE-BUT NO LONGER USES  . Heart murmur     MVP - PT THINKS HER CHEST PAIN IS RELATED TO HER MVP  . GERD (gastroesophageal reflux disease)   .  Cancer 1991    LEFT MASTECTOMY FOR BREAST CANCER--PT NOT SURE IF SHE IS TO AVOID NEEDLES LEFT ARM-DID HAVE AXILLARY LYMPH NODES REMOVED  . Arthritis     OA BOTH HIPS, KNEES AND BACK.  SEVER PAIN IN RIGHT HIP  . Family history of anesthesia complication     PT'S SON HAD ANESTHESIA PROBLEM WITH EXCISION OF WISDOM TEETH ---THRASING AROUND--BUT DID GO HOME SAME DAY OF PROCEDURE  . Anginal pain     09/2012, checked out and was told it was stress  . Breast cancer     left mastectomy detected on mammogram  . Need for vaccination against Streptococcus pneumoniae 04/04/2013    SURGICAL HISTORY: Past Surgical History  Procedure Laterality Date  . Tonsillectomy      AS A TEENAGER  . Cholecystectomy    . Breast surgery      LEFT MASTECTOMY AND AXILLARY DISSECTION FOR BREAST CANCER  . Abdominal hysterectomy    . Total hip arthroplasty Right 03/29/2012    Procedure: Right TOTAL HIP ARTHROPLASTY ANTERIOR APPROACH;  Surgeon: Mcarthur Rossetti, MD;  Location: [facility];  Service: Orthopedics;  Laterality: Right;  . Total hip arthroplasty Left 01/31/2013    Procedure: LEFT TOTAL HIP ARTHROPLASTY ANTERIOR APPROACH;  Surgeon: Mcarthur Rossetti, MD;  Location: [facility];  Service: Orthopedics;  Laterality: Left;    SOCIAL HISTORY: History   Social History  . Marital Status: Widowed    Spouse Name: Eddie Dibbles    Number of Children: N/A  . Years of Education:  N/A   Occupational History  . Not on file.   Social History Main Topics  . Smoking status: Never Smoker   . Smokeless tobacco: Never Used  . Alcohol Use: Yes     Comment: rare glass of wine  . Drug Use: No  . Sexual Activity: Not on file   Other Topics Concern  . Not on file   Social History Narrative   Lives with her husband, Aloise Copus, in [place]   He is being treated for multiple myeloma    FAMILY HISTORY: Family History  Problem Relation Age of Onset  . Cancer Mother     breast ca    ALLERGIES:  is allergic to  ciprofloxacin and quinolones.  MEDICATIONS:  Current Outpatient Prescriptions  Medication Sig Dispense Refill  . beta carotene w/minerals (OCUVITE) tablet Take 1 tablet by mouth daily.      Marland Kitchen buPROPion (WELLBUTRIN XL) 150 MG 24 hr tablet Take 450 mg by mouth every morning.      . Calcium Carb-Cholecalciferol (CALCIUM 600 + D PO) Take 1 tablet by mouth daily.      . diphenhydrAMINE (BENADRYL) 25 MG tablet Take 25 mg by mouth at bedtime as needed for sleep.      Marland Kitchen loratadine (CLARITIN) 10 MG tablet Take 10 mg by mouth at bedtime.      Marland Kitchen LORazepam (ATIVAN) 0.5 MG tablet Take 1.5 mg by mouth at bedtime. Takes 3 tablets at night      . losartan (COZAAR) 100 MG tablet Take 100 mg by mouth daily.       . methocarbamol (ROBAXIN) 500 MG tablet Take 1 tablet (500 mg total) by mouth 3 (three) times daily.  40 tablet  0  . omeprazole (PRILOSEC OTC) 20 MG tablet Take 20 mg by mouth at bedtime.       Marland Kitchen oxyCODONE-acetaminophen (ROXICET) 5-325 MG per tablet Take 1-2 tablets by mouth every 4 (four) hours as needed for severe pain.  60 tablet  0  . zolpidem (AMBIEN) 10 MG tablet Take 20 mg by mouth at bedtime.        Current Facility-Administered Medications  Medication Dose Route Frequency Provider Last Rate Last Dose  . pneumococcal 23 valent vaccine (PNU-IMMUNE) injection 0.5 mL  0.5 mL Intramuscular Once Heath Lark, MD        REVIEW OF SYSTEMS:   Constitutional: Denies fevers, chills or abnormal night sweats Eyes: Denies blurriness of vision, double vision or watery eyes Ears, nose, mouth, throat, and face: Denies mucositis or sore throat Respiratory: Denies cough, dyspnea or wheezes Cardiovascular: Denies palpitation, chest discomfort or lower extremity swelling Gastrointestinal:  Denies nausea, heartburn or change in bowel habits Skin: Denies abnormal skin rashes Lymphatics: Denies new lymphadenopathy or easy bruising Neurological:Denies numbness, tingling or new weaknesses Behavioral/Psych: Mood  is stable, no new changes . She denies suicidal ideation All other systems were reviewed with the patient and are negative.  PHYSICAL EXAMINATION: ECOG PERFORMANCE STATUS: 0 - Asymptomatic  Filed Vitals:   04/04/13 1408  BP: 174/77  Pulse: 113  Temp: 98.6 F (37 C)  Resp: 18   Filed Weights   04/04/13 1408  Weight: 165 lb 6.4 oz (75.025 kg)    GENERAL:alert, no distress and comfortable SKIN: skin color, texture, turgor are normal, no rashes or significant lesions EYES: normal, conjunctiva are pink and non-injected, sclera clear OROPHARYNX:no exudate, no erythema and lips, buccal mucosa, and tongue normal  NECK: supple, thyroid normal size, non-tender, without nodularity  LYMPH:  no palpable lymphadenopathy in the cervical, axillary or inguinal LUNGS: clear to auscultation and percussion with normal breathing effort HEART: regular rate & rhythm and no murmurs and no lower extremity edema ABDOMEN:abdomen soft, non-tender and normal bowel sounds Musculoskeletal:no cyanosis of digits and no clubbing  PSYCH: alert & oriented x 3 with fluent speech. 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 Per her own wishes, I did not mention my suspicion/diagnosis. At most, the patient is at clinical RAI stage 0. She had abnormal leukocytosis dated back to 2013 with no evidence of progression of leukocytosis. I felt that she can be observed by her primary care provider especially that the patient does not want to come back here to the [place] to be followed. I gave her my business card. If she change her mind in the future, or has new evidence of progressive anemia, thrombocytopenia, on  new lymphadenopathy, I will be glad to see her back in the future. #2 anemia, resolved #3 history of breast cancer, staging unknown, no evidence of disease She will continue history, physical examination and the mammogram with her primary care provider #4 recent pneumonia I recommend pneumococcal vaccination and she agreed #5 depression She denies suicidal ideation. Overall, the depression is improving according to the patient. #6 recent hip surgery I recommend she continue calcium with vitamin D supplement.  Spent 45 minutes with the patient on counseling and review.

## 2013-06-23 ENCOUNTER — Ambulatory Visit (HOSPITAL_COMMUNITY): Admission: RE | Admit: 2013-06-23 | Payer: Medicare HMO | Source: Home / Self Care | Admitting: Psychiatry

## 2013-08-18 ENCOUNTER — Other Ambulatory Visit: Payer: Self-pay

## 2013-08-18 DIAGNOSIS — Z9012 Acquired absence of left breast and nipple: Secondary | ICD-10-CM

## 2013-08-18 DIAGNOSIS — Z1231 Encounter for screening mammogram for malignant neoplasm of breast: Secondary | ICD-10-CM

## 2013-08-26 ENCOUNTER — Ambulatory Visit
Admission: RE | Admit: 2013-08-26 | Discharge: 2013-08-26 | Disposition: A | Discharge status: Home / Self Care | Payer: Commercial Managed Care - HMO | Source: Ambulatory Visit

## 2013-08-26 DIAGNOSIS — Z1231 Encounter for screening mammogram for malignant neoplasm of breast: Secondary | ICD-10-CM

## 2013-08-26 DIAGNOSIS — Z9012 Acquired absence of left breast and nipple: Secondary | ICD-10-CM

## 2013-08-28 ENCOUNTER — Other Ambulatory Visit: Payer: Self-pay | Admitting: Internal Medicine

## 2013-08-28 DIAGNOSIS — R928 Other abnormal and inconclusive findings on diagnostic imaging of breast: Secondary | ICD-10-CM

## 2013-08-29 ENCOUNTER — Institutional Professional Consult (permissible substitution): Payer: Commercial Managed Care - HMO | Admitting: Neurology

## 2013-08-29 ENCOUNTER — Institutional Professional Consult (permissible substitution): Payer: Medicare HMO | Admitting: Neurology

## 2013-09-12 ENCOUNTER — Other Ambulatory Visit: Payer: Self-pay

## 2013-12-04 ENCOUNTER — Other Ambulatory Visit: Payer: Self-pay

## 2014-02-09 DIAGNOSIS — M542 Cervicalgia: Secondary | ICD-10-CM | POA: Diagnosis not present

## 2014-02-09 DIAGNOSIS — M47812 Spondylosis without myelopathy or radiculopathy, cervical region: Secondary | ICD-10-CM | POA: Diagnosis not present

## 2014-03-18 DIAGNOSIS — M47812 Spondylosis without myelopathy or radiculopathy, cervical region: Secondary | ICD-10-CM | POA: Diagnosis not present

## 2014-03-20 DIAGNOSIS — E119 Type 2 diabetes mellitus without complications: Secondary | ICD-10-CM | POA: Diagnosis not present

## 2014-03-20 DIAGNOSIS — I10 Essential (primary) hypertension: Secondary | ICD-10-CM | POA: Diagnosis not present

## 2014-03-20 DIAGNOSIS — E785 Hyperlipidemia, unspecified: Secondary | ICD-10-CM | POA: Diagnosis not present

## 2014-03-20 DIAGNOSIS — G4700 Insomnia, unspecified: Secondary | ICD-10-CM | POA: Diagnosis not present

## 2014-03-20 DIAGNOSIS — R42 Dizziness and giddiness: Secondary | ICD-10-CM | POA: Diagnosis not present

## 2014-03-20 DIAGNOSIS — M542 Cervicalgia: Secondary | ICD-10-CM | POA: Diagnosis not present

## 2014-03-30 DIAGNOSIS — M542 Cervicalgia: Secondary | ICD-10-CM | POA: Diagnosis not present

## 2014-03-30 DIAGNOSIS — M47812 Spondylosis without myelopathy or radiculopathy, cervical region: Secondary | ICD-10-CM | POA: Diagnosis not present

## 2014-03-31 DIAGNOSIS — F331 Major depressive disorder, recurrent, moderate: Secondary | ICD-10-CM | POA: Diagnosis not present

## 2014-04-02 DIAGNOSIS — R42 Dizziness and giddiness: Secondary | ICD-10-CM | POA: Diagnosis not present

## 2014-04-02 DIAGNOSIS — E119 Type 2 diabetes mellitus without complications: Secondary | ICD-10-CM | POA: Diagnosis not present

## 2014-04-02 DIAGNOSIS — M1990 Unspecified osteoarthritis, unspecified site: Secondary | ICD-10-CM | POA: Diagnosis not present

## 2014-04-02 DIAGNOSIS — N390 Urinary tract infection, site not specified: Secondary | ICD-10-CM | POA: Diagnosis not present

## 2014-04-02 DIAGNOSIS — K219 Gastro-esophageal reflux disease without esophagitis: Secondary | ICD-10-CM | POA: Diagnosis not present

## 2014-04-02 DIAGNOSIS — I10 Essential (primary) hypertension: Secondary | ICD-10-CM | POA: Diagnosis not present

## 2014-04-02 DIAGNOSIS — M542 Cervicalgia: Secondary | ICD-10-CM | POA: Diagnosis not present

## 2014-04-02 DIAGNOSIS — E785 Hyperlipidemia, unspecified: Secondary | ICD-10-CM | POA: Diagnosis not present

## 2014-04-02 DIAGNOSIS — Z0000 Encounter for general adult medical examination without abnormal findings: Secondary | ICD-10-CM | POA: Diagnosis not present

## 2014-04-07 DIAGNOSIS — D0439 Carcinoma in situ of skin of other parts of face: Secondary | ICD-10-CM | POA: Diagnosis not present

## 2014-04-07 DIAGNOSIS — D045 Carcinoma in situ of skin of trunk: Secondary | ICD-10-CM | POA: Diagnosis not present

## 2014-04-07 DIAGNOSIS — L821 Other seborrheic keratosis: Secondary | ICD-10-CM | POA: Diagnosis not present

## 2014-04-07 DIAGNOSIS — D485 Neoplasm of uncertain behavior of skin: Secondary | ICD-10-CM | POA: Diagnosis not present

## 2014-04-08 ENCOUNTER — Ambulatory Visit
Admission: RE | Admit: 2014-04-08 | Discharge: 2014-04-08 | Disposition: A | Discharge status: Home / Self Care | Payer: Commercial Managed Care - HMO | Source: Ambulatory Visit | Attending: Internal Medicine | Admitting: Internal Medicine

## 2014-04-08 DIAGNOSIS — R928 Other abnormal and inconclusive findings on diagnostic imaging of breast: Secondary | ICD-10-CM

## 2014-04-20 DIAGNOSIS — M542 Cervicalgia: Secondary | ICD-10-CM | POA: Diagnosis not present

## 2014-04-20 DIAGNOSIS — M47812 Spondylosis without myelopathy or radiculopathy, cervical region: Secondary | ICD-10-CM | POA: Diagnosis not present

## 2014-04-20 DIAGNOSIS — Z6833 Body mass index (BMI) 33.0-33.9, adult: Secondary | ICD-10-CM | POA: Diagnosis not present

## 2014-06-03 DIAGNOSIS — M47812 Spondylosis without myelopathy or radiculopathy, cervical region: Secondary | ICD-10-CM | POA: Diagnosis not present

## 2014-06-03 DIAGNOSIS — M542 Cervicalgia: Secondary | ICD-10-CM | POA: Diagnosis not present

## 2014-06-04 DIAGNOSIS — F331 Major depressive disorder, recurrent, moderate: Secondary | ICD-10-CM | POA: Diagnosis not present

## 2014-07-06 DIAGNOSIS — F331 Major depressive disorder, recurrent, moderate: Secondary | ICD-10-CM | POA: Diagnosis not present

## 2014-09-17 DIAGNOSIS — F331 Major depressive disorder, recurrent, moderate: Secondary | ICD-10-CM | POA: Diagnosis not present

## 2014-09-18 DIAGNOSIS — H3531 Nonexudative age-related macular degeneration: Secondary | ICD-10-CM | POA: Diagnosis not present

## 2014-09-30 DIAGNOSIS — N390 Urinary tract infection, site not specified: Secondary | ICD-10-CM | POA: Diagnosis not present

## 2014-09-30 DIAGNOSIS — K589 Irritable bowel syndrome without diarrhea: Secondary | ICD-10-CM | POA: Diagnosis not present

## 2014-09-30 DIAGNOSIS — G4700 Insomnia, unspecified: Secondary | ICD-10-CM | POA: Diagnosis not present

## 2014-09-30 DIAGNOSIS — F329 Major depressive disorder, single episode, unspecified: Secondary | ICD-10-CM | POA: Diagnosis not present

## 2014-09-30 DIAGNOSIS — Z23 Encounter for immunization: Secondary | ICD-10-CM | POA: Diagnosis not present

## 2014-09-30 DIAGNOSIS — K219 Gastro-esophageal reflux disease without esophagitis: Secondary | ICD-10-CM | POA: Diagnosis not present

## 2014-09-30 DIAGNOSIS — Z6833 Body mass index (BMI) 33.0-33.9, adult: Secondary | ICD-10-CM | POA: Diagnosis not present

## 2014-09-30 DIAGNOSIS — E119 Type 2 diabetes mellitus without complications: Secondary | ICD-10-CM | POA: Diagnosis not present

## 2014-09-30 DIAGNOSIS — G4733 Obstructive sleep apnea (adult) (pediatric): Secondary | ICD-10-CM | POA: Diagnosis not present

## 2014-10-01 DIAGNOSIS — Z0100 Encounter for examination of eyes and vision without abnormal findings: Secondary | ICD-10-CM | POA: Diagnosis not present

## 2014-10-21 DIAGNOSIS — R109 Unspecified abdominal pain: Secondary | ICD-10-CM | POA: Diagnosis not present

## 2014-10-21 DIAGNOSIS — D72829 Elevated white blood cell count, unspecified: Secondary | ICD-10-CM | POA: Diagnosis not present

## 2014-10-23 DIAGNOSIS — L820 Inflamed seborrheic keratosis: Secondary | ICD-10-CM | POA: Diagnosis not present

## 2014-10-23 DIAGNOSIS — L821 Other seborrheic keratosis: Secondary | ICD-10-CM | POA: Diagnosis not present

## 2014-10-23 DIAGNOSIS — D485 Neoplasm of uncertain behavior of skin: Secondary | ICD-10-CM | POA: Diagnosis not present

## 2014-10-23 DIAGNOSIS — L570 Actinic keratosis: Secondary | ICD-10-CM | POA: Diagnosis not present

## 2014-10-23 DIAGNOSIS — Z85828 Personal history of other malignant neoplasm of skin: Secondary | ICD-10-CM | POA: Diagnosis not present

## 2014-10-28 DIAGNOSIS — Z9049 Acquired absence of other specified parts of digestive tract: Secondary | ICD-10-CM | POA: Diagnosis not present

## 2014-10-28 DIAGNOSIS — K5730 Diverticulosis of large intestine without perforation or abscess without bleeding: Secondary | ICD-10-CM | POA: Diagnosis not present

## 2014-10-28 DIAGNOSIS — K7689 Other specified diseases of liver: Secondary | ICD-10-CM | POA: Diagnosis not present

## 2014-10-29 DIAGNOSIS — F331 Major depressive disorder, recurrent, moderate: Secondary | ICD-10-CM | POA: Diagnosis not present

## 2014-11-09 ENCOUNTER — Institutional Professional Consult (permissible substitution): Payer: Medicare HMO | Admitting: Neurology

## 2014-11-11 DIAGNOSIS — H524 Presbyopia: Secondary | ICD-10-CM | POA: Diagnosis not present

## 2014-11-11 DIAGNOSIS — H5210 Myopia, unspecified eye: Secondary | ICD-10-CM | POA: Diagnosis not present

## 2014-11-19 DIAGNOSIS — E119 Type 2 diabetes mellitus without complications: Secondary | ICD-10-CM | POA: Diagnosis not present

## 2014-11-19 DIAGNOSIS — J309 Allergic rhinitis, unspecified: Secondary | ICD-10-CM | POA: Diagnosis not present

## 2014-11-19 DIAGNOSIS — R05 Cough: Secondary | ICD-10-CM | POA: Diagnosis not present

## 2014-11-19 DIAGNOSIS — J029 Acute pharyngitis, unspecified: Secondary | ICD-10-CM | POA: Diagnosis not present

## 2014-11-19 DIAGNOSIS — F329 Major depressive disorder, single episode, unspecified: Secondary | ICD-10-CM | POA: Diagnosis not present

## 2014-11-19 DIAGNOSIS — I10 Essential (primary) hypertension: Secondary | ICD-10-CM | POA: Diagnosis not present

## 2014-12-11 DIAGNOSIS — C9110 Chronic lymphocytic leukemia of B-cell type not having achieved remission: Secondary | ICD-10-CM | POA: Diagnosis not present

## 2014-12-11 DIAGNOSIS — F330 Major depressive disorder, recurrent, mild: Secondary | ICD-10-CM | POA: Diagnosis not present

## 2014-12-11 DIAGNOSIS — Z881 Allergy status to other antibiotic agents status: Secondary | ICD-10-CM | POA: Diagnosis not present

## 2014-12-11 DIAGNOSIS — I10 Essential (primary) hypertension: Secondary | ICD-10-CM | POA: Diagnosis not present

## 2014-12-11 DIAGNOSIS — F332 Major depressive disorder, recurrent severe without psychotic features: Secondary | ICD-10-CM | POA: Diagnosis not present

## 2014-12-11 DIAGNOSIS — R079 Chest pain, unspecified: Secondary | ICD-10-CM | POA: Diagnosis not present

## 2014-12-11 DIAGNOSIS — E119 Type 2 diabetes mellitus without complications: Secondary | ICD-10-CM | POA: Diagnosis not present

## 2014-12-11 DIAGNOSIS — R002 Palpitations: Secondary | ICD-10-CM | POA: Diagnosis not present

## 2014-12-11 DIAGNOSIS — F329 Major depressive disorder, single episode, unspecified: Secondary | ICD-10-CM | POA: Diagnosis not present

## 2014-12-21 ENCOUNTER — Ambulatory Visit (INDEPENDENT_AMBULATORY_CARE_PROVIDER_SITE_OTHER): Payer: Medicare HMO | Admitting: Licensed Clinical Social Worker

## 2014-12-21 DIAGNOSIS — F411 Generalized anxiety disorder: Secondary | ICD-10-CM | POA: Diagnosis not present

## 2014-12-21 DIAGNOSIS — F332 Major depressive disorder, recurrent severe without psychotic features: Secondary | ICD-10-CM | POA: Diagnosis not present

## 2014-12-22 DIAGNOSIS — F331 Major depressive disorder, recurrent, moderate: Secondary | ICD-10-CM | POA: Diagnosis not present

## 2015-01-04 ENCOUNTER — Ambulatory Visit (INDEPENDENT_AMBULATORY_CARE_PROVIDER_SITE_OTHER): Payer: Medicare HMO | Admitting: Licensed Clinical Social Worker

## 2015-01-04 DIAGNOSIS — F332 Major depressive disorder, recurrent severe without psychotic features: Secondary | ICD-10-CM

## 2015-01-04 DIAGNOSIS — F411 Generalized anxiety disorder: Secondary | ICD-10-CM

## 2015-01-11 ENCOUNTER — Ambulatory Visit (INDEPENDENT_AMBULATORY_CARE_PROVIDER_SITE_OTHER): Payer: Medicare HMO | Admitting: Licensed Clinical Social Worker

## 2015-01-11 DIAGNOSIS — F411 Generalized anxiety disorder: Secondary | ICD-10-CM | POA: Diagnosis not present

## 2015-01-11 DIAGNOSIS — F332 Major depressive disorder, recurrent severe without psychotic features: Secondary | ICD-10-CM

## 2015-01-26 ENCOUNTER — Ambulatory Visit (INDEPENDENT_AMBULATORY_CARE_PROVIDER_SITE_OTHER): Payer: Commercial Managed Care - HMO | Admitting: Licensed Clinical Social Worker

## 2015-01-26 DIAGNOSIS — F332 Major depressive disorder, recurrent severe without psychotic features: Secondary | ICD-10-CM

## 2015-01-26 DIAGNOSIS — F411 Generalized anxiety disorder: Secondary | ICD-10-CM

## 2015-02-02 ENCOUNTER — Ambulatory Visit: Payer: Commercial Managed Care - HMO | Admitting: Licensed Clinical Social Worker

## 2015-02-09 ENCOUNTER — Ambulatory Visit (INDEPENDENT_AMBULATORY_CARE_PROVIDER_SITE_OTHER): Payer: Commercial Managed Care - HMO | Admitting: Licensed Clinical Social Worker

## 2015-02-09 DIAGNOSIS — F332 Major depressive disorder, recurrent severe without psychotic features: Secondary | ICD-10-CM | POA: Diagnosis not present

## 2015-02-09 DIAGNOSIS — F411 Generalized anxiety disorder: Secondary | ICD-10-CM | POA: Diagnosis not present

## 2015-02-11 DIAGNOSIS — F331 Major depressive disorder, recurrent, moderate: Secondary | ICD-10-CM | POA: Diagnosis not present

## 2015-02-17 DIAGNOSIS — R1084 Generalized abdominal pain: Secondary | ICD-10-CM | POA: Diagnosis not present

## 2015-02-17 DIAGNOSIS — F329 Major depressive disorder, single episode, unspecified: Secondary | ICD-10-CM | POA: Diagnosis not present

## 2015-02-17 DIAGNOSIS — K589 Irritable bowel syndrome without diarrhea: Secondary | ICD-10-CM | POA: Diagnosis not present

## 2015-02-17 DIAGNOSIS — E119 Type 2 diabetes mellitus without complications: Secondary | ICD-10-CM | POA: Diagnosis not present

## 2015-02-17 DIAGNOSIS — Z6834 Body mass index (BMI) 34.0-34.9, adult: Secondary | ICD-10-CM | POA: Diagnosis not present

## 2015-02-17 DIAGNOSIS — I10 Essential (primary) hypertension: Secondary | ICD-10-CM | POA: Diagnosis not present

## 2015-02-23 ENCOUNTER — Ambulatory Visit (INDEPENDENT_AMBULATORY_CARE_PROVIDER_SITE_OTHER): Payer: Commercial Managed Care - HMO | Admitting: Licensed Clinical Social Worker

## 2015-02-23 DIAGNOSIS — F332 Major depressive disorder, recurrent severe without psychotic features: Secondary | ICD-10-CM | POA: Diagnosis not present

## 2015-02-23 DIAGNOSIS — F411 Generalized anxiety disorder: Secondary | ICD-10-CM | POA: Diagnosis not present

## 2015-02-26 DIAGNOSIS — L821 Other seborrheic keratosis: Secondary | ICD-10-CM | POA: Diagnosis not present

## 2015-02-26 DIAGNOSIS — I788 Other diseases of capillaries: Secondary | ICD-10-CM | POA: Diagnosis not present

## 2015-03-02 ENCOUNTER — Ambulatory Visit: Payer: Medicare HMO | Admitting: Licensed Clinical Social Worker

## 2015-03-09 ENCOUNTER — Ambulatory Visit (INDEPENDENT_AMBULATORY_CARE_PROVIDER_SITE_OTHER): Payer: Medicare HMO | Admitting: Licensed Clinical Social Worker

## 2015-03-09 DIAGNOSIS — F332 Major depressive disorder, recurrent severe without psychotic features: Secondary | ICD-10-CM | POA: Diagnosis not present

## 2015-03-09 DIAGNOSIS — F411 Generalized anxiety disorder: Secondary | ICD-10-CM

## 2015-04-01 DIAGNOSIS — F331 Major depressive disorder, recurrent, moderate: Secondary | ICD-10-CM | POA: Diagnosis not present

## 2015-04-13 ENCOUNTER — Ambulatory Visit (INDEPENDENT_AMBULATORY_CARE_PROVIDER_SITE_OTHER): Payer: Commercial Managed Care - HMO | Admitting: Licensed Clinical Social Worker

## 2015-04-13 DIAGNOSIS — F411 Generalized anxiety disorder: Secondary | ICD-10-CM

## 2015-04-13 DIAGNOSIS — F332 Major depressive disorder, recurrent severe without psychotic features: Secondary | ICD-10-CM | POA: Diagnosis not present

## 2015-04-19 ENCOUNTER — Ambulatory Visit (INDEPENDENT_AMBULATORY_CARE_PROVIDER_SITE_OTHER): Payer: Commercial Managed Care - HMO | Admitting: Licensed Clinical Social Worker

## 2015-04-19 DIAGNOSIS — F332 Major depressive disorder, recurrent severe without psychotic features: Secondary | ICD-10-CM

## 2015-04-19 DIAGNOSIS — F411 Generalized anxiety disorder: Secondary | ICD-10-CM | POA: Diagnosis not present

## 2015-05-03 ENCOUNTER — Ambulatory Visit: Payer: Commercial Managed Care - HMO | Admitting: Licensed Clinical Social Worker

## 2015-07-01 DIAGNOSIS — F331 Major depressive disorder, recurrent, moderate: Secondary | ICD-10-CM | POA: Diagnosis not present

## 2015-07-21 DIAGNOSIS — M4150 Other secondary scoliosis, site unspecified: Secondary | ICD-10-CM | POA: Diagnosis not present

## 2015-07-21 DIAGNOSIS — M25571 Pain in right ankle and joints of right foot: Secondary | ICD-10-CM | POA: Diagnosis not present

## 2015-08-06 DIAGNOSIS — L812 Freckles: Secondary | ICD-10-CM | POA: Diagnosis not present

## 2015-08-06 DIAGNOSIS — D692 Other nonthrombocytopenic purpura: Secondary | ICD-10-CM | POA: Diagnosis not present

## 2015-08-06 DIAGNOSIS — Z85828 Personal history of other malignant neoplasm of skin: Secondary | ICD-10-CM | POA: Diagnosis not present

## 2015-08-06 DIAGNOSIS — L821 Other seborrheic keratosis: Secondary | ICD-10-CM | POA: Diagnosis not present

## 2015-08-06 DIAGNOSIS — D1801 Hemangioma of skin and subcutaneous tissue: Secondary | ICD-10-CM | POA: Diagnosis not present

## 2015-08-16 ENCOUNTER — Encounter: Payer: Self-pay | Admitting: Physical Therapy

## 2015-08-16 ENCOUNTER — Ambulatory Visit: Payer: Commercial Managed Care - HMO | Attending: Orthopaedic Surgery | Admitting: Physical Therapy

## 2015-08-16 DIAGNOSIS — M62830 Muscle spasm of back: Secondary | ICD-10-CM | POA: Diagnosis not present

## 2015-08-16 DIAGNOSIS — R262 Difficulty in walking, not elsewhere classified: Secondary | ICD-10-CM | POA: Diagnosis not present

## 2015-08-16 DIAGNOSIS — M545 Low back pain, unspecified: Secondary | ICD-10-CM

## 2015-08-16 NOTE — Therapy (Signed)
[address] [place], [place], [address] Phone: [phone]   Fax:  [phone]  Physical Therapy Evaluation  Patient Details  Name: Rebecca Lowery MRN: [id number] Date of Birth: [birth year] Referring Provider: Ninfa Linden  Encounter Date: 08/16/2015      PT End of Session - 08/16/15 1328    Visit Number 1   Date for PT Re-Evaluation 10/17/15   PT Start Time 1257   PT Stop Time 1350   PT Time Calculation (min) 53 min   Activity Tolerance Patient tolerated treatment well   Behavior During Therapy [facility] for tasks assessed/performed      Past Medical History:  Diagnosis Date  . Anginal pain ([place])    09/2012, checked out and was told it was stress  . Anxiety   . Arthritis    OA BOTH HIPS, KNEES AND BACK.  SEVER PAIN IN RIGHT HIP  . Breast cancer ([place])    left mastectomy detected on mammogram  . Cancer ([place]) 1991   LEFT MASTECTOMY FOR BREAST CANCER--PT NOT SURE IF SHE IS TO AVOID NEEDLES LEFT ARM-DID HAVE AXILLARY LYMPH NODES REMOVED  . Chest pain    HX OF CHEST PAIN --NUCLEAR STRESS TEST 04/19/11 NORMAL--PT STATES SHE WAS TOLD STRESS MAY BE THE CAUSE OF HER CHEST PAINS  . Depression   . Diabetes mellitus without complication ([place])   . Family history of anesthesia complication    PT'S SON HAD ANESTHESIA PROBLEM WITH EXCISION OF WISDOM TEETH ---THRASING AROUND--BUT DID GO HOME SAME DAY OF PROCEDURE  . GERD (gastroesophageal reflux disease)   . Heart murmur    MVP - PT THINKS HER CHEST PAIN IS RELATED TO HER MVP  . Hemorrhoid   . Hyperlipemia   . Hypertension   . IBS (irritable bowel syndrome)   . IGT (impaired glucose tolerance)   . Insomnia   . Need for vaccination against Streptococcus pneumoniae 04/04/2013  . Sleep apnea    HAS CPAP MACHINE-BUT NO LONGER USES    Past Surgical History:  Procedure Laterality Date  . ABDOMINAL HYSTERECTOMY    . BREAST SURGERY     LEFT MASTECTOMY AND AXILLARY  DISSECTION FOR BREAST CANCER  . CHOLECYSTECTOMY    . MASTECTOMY    . TONSILLECTOMY     AS A TEENAGER  . TOTAL HIP ARTHROPLASTY Right 03/29/2012   Procedure: Right TOTAL HIP ARTHROPLASTY ANTERIOR APPROACH;  Surgeon: Mcarthur Rossetti, MD;  Location: [facility];  Service: Orthopedics;  Laterality: Right;  . TOTAL HIP ARTHROPLASTY Left 01/31/2013   Procedure: LEFT TOTAL HIP ARTHROPLASTY ANTERIOR APPROACH;  Surgeon: Mcarthur Rossetti, MD;  [facility];  Service: Orthopedics;  Laterality: Left;    There were no vitals filed for this visit.       Subjective Assessment - 08/16/15 1259    Subjective Patient brings MD order to treat for degenerative scoliosis.  She reports that she has had some pain in the low back for a few years.  She reports that about two months ago while lifting stuff in the yard.  She reports that her pain has not lessened over this time.   Limitations House hold activities;Standing;Walking;Lifting   Patient Stated Goals have less pain, do more   Currently in Pain? Yes   Pain Score 6    Pain Location Back   Pain Orientation Mid;Lower   Pain Descriptors / Indicators Aching;Tightness   Pain Type Chronic pain   Pain Onset More  than a month ago   Pain Frequency Constant   Aggravating Factors  Doing housework especially bending, the pain will increase to 8-9/10   Pain Relieving Factors maybe heat and rest at best pain a 5/10   Effect of Pain on Daily Activities limits all ADL's            [facility] PT Assessment - 08/16/15 0001      Assessment   Medical Diagnosis low back pain iwth degenerative scoliosis   Referring Provider Ninfa Linden   Onset Date/Surgical Date 07/17/15   Prior Therapy no     Precautions   Precautions Anterior Hip   Precaution Comments bilateral 2014 and 2015     Balance Screen   Has the patient fallen in the past 6 months Yes   How many times? 1   Has the patient had a decrease in activity level because of a fear of falling?  No   Is  the patient reluctant to leave their home because of a fear of falling?  No     Home Environment   Additional Comments does her own housework and yardwork a few steps into the home     Prior Function   Level of Independence Independent     Posture/Postural Control   Posture Comments fwd head, rounded shoulders     ROM / Strength   AROM / PROM / Strength AROM;Strength     AROM   Overall AROM Comments lumbar ROM was decreased 50% for all motions with some pain in the L/S area     Strength   Overall Strength Comments 4/5 for the LE's with some increase of pain     Flexibility   Soft Tissue Assessment /Muscle Length --  very tight in the HS, piriformis and calves     Palpation   Palpation comment she is very tender in the L/S area, very central, has spasms in the lumbar paraspinals                   [facility] Adult PT Treatment/Exercise - 08/16/15 0001      Modalities   Modalities Electrical Stimulation;Moist Heat     Moist Heat Therapy   Number Minutes Moist Heat 15 Minutes   Moist Heat Location Lumbar Spine     Electrical Stimulation   Electrical Stimulation Location lumbar spine   Electrical Stimulation Action IFC   Electrical Stimulation Parameters left side lying   Electrical Stimulation Goals Pain                PT Education - 08/16/15 1328    Education provided Yes   Education Details Wms flexion, HS and piriformis stretches   Person(s) Educated Patient   Methods Explanation;Demonstration;Handout   Comprehension Verbalized understanding          PT Short Term Goals - 08/16/15 1331      PT SHORT TERM GOAL #1   Title independent with initial HEP   Time 2   Period Weeks   Status New           PT Long Term Goals - 08/16/15 1332      PT LONG TERM GOAL #1   Title understand proper posture and body mechanics   Time 8   Period Weeks   Status New     PT LONG TERM GOAL #2   Title increase lumbar ROM 25%   Time 8   Period Weeks    Status New  PT LONG TERM GOAL #3   Title decrease pain 25%   Time 8   Period Weeks   Status New     PT LONG TERM GOAL #4   Title report able to do housework without difficulty   Time 8   Period Weeks   Status New               Plan - 08/16/15 1329    Clinical Impression Statement Patient with a history of low back pain.  She reports that this episode started with her picking stuff up in the yard about 2 months ago, but this pain has not subsided.  She has weak core mms, tightness of the LE's, she also has some significant spasms in the lumbar paraspinals.   Rehab Potential Good   PT Frequency 2x / week   PT Duration 8 weeks   PT Treatment/Interventions ADLs/Self Care Home Management;Cryotherapy;Electrical Stimulation;Iontophoresis 4mg /ml Dexamethasone;Ultrasound;Moist Heat;Therapeutic activities;Therapeutic exercise;Neuromuscular re-education;Patient/family education;Passive range of motion;Manual techniques   PT Next Visit Plan slowly start exercises for flexibility and strength, instruct in proper posture and body mechanics   Consulted and Agree with Plan of Care Patient      Patient will benefit from skilled therapeutic intervention in order to improve the following deficits and impairments:  Decreased activity tolerance, Decreased range of motion, Decreased strength, Difficulty walking, Increased muscle spasms, Impaired flexibility, Postural dysfunction, Improper body mechanics, Pain  Visit Diagnosis: Midline low back pain without sciatica - Plan: PT plan of care cert/re-cert  Muscle spasm of back - Plan: PT plan of care cert/re-cert  Difficulty in walking, not elsewhere classified - Plan: PT plan of care cert/re-cert      G-Codes - [id number]    Functional Assessment Tool Used foto 69% limitation   Functional Limitation Other PT primary   Other PT Primary Current Status [id number]) At least 60 percent but less than 80 percent impaired, limited or restricted        Problem List Patient Active Problem List   Diagnosis Date Noted  . Need for vaccination against Streptococcus pneumoniae 04/04/2013  . Anxiety 02/14/2013  . Acute posthemorrhagic anemia 02/08/2013  . Arthritis pain of left hip 01/31/2013  . Depression 04/03/2012  . Insomnia 04/03/2012  . Generalized anxiety disorder 04/03/2012  . Anticoagulation adequate 04/03/2012  . Hypertension   . Sleep apnea   . Diabetes mellitus without complication ([place])   . GERD (gastroesophageal reflux disease)   . History of breast cancer   . 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

## 2015-08-23 ENCOUNTER — Encounter: Payer: Self-pay | Admitting: Physical Therapy

## 2015-08-23 ENCOUNTER — Ambulatory Visit: Payer: Commercial Managed Care - HMO | Admitting: Physical Therapy

## 2015-08-23 DIAGNOSIS — R262 Difficulty in walking, not elsewhere classified: Secondary | ICD-10-CM | POA: Diagnosis not present

## 2015-08-23 DIAGNOSIS — M545 Low back pain, unspecified: Secondary | ICD-10-CM

## 2015-08-23 DIAGNOSIS — M62830 Muscle spasm of back: Secondary | ICD-10-CM | POA: Diagnosis not present

## 2015-08-23 NOTE — Therapy (Signed)
[facility] [place], [place], [address] Phone: [phone]   Fax:  [phone]  Physical Therapy Treatment  Patient Details  Name: Rebecca Lowery MRN: [id number] Date of Birth: 06-Mar-1940 Referring Provider: Ninfa Linden  Encounter Date: 08/23/2015      PT End of Session - 08/23/15 1450    Visit Number 2   Date for PT Re-Evaluation 10/17/15   PT Start Time 1400   PT Stop Time 1458   PT Time Calculation (min) 58 min   Activity Tolerance Patient tolerated treatment well   Behavior During Therapy [facility] for tasks assessed/performed      Past Medical History:  Diagnosis Date  . Anginal pain ([place])    09/2012, checked out and was told it was stress  . Anxiety   . Arthritis    OA BOTH HIPS, KNEES AND BACK.  SEVER PAIN IN RIGHT HIP  . Breast cancer ([place])    left mastectomy detected on mammogram  . Cancer ([place]) 1991   LEFT MASTECTOMY FOR BREAST CANCER--PT NOT SURE IF SHE IS TO AVOID NEEDLES LEFT ARM-DID HAVE AXILLARY LYMPH NODES REMOVED  . Chest pain    HX OF CHEST PAIN --NUCLEAR STRESS TEST 04/19/11 NORMAL--PT STATES SHE WAS TOLD STRESS MAY BE THE CAUSE OF HER CHEST PAINS  . Depression   . Diabetes mellitus without complication ([place])   . Family history of anesthesia complication    PT'S SON HAD ANESTHESIA PROBLEM WITH EXCISION OF WISDOM TEETH ---THRASING AROUND--BUT DID GO HOME SAME DAY OF PROCEDURE  . GERD (gastroesophageal reflux disease)   . Heart murmur    MVP - PT THINKS HER CHEST PAIN IS RELATED TO HER MVP  . Hemorrhoid   . Hyperlipemia   . Hypertension   . IBS (irritable bowel syndrome)   . IGT (impaired glucose tolerance)   . Insomnia   . Need for vaccination against Streptococcus pneumoniae 04/04/2013  . Sleep apnea    HAS CPAP MACHINE-BUT NO LONGER USES    Past Surgical History:  Procedure Laterality Date  . ABDOMINAL HYSTERECTOMY    . BREAST SURGERY     LEFT MASTECTOMY AND AXILLARY  DISSECTION FOR BREAST CANCER  . CHOLECYSTECTOMY    . MASTECTOMY    . TONSILLECTOMY     AS A TEENAGER  . TOTAL HIP ARTHROPLASTY Right 03/29/2012   Procedure: Right TOTAL HIP ARTHROPLASTY ANTERIOR APPROACH;  Surgeon: Mcarthur Rossetti, MD;  [facility];  Service: Orthopedics;  Laterality: Right;  . TOTAL HIP ARTHROPLASTY Left 01/31/2013   Procedure: LEFT TOTAL HIP ARTHROPLASTY ANTERIOR APPROACH;  Surgeon: Mcarthur Rossetti, MD;  [facility];  Service: Orthopedics;  Laterality: Left;    There were no vitals filed for this visit.      Subjective Assessment - 08/23/15 1357    Subjective Pt reports that she feels okay, heat and Estim helped a lot last time.    Limitations House hold activities;Standing;Walking;Lifting   Patient Stated Goals have less pain, do more   Currently in Pain? Yes   Pain Score 6    Pain Location Back   Pain Orientation Lower                         [facility] Adult PT Treatment/Exercise - 08/23/15 0001      Lumbar Exercises: Stretches   Passive Hamstring Stretch 3 reps;10 seconds   Double Knee to Chest Stretch 3 reps;10  seconds   Lower Trunk Rotation 5 reps;10 seconds     Lumbar Exercises: Seated   Other Seated Lumbar Exercises OHP with weighted yellow ball 2x10   Other Seated Lumbar Exercises Squats/tapping to chair x10     Lumbar Exercises: Supine   Ab Set 10 reps   Bridge 20 reps;1 second   Bridge Limitations From Red pball   Straight Leg Raise 20 reps;1 second   Straight Leg Raises Limitations From Red pball     Shoulder Exercises: Seated   Retraction Strengthening;Both;20 reps;Theraband   Theraband Level (Shoulder Retraction) Level 1 (Yellow)   Protraction Strengthening;Both;20 reps   Theraband Level (Shoulder Protraction) Level 1 (Yellow)     Modalities   Modalities Electrical Stimulation     Moist Heat Therapy   Number Minutes Moist Heat 15 Minutes   Moist Heat Location Lumbar Spine     Electrical  Stimulation   Electrical Stimulation Location lumbar spine   Electrical Stimulation Action IFC   Electrical Stimulation Parameters left side lying   Electrical Stimulation Goals Pain                  PT Short Term Goals - 08/16/15 1331      PT SHORT TERM GOAL #1   Title independent with initial HEP   Time 2   Period Weeks   Status New           PT Long Term Goals - 08/16/15 1332      PT LONG TERM GOAL #1   Title understand proper posture and body mechanics   Time 8   Period Weeks   Status New     PT LONG TERM GOAL #2   Title increase lumbar ROM 25%   Time 8   Period Weeks   Status New     PT LONG TERM GOAL #3   Title decrease pain 25%   Time 8   Period Weeks   Status New     PT LONG TERM GOAL #4   Title report able to do housework without difficulty   Time 8   Period Weeks   Status New               Plan - 08/23/15 1450    Clinical Impression Statement Pt HS and ITB very tight. When stretching RLE, pt was guarded and needed cues to relax. Pt tolerated supine bridging well. Pt required verbal cues to maintain proper posture during OHP and tband exercises in sitting.    Rehab Potential Good   PT Frequency 2x / week   PT Duration 8 weeks   PT Treatment/Interventions ADLs/Self Care Home Management;Cryotherapy;Electrical Stimulation;Iontophoresis 4mg /ml Dexamethasone;Ultrasound;Moist Heat;Therapeutic activities;Therapeutic exercise;Neuromuscular re-education;Patient/family education;Passive range of motion;Manual techniques   PT Next Visit Plan Continue to work on flexibility and add strengthening exercises. Provide check on learning for HEP.    Consulted and Agree with Plan of Care Patient      Patient will benefit from skilled therapeutic intervention in order to improve the following deficits and impairments:  Decreased activity tolerance, Decreased range of motion, Decreased strength, Difficulty walking, Increased muscle spasms, Impaired  flexibility, Postural dysfunction, Improper body mechanics, Pain  Visit Diagnosis: Midline low back pain without sciatica  Muscle spasm of back  Difficulty in walking, not elsewhere classified     Problem List Patient Active Problem List   Diagnosis Date Noted  . Need for vaccination against Streptococcus pneumoniae 04/04/2013  . Anxiety 02/14/2013  . Acute posthemorrhagic anemia  02/08/2013  . Arthritis pain of left hip 01/31/2013  . Depression 04/03/2012  . Insomnia 04/03/2012  . Generalized anxiety disorder 04/03/2012  . Anticoagulation adequate 04/03/2012  . Hypertension   . Sleep apnea   . Diabetes mellitus without complication ([place])   . GERD (gastroesophageal reflux disease)   . History of breast cancer   . 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

## 2015-08-31 DIAGNOSIS — H43813 Vitreous degeneration, bilateral: Secondary | ICD-10-CM | POA: Diagnosis not present

## 2015-08-31 DIAGNOSIS — E119 Type 2 diabetes mellitus without complications: Secondary | ICD-10-CM | POA: Diagnosis not present

## 2015-08-31 DIAGNOSIS — H2513 Age-related nuclear cataract, bilateral: Secondary | ICD-10-CM | POA: Diagnosis not present

## 2015-08-31 DIAGNOSIS — H5213 Myopia, bilateral: Secondary | ICD-10-CM | POA: Diagnosis not present

## 2015-09-02 ENCOUNTER — Ambulatory Visit: Payer: Commercial Managed Care - HMO | Admitting: Physical Therapy

## 2015-09-07 ENCOUNTER — Ambulatory Visit: Payer: Commercial Managed Care - HMO | Attending: Orthopaedic Surgery | Admitting: Physical Therapy

## 2015-09-07 ENCOUNTER — Encounter: Payer: Self-pay | Admitting: Physical Therapy

## 2015-09-07 DIAGNOSIS — M545 Low back pain, unspecified: Secondary | ICD-10-CM

## 2015-09-07 DIAGNOSIS — M62830 Muscle spasm of back: Secondary | ICD-10-CM | POA: Insufficient documentation

## 2015-09-07 DIAGNOSIS — R262 Difficulty in walking, not elsewhere classified: Secondary | ICD-10-CM | POA: Diagnosis not present

## 2015-09-07 NOTE — Therapy (Addendum)
[address] [place], [place], [address] Phone: [phone]   Fax:  [phone]  Physical Therapy Treatment  Patient Details  Name: Rebecca Lowery MRN: [id number] Date of Birth: [birth year] Referring Provider: Ninfa Linden  Encounter Date: 09/07/2015      PT End of Session - 09/07/15 1612    Visit Number 3   Date for PT Re-Evaluation 10/17/15   PT Start Time 0160   PT Stop Time 1530   PT Time Calculation (min) 49 min   Activity Tolerance Patient tolerated treatment well   Behavior During Therapy [facility] for tasks assessed/performed      Past Medical History:  Diagnosis Date  . Anginal pain ([place])    09/2012, checked out and was told it was stress  . Anxiety   . Arthritis    OA BOTH HIPS, KNEES AND BACK.  SEVER PAIN IN RIGHT HIP  . Breast cancer ([place])    left mastectomy detected on mammogram  . Cancer ([place]) 1991   LEFT MASTECTOMY FOR BREAST CANCER--PT NOT SURE IF SHE IS TO AVOID NEEDLES LEFT ARM-DID HAVE AXILLARY LYMPH NODES REMOVED  . Chest pain    HX OF CHEST PAIN --NUCLEAR STRESS TEST 04/19/11 NORMAL--PT STATES SHE WAS TOLD STRESS MAY BE THE CAUSE OF HER CHEST PAINS  . Depression   . Diabetes mellitus without complication ([place])   . Family history of anesthesia complication    PT'S SON HAD ANESTHESIA PROBLEM WITH EXCISION OF WISDOM TEETH ---THRASING AROUND--BUT DID GO HOME SAME DAY OF PROCEDURE  . GERD (gastroesophageal reflux disease)   . Heart murmur    MVP - PT THINKS HER CHEST PAIN IS RELATED TO HER MVP  . Hemorrhoid   . Hyperlipemia   . Hypertension   . IBS (irritable bowel syndrome)   . IGT (impaired glucose tolerance)   . Insomnia   . Need for vaccination against Streptococcus pneumoniae 04/04/2013  . Sleep apnea    HAS CPAP MACHINE-BUT NO LONGER USES    Past Surgical History:  Procedure Laterality Date  . ABDOMINAL HYSTERECTOMY    . BREAST SURGERY     LEFT MASTECTOMY AND AXILLARY  DISSECTION FOR BREAST CANCER  . CHOLECYSTECTOMY    . MASTECTOMY    . TONSILLECTOMY     AS A TEENAGER  . TOTAL HIP ARTHROPLASTY Right 03/29/2012   Procedure: Right TOTAL HIP ARTHROPLASTY ANTERIOR APPROACH;  Surgeon: Mcarthur Rossetti, MD;  Location: [facility];  Service: Orthopedics;  Laterality: Right;  . TOTAL HIP ARTHROPLASTY Left 01/31/2013   Procedure: LEFT TOTAL HIP ARTHROPLASTY ANTERIOR APPROACH;  Surgeon: Mcarthur Rossetti, MD;  Location: [facility];  Service: Orthopedics;  Laterality: Left;    There were no vitals filed for this visit.      Subjective Assessment - 09/07/15 1510    Subjective Patient reports that she is a little upset emotionally.  She did bring in a TENS unit   Currently in Pain? Yes   Pain Score 4    Pain Location Back   Pain Orientation Lower                         [facility] Adult PT Treatment/Exercise - 09/07/15 0001      Self-Care   Self-Care Other Self-Care Comments   Other Self-Care Comments  Applied and went over TENS use, went over modes, batteries, electrodes, freq/duration, precautions and contraindications, had patient demonstrate prior to leaving,  she did this well and with minimal cues.  Then went over all exercises in gym, safetty, use of equipment, reps, sets and weights                  PT Short Term Goals - 08/16/15 1331      PT SHORT TERM GOAL #1   Title independent with initial HEP   Time 2   Period Weeks   Status met           PT Long Term Goals - 09/07/15 1614      PT LONG TERM GOAL #1   Title understand proper posture and body mechanics   Status Achieved               Plan - 09/07/15 1612    Clinical Impression Statement Patient had a good understanding of TENS use and safety when she left here, she was able to turn on and off and apply safely.  She has a concern about her copay and would like to try the exercise on her own'   PT Next Visit Plan may hold for a few weeks as she tries  exercise on own, dud to high copay   Consulted and Agree with Plan of Care Patient      Patient will benefit from skilled therapeutic intervention in order to improve the following deficits and impairments:  Decreased activity tolerance, Decreased range of motion, Decreased strength, Difficulty walking, Increased muscle spasms, Impaired flexibility, Postural dysfunction, Improper body mechanics, Pain  Visit Diagnosis: Midline low back pain without sciatica  Muscle spasm of back  Difficulty in walking, not elsewhere classified     Problem List Patient Active Problem List   Diagnosis Date Noted  . Need for vaccination against Streptococcus pneumoniae 04/04/2013  . Anxiety 02/14/2013  . Acute posthemorrhagic anemia 02/08/2013  . Arthritis pain of left hip 01/31/2013  . Depression 04/03/2012  . Insomnia 04/03/2012  . Generalized anxiety disorder 04/03/2012  . Anticoagulation adequate 04/03/2012  . Hypertension   . Sleep apnea   . Diabetes mellitus without complication ([place])   . GERD (gastroesophageal reflux disease)   . History of breast cancer   . 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  Patient goals were met. Patient is being discharged due to meeting the stated rehab goals.  ?????         Sumner Boast., PT 09/07/2015, 4:15 PM  [address] [place], [place], [address] Phone: [phone]   Fax:  [phone]  Name: Rebecca Lowery MRN: [id number] Date of Birth: April 23, 1940

## 2015-10-18 DIAGNOSIS — F331 Major depressive disorder, recurrent, moderate: Secondary | ICD-10-CM | POA: Diagnosis not present

## 2015-10-20 DIAGNOSIS — I10 Essential (primary) hypertension: Secondary | ICD-10-CM | POA: Diagnosis not present

## 2015-10-20 DIAGNOSIS — E784 Other hyperlipidemia: Secondary | ICD-10-CM | POA: Diagnosis not present

## 2015-10-20 DIAGNOSIS — E119 Type 2 diabetes mellitus without complications: Secondary | ICD-10-CM | POA: Diagnosis not present

## 2015-10-27 DIAGNOSIS — E119 Type 2 diabetes mellitus without complications: Secondary | ICD-10-CM | POA: Diagnosis not present

## 2015-10-27 DIAGNOSIS — N390 Urinary tract infection, site not specified: Secondary | ICD-10-CM | POA: Diagnosis not present

## 2015-10-27 DIAGNOSIS — E784 Other hyperlipidemia: Secondary | ICD-10-CM | POA: Diagnosis not present

## 2015-10-27 DIAGNOSIS — K589 Irritable bowel syndrome without diarrhea: Secondary | ICD-10-CM | POA: Diagnosis not present

## 2015-10-27 DIAGNOSIS — I10 Essential (primary) hypertension: Secondary | ICD-10-CM | POA: Diagnosis not present

## 2015-10-27 DIAGNOSIS — C50919 Malignant neoplasm of unspecified site of unspecified female breast: Secondary | ICD-10-CM | POA: Diagnosis not present

## 2015-10-27 DIAGNOSIS — Z0000 Encounter for general adult medical examination without abnormal findings: Secondary | ICD-10-CM | POA: Diagnosis not present

## 2015-10-27 DIAGNOSIS — F331 Major depressive disorder, recurrent, moderate: Secondary | ICD-10-CM | POA: Diagnosis not present

## 2015-10-27 DIAGNOSIS — J309 Allergic rhinitis, unspecified: Secondary | ICD-10-CM | POA: Diagnosis not present

## 2015-10-27 DIAGNOSIS — D72829 Elevated white blood cell count, unspecified: Secondary | ICD-10-CM | POA: Diagnosis not present

## 2015-10-29 DIAGNOSIS — F331 Major depressive disorder, recurrent, moderate: Secondary | ICD-10-CM | POA: Diagnosis not present

## 2015-11-04 DIAGNOSIS — Z1212 Encounter for screening for malignant neoplasm of rectum: Secondary | ICD-10-CM | POA: Diagnosis not present

## 2015-11-11 DIAGNOSIS — H25042 Posterior subcapsular polar age-related cataract, left eye: Secondary | ICD-10-CM | POA: Diagnosis not present

## 2015-11-11 DIAGNOSIS — H2012 Chronic iridocyclitis, left eye: Secondary | ICD-10-CM | POA: Diagnosis not present

## 2015-11-11 DIAGNOSIS — H25812 Combined forms of age-related cataract, left eye: Secondary | ICD-10-CM | POA: Diagnosis not present

## 2015-11-11 DIAGNOSIS — H25012 Cortical age-related cataract, left eye: Secondary | ICD-10-CM | POA: Diagnosis not present

## 2015-11-11 DIAGNOSIS — H2512 Age-related nuclear cataract, left eye: Secondary | ICD-10-CM | POA: Diagnosis not present

## 2015-11-23 DIAGNOSIS — K219 Gastro-esophageal reflux disease without esophagitis: Secondary | ICD-10-CM | POA: Diagnosis not present

## 2015-11-23 DIAGNOSIS — K5900 Constipation, unspecified: Secondary | ICD-10-CM | POA: Diagnosis not present

## 2015-11-23 DIAGNOSIS — R195 Other fecal abnormalities: Secondary | ICD-10-CM | POA: Diagnosis not present

## 2015-11-23 DIAGNOSIS — R140 Abdominal distension (gaseous): Secondary | ICD-10-CM | POA: Diagnosis not present

## 2015-12-22 DIAGNOSIS — H35372 Puckering of macula, left eye: Secondary | ICD-10-CM | POA: Diagnosis not present

## 2016-03-22 ENCOUNTER — Other Ambulatory Visit (HOSPITAL_COMMUNITY): Payer: Self-pay | Admitting: Psychiatry

## 2016-04-12 DIAGNOSIS — F331 Major depressive disorder, recurrent, moderate: Secondary | ICD-10-CM | POA: Diagnosis not present

## 2016-04-12 DIAGNOSIS — R69 Illness, unspecified: Secondary | ICD-10-CM | POA: Diagnosis not present

## 2016-04-17 DIAGNOSIS — F331 Major depressive disorder, recurrent, moderate: Secondary | ICD-10-CM | POA: Diagnosis not present

## 2016-04-26 DIAGNOSIS — I10 Essential (primary) hypertension: Secondary | ICD-10-CM | POA: Diagnosis not present

## 2016-04-26 DIAGNOSIS — E119 Type 2 diabetes mellitus without complications: Secondary | ICD-10-CM | POA: Diagnosis not present

## 2016-05-01 DIAGNOSIS — Z6832 Body mass index (BMI) 32.0-32.9, adult: Secondary | ICD-10-CM | POA: Diagnosis not present

## 2016-05-01 DIAGNOSIS — F331 Major depressive disorder, recurrent, moderate: Secondary | ICD-10-CM | POA: Diagnosis not present

## 2016-05-01 DIAGNOSIS — I10 Essential (primary) hypertension: Secondary | ICD-10-CM | POA: Diagnosis not present

## 2016-05-01 DIAGNOSIS — E119 Type 2 diabetes mellitus without complications: Secondary | ICD-10-CM | POA: Diagnosis not present

## 2016-05-01 DIAGNOSIS — M1990 Unspecified osteoarthritis, unspecified site: Secondary | ICD-10-CM | POA: Diagnosis not present

## 2016-05-01 DIAGNOSIS — D72829 Elevated white blood cell count, unspecified: Secondary | ICD-10-CM | POA: Diagnosis not present

## 2016-05-22 DIAGNOSIS — F331 Major depressive disorder, recurrent, moderate: Secondary | ICD-10-CM | POA: Diagnosis not present

## 2016-06-20 DIAGNOSIS — K921 Melena: Secondary | ICD-10-CM | POA: Diagnosis not present

## 2016-06-20 DIAGNOSIS — K219 Gastro-esophageal reflux disease without esophagitis: Secondary | ICD-10-CM | POA: Diagnosis not present

## 2016-07-04 DIAGNOSIS — H5211 Myopia, right eye: Secondary | ICD-10-CM | POA: Diagnosis not present

## 2016-07-04 DIAGNOSIS — E119 Type 2 diabetes mellitus without complications: Secondary | ICD-10-CM | POA: Diagnosis not present

## 2016-07-04 DIAGNOSIS — H43813 Vitreous degeneration, bilateral: Secondary | ICD-10-CM | POA: Diagnosis not present

## 2016-07-04 DIAGNOSIS — H2511 Age-related nuclear cataract, right eye: Secondary | ICD-10-CM | POA: Diagnosis not present

## 2016-07-10 DIAGNOSIS — F331 Major depressive disorder, recurrent, moderate: Secondary | ICD-10-CM | POA: Diagnosis not present

## 2016-07-19 DIAGNOSIS — K5900 Constipation, unspecified: Secondary | ICD-10-CM | POA: Diagnosis not present

## 2016-07-19 DIAGNOSIS — K648 Other hemorrhoids: Secondary | ICD-10-CM | POA: Diagnosis not present

## 2016-07-19 DIAGNOSIS — K5730 Diverticulosis of large intestine without perforation or abscess without bleeding: Secondary | ICD-10-CM | POA: Diagnosis not present

## 2016-07-19 DIAGNOSIS — K921 Melena: Secondary | ICD-10-CM | POA: Diagnosis not present

## 2016-07-24 DIAGNOSIS — F331 Major depressive disorder, recurrent, moderate: Secondary | ICD-10-CM | POA: Diagnosis not present

## 2016-08-02 ENCOUNTER — Telehealth (INDEPENDENT_AMBULATORY_CARE_PROVIDER_SITE_OTHER): Payer: Self-pay

## 2016-08-02 ENCOUNTER — Other Ambulatory Visit (INDEPENDENT_AMBULATORY_CARE_PROVIDER_SITE_OTHER): Payer: Self-pay

## 2016-08-02 DIAGNOSIS — M4150 Other secondary scoliosis, site unspecified: Principal | ICD-10-CM

## 2016-08-02 NOTE — Telephone Encounter (Signed)
Sent order to [facility], patient aware

## 2016-08-02 NOTE — Telephone Encounter (Signed)
See if Rebecca Lowery will go ahead and see her and do the injection

## 2016-08-02 NOTE — Telephone Encounter (Signed)
Please advise, looks like the last injection she had with Ernestina Patches was in 2015. We saw her last for her back pain 06/2015 She need an appointment before order?

## 2016-08-02 NOTE — Telephone Encounter (Signed)
Patient would like to know if she can get a cort. Injection in her back?  CB# is [phone].  Please advise.  Thank You.

## 2016-08-22 ENCOUNTER — Ambulatory Visit (INDEPENDENT_AMBULATORY_CARE_PROVIDER_SITE_OTHER): Payer: Medicare HMO | Admitting: Physical Medicine and Rehabilitation

## 2016-08-22 ENCOUNTER — Encounter (INDEPENDENT_AMBULATORY_CARE_PROVIDER_SITE_OTHER): Payer: Self-pay | Admitting: Physical Medicine and Rehabilitation

## 2016-08-22 ENCOUNTER — Telehealth (INDEPENDENT_AMBULATORY_CARE_PROVIDER_SITE_OTHER): Payer: Self-pay

## 2016-08-22 VITALS — BP 151/77 | HR 87

## 2016-08-22 DIAGNOSIS — M47816 Spondylosis without myelopathy or radiculopathy, lumbar region: Secondary | ICD-10-CM | POA: Diagnosis not present

## 2016-08-22 DIAGNOSIS — G8929 Other chronic pain: Secondary | ICD-10-CM

## 2016-08-22 DIAGNOSIS — M545 Low back pain: Secondary | ICD-10-CM | POA: Diagnosis not present

## 2016-08-22 NOTE — Progress Notes (Signed)
Rebecca Lowery - 76 y.o. female MRN [id number]  Date of birth: 12-20-1940  Office Visit Note: Visit Date: 08/22/2016 PCP: Prince Solian, MD Referred by: Prince Solian, MD  Subjective: Chief Complaint  Patient presents with  . Lower Back - Pain   HPI: Rebecca Lowery is a pleasant 76 year old female that I last saw on 2015 and actually completed cervical facet joint blocks at that time with good relief of her symptoms. Prior to that we did complete some epidural injections for predominantly back pain and some hip pain that had failed conservative care. Since I've seen her she has had both hips replaced by Dr. Ninfa Linden he is actually seen her as early as last year for evaluation of her back pain. She comes in today for worsening off and on chronic back pain. She reports seeing Dr. Ninfa Linden and he gave her a prednisone dosepak as well as physical therapy and other medication managements which she feels didn't help really much at all but the symptoms seem to wax and wane in general. She reports since July 7 of this year she has had persistent axial midline back pain after a bus trip to the beach. She spent a lot of time sitting on the bus as well as being very active at the beach which she is normally not as active. She feels like she is aggravated her back pain. She reports it mostly in the middle of the lower back about the L4-5 level. She denies any leg pain or paresthesias. She reports this as a constant ache. She is worse first thing in the morning and then when she bends and returns to standing. She has not noted any bowel or bladder changes. She's had no specific trauma. She has tried regrouping with anti-inflammatory medications without relief. She has not had any new advanced imaging. MRI from 2010 is reviewed again below which showed mainly mild scoliosis as well as multilevel facet changes which were actually fairly mild for her age. There was no focal stenosis. Lumbar spine x-rays were  completed by Dr. Ninfa Linden last year but really did not show anything new or revealing.    Review of Systems  Constitutional: Negative for chills, fever, malaise/fatigue and weight loss.  HENT: Negative for hearing loss and sinus pain.   Eyes: Negative for blurred vision, double vision and photophobia.  Respiratory: Negative for cough and shortness of breath.   Cardiovascular: Negative for chest pain, palpitations and leg swelling.  Gastrointestinal: Negative for abdominal pain, nausea and vomiting.  Genitourinary: Negative for flank pain.  Musculoskeletal: Positive for back pain. Negative for myalgias.  Skin: Negative for itching and rash.  Neurological: Negative for tremors, focal weakness and weakness.  Endo/Heme/Allergies: Negative.   Psychiatric/Behavioral: Negative for depression.  All other systems reviewed and are negative.  Otherwise per HPI.  Assessment & Plan: Visit Diagnoses:  1. Chronic bilateral low back pain without sciatica   2. Spondylosis without myelopathy or radiculopathy, lumbar region     Plan: Findings:  Chronic history of low back pain off and on for many years. Last MRI was in 2010 which did show degenerative changes particularly upper lumbar region and then milder disc degeneration as well as spurring and facet arthropathy at L4-5. At the time she did well with epidural injection for more radicular-type complaint. She was also having a lot of hip issues at the time and has gone to get both hips replaced. She has in general multiple areas of musculoskeletal pain. She's had severe  pain now for 6 weeks which is constant and unremitting despite regrouping with medication as well as regrouping with exercises from therapy. She reports going to physical therapy did not help but we tried to counsel on the fact that a lot of times we sent focus of therapy just to get an idea of how they can help themselves at home and we talked about core strengthening and exercises which she  has been trying to stay active. I do think she would benefit from diagnostic and hopefully therapeutic facet joint blocks at L4-5. Depending on how well she did that with that we could focus on therapeutic exercise and possibly down the road look at radiofrequency ablation. I would not change any medications at this point.    Meds & Orders: No orders of the defined types were placed in this encounter.  No orders of the defined types were placed in this encounter.   Follow-up: Return for Bilateral L4-5 facet joint blocks. We will get preauthorization.   Procedures: No procedures performed  No notes on file   Clinical History: MRI LUMBAR SPINE WITHOUT CONTRAST 02/17/08   Technique: Multiplanar and multiecho pulse sequences of the lumbar spine were obtained without intravenous contrast.   Comparison: 10/24/2006   Findings: The scan extends from T10-11 through the second sacral segment. The tip of the conus is at L1 and appears normal. There is a scoliosis centered at L2-3 with convexity to the right. The individual disc spaces are examined as follows:   T10-11 through T12-L1: No significant abnormalities.   L1-2: There is degenerative disc disease with some asymmetric spurring to the left of midline without significant impingement upon the thecal sac or nerve roots.   L2-3: There is degenerative disc disease with disc space narrowing and asymmetric spurring to the left of midline. There is no disc herniation or significant to nerve root impingement.   L3-4: No significant abnormality.   L4-5: There is mild degeneration of the disc with an asymmetric spurring to the right without impingement.   L5-S1: There is disc space narrowing with posterior spurring into the right neural foramen without impingement.   There is no spinal stenosis. The paraspinal soft tissues demonstrate no significant abnormality. There are only mild degenerative changes of the facet joints in the lower  lumbar spine.   IMPRESSION: Scoliosis with mild diffuse degenerative disc disease. There is no discrete spinal stenosis, disc herniation, or focal nerve root impingement. There has been no significant change since the prior exam.    EXAM: PORTABLE PELVIS 1-2 VIEWS  COMPARISON: 03/29/2012  FINDINGS: Exam demonstrates no change in a right total hip arthroplasty. There has been placement of a left total hip arthroplasty with prosthesis intact and normally located. Remainder of the exam is unchanged.  IMPRESSION: Left total hip arthroplasty intact and normally located.  Stable right total hip arthroplasty.   Electronically Signed By: Marin Olp M.D. On: 01/31/2013 12:49  She reports that she has never smoked. She has never used smokeless tobacco. No results for input(s): HGBA1C, LABURIC in the last 8760 hours.  Objective:  VS:  HT:    WT:   BMI:     BP:(!) 151/77  HR:87bpm  TEMP: ( )  RESP:  Physical Exam  Constitutional: She is oriented to person, place, and time. She appears well-developed and well-nourished.  Eyes: Pupils are equal, round, and reactive to light. Conjunctivae and EOM are normal.  Cardiovascular: Normal rate and intact distal pulses.   Pulmonary/Chest: Effort normal.  Musculoskeletal:  Patient ambulates without aid. She is slow to rise from a seated position. She has concordant significant low back pain with extension of the lumbar spine. She has no pain over the greater trochanters. She has no pain with hip rotation. She has good distal strength.   Neurological: She is alert and oriented to person, place, and time. She exhibits normal muscle tone.  Skin: Skin is warm and dry. No rash noted. No erythema.  Psychiatric: She has a normal mood and affect. Her behavior is normal.  Nursing note and vitals reviewed.   Ortho Exam Imaging: No results found.  Past Medical/Family/Surgical/Social History: Medications & Allergies reviewed per  EMR Patient Active Problem List   Diagnosis Date Noted  . Need for vaccination against Streptococcus pneumoniae 04/04/2013  . Anxiety 02/14/2013  . Acute posthemorrhagic anemia 02/08/2013  . Arthritis pain of left hip 01/31/2013  . Depression 04/03/2012  . Insomnia 04/03/2012  . Generalized anxiety disorder 04/03/2012  . Anticoagulation adequate 04/03/2012  . Hypertension   . Sleep apnea   . Diabetes mellitus without complication ([place])   . GERD (gastroesophageal reflux disease)   . History of breast cancer   . 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 Anginal pain ([place])    09/2012, checked out and was told it was stress  . Anxiety   . Arthritis    OA BOTH HIPS, KNEES AND BACK.  SEVER PAIN IN RIGHT HIP  . Breast cancer ([place])    left mastectomy detected on mammogram  . Cancer ([place]) 1991   LEFT MASTECTOMY FOR BREAST CANCER--PT NOT SURE IF SHE IS TO AVOID NEEDLES LEFT ARM-DID HAVE AXILLARY LYMPH NODES REMOVED  . Chest pain    HX OF CHEST PAIN --NUCLEAR STRESS TEST 04/19/11 NORMAL--PT STATES SHE WAS TOLD STRESS MAY BE THE CAUSE OF HER CHEST PAINS  . Depression   . Diabetes mellitus without complication ([place])   . Family history of anesthesia complication    PT'S SON HAD ANESTHESIA PROBLEM WITH EXCISION OF WISDOM TEETH ---THRASING AROUND--BUT DID GO HOME SAME DAY OF PROCEDURE  . GERD (gastroesophageal reflux disease)   . Heart murmur    MVP - PT THINKS HER CHEST PAIN IS RELATED TO HER MVP  . Hemorrhoid   . Hyperlipemia   . Hypertension   . IBS (irritable bowel syndrome)   . IGT (impaired glucose tolerance)   . Insomnia   . Need for vaccination against Streptococcus pneumoniae 04/04/2013  . Sleep apnea    HAS CPAP MACHINE-BUT NO LONGER USES   Family History  Problem Relation Age of Onset  . Cancer Mother        breast ca  . Breast cancer Mother   . Kidney disease Father   . Diabetes Father    Past Surgical History:   Procedure Laterality Date  . ABDOMINAL HYSTERECTOMY    . BREAST SURGERY     LEFT MASTECTOMY AND AXILLARY DISSECTION FOR BREAST CANCER  . CHOLECYSTECTOMY    . MASTECTOMY    . TONSILLECTOMY     AS A TEENAGER  . TOTAL HIP ARTHROPLASTY Right 03/29/2012   Procedure: Right TOTAL HIP ARTHROPLASTY ANTERIOR APPROACH;  Surgeon: Mcarthur Rossetti, MD;  Location: [facility];  Service: Orthopedics;  Laterality: Right;  . TOTAL HIP ARTHROPLASTY Left 01/31/2013   Procedure: LEFT TOTAL HIP ARTHROPLASTY ANTERIOR APPROACH;  Surgeon: Mcarthur Rossetti, MD;  Location: [facility];  Service: Orthopedics;  Laterality: Left;   Social History   Occupational History  . Not on file.   Social History Main Topics  . Smoking status: Never Smoker  . Smokeless tobacco: Never Used  . Alcohol use Yes     Comment: rare glass of wine  . Drug use: No  . Sexual activity: Not on file

## 2016-08-22 NOTE — Telephone Encounter (Signed)
Needs [employer] precert for Bil H8-3 facet precert

## 2016-08-22 NOTE — Progress Notes (Deleted)
On and off lower back pain for several years. Persistent pain since 07/29/16. Went on a bus trip to [place] and had to spend a lot of time sitting and then was very active while she was there and thinks this aggravated her back. Says pain is mostly in the middle of her lower back .Denies any leg pain. Constant ache. Worse first thing in the morning and when she bends.

## 2016-08-23 ENCOUNTER — Encounter (INDEPENDENT_AMBULATORY_CARE_PROVIDER_SITE_OTHER): Payer: Self-pay | Admitting: Physical Medicine and Rehabilitation

## 2016-09-07 ENCOUNTER — Ambulatory Visit (INDEPENDENT_AMBULATORY_CARE_PROVIDER_SITE_OTHER): Payer: Medicare HMO

## 2016-09-07 ENCOUNTER — Ambulatory Visit (INDEPENDENT_AMBULATORY_CARE_PROVIDER_SITE_OTHER): Payer: Medicare HMO | Admitting: Physical Medicine and Rehabilitation

## 2016-09-07 ENCOUNTER — Encounter (INDEPENDENT_AMBULATORY_CARE_PROVIDER_SITE_OTHER): Payer: Self-pay | Admitting: Physical Medicine and Rehabilitation

## 2016-09-07 VITALS — BP 157/80 | HR 93

## 2016-09-07 DIAGNOSIS — G8929 Other chronic pain: Secondary | ICD-10-CM

## 2016-09-07 DIAGNOSIS — M545 Low back pain, unspecified: Secondary | ICD-10-CM

## 2016-09-07 DIAGNOSIS — M47816 Spondylosis without myelopathy or radiculopathy, lumbar region: Secondary | ICD-10-CM

## 2016-09-07 MED ORDER — LIDOCAINE HCL 1 % IJ SOLN
2.0000 mL | Freq: Once | INTRAMUSCULAR | Status: AC
  Administered 2016-09-07: 2 mL

## 2016-09-07 MED ORDER — METHYLPREDNISOLONE ACETATE 80 MG/ML IJ SUSP
80.0000 mg | Freq: Once | INTRAMUSCULAR | Status: AC
  Administered 2016-09-07: 80 mg

## 2016-09-07 NOTE — Patient Instructions (Signed)

## 2016-09-07 NOTE — Progress Notes (Deleted)
Patient is here today for planned bilateral L4-5 facet injection. No change in symptoms.

## 2016-09-07 NOTE — Progress Notes (Unsigned)
Fluoro time: 40 sec Mgy:48.55

## 2016-09-08 NOTE — Procedures (Signed)
Rebecca Lowery is a 76 year old female that we recently saw. She is here for planned bilateral L4-5 facet joint blocks for her axial low back pain. Please see our prior evaluation and management note for further details and justification.  Lumbar Facet Joint Intra-Articular Injection(s) with Fluoroscopic Guidance  Patient: Rebecca Lowery      Date of Birth: 1940/07/02 MRN: [id number] PCP: Prince Solian, MD      Visit Date: 09/07/2016   Universal Protocol:    Date/Time: 09/07/2016  Consent Given By: the patient  Position: PRONE   Additional Comments: Vital signs were monitored before and after the procedure. Patient was prepped and draped in the usual sterile fashion. The correct patient, procedure, and site was verified.   Injection Procedure Details:  Procedure Site One Meds Administered:  Meds ordered this encounter  Medications  . lidocaine (XYLOCAINE) 1 % (with pres) injection 2 mL  . methylPREDNISolone acetate (DEPO-MEDROL) injection 80 mg     Laterality: Bilateral  Location/Site:  L4-L5  Needle size: 22 guage  Needle type: Spinal  Needle Placement: Articular  Findings:  -Contrast Used: 1 mL iohexol 180 mg iodine/mL   -Comments: Excellent flow of contrast producing a partial arthrogram.  Procedure Details: The fluoroscope beam is vertically oriented in AP, and the inferior recess is visualized beneath the lower pole of the inferior apophyseal process, which represents the target point for needle insertion. When direct visualization is difficult the target point is located at the medial projection of the vertebral pedicle. The region overlying each aforementioned target is locally anesthetized with a 1 to 2 ml. volume of 1% Lidocaine without Epinephrine.   The spinal needle was inserted into each of the above mentioned facet joints using biplanar fluoroscopic guidance. A 0.25 to 0.5 ml. volume of Isovue-250 was injected and a partial facet joint arthrogram was  obtained. A single spot film was obtained of the resulting arthrogram.    One to 1.25 ml of the steroid/anesthetic solution was then injected into each of the facet joints noted above.   Additional Comments:  The patient tolerated the procedure well Dressing: Band-Aid    Post-procedure details: Patient was observed during the procedure. Post-procedure instructions were reviewed.  Patient left the clinic in stable condition.

## 2016-09-12 ENCOUNTER — Other Ambulatory Visit (HOSPITAL_COMMUNITY): Payer: Self-pay | Admitting: Psychiatry

## 2016-09-12 DIAGNOSIS — F331 Major depressive disorder, recurrent, moderate: Secondary | ICD-10-CM | POA: Diagnosis not present

## 2016-09-19 ENCOUNTER — Telehealth (INDEPENDENT_AMBULATORY_CARE_PROVIDER_SITE_OTHER): Payer: Self-pay | Admitting: Physical Medicine and Rehabilitation

## 2016-09-19 ENCOUNTER — Other Ambulatory Visit (INDEPENDENT_AMBULATORY_CARE_PROVIDER_SITE_OTHER): Payer: Self-pay | Admitting: Physical Medicine and Rehabilitation

## 2016-09-19 DIAGNOSIS — M47816 Spondylosis without myelopathy or radiculopathy, lumbar region: Secondary | ICD-10-CM

## 2016-09-19 MED ORDER — DICLOFENAC SODIUM 75 MG PO TBEC: 75.0000 mg | DELAYED_RELEASE_TABLET | Freq: Two times a day (BID) | ORAL | 0 refills | Status: DC

## 2016-09-19 NOTE — Telephone Encounter (Signed)
Patient called and with increasing low back pain and continued low back pain chronic worsening after injection which was facet joint block. Prescribe diclofenac 75 mg twice a day and will update MRI of the lumbar spine as per our last note.

## 2016-09-19 NOTE — Telephone Encounter (Signed)
Like I said she experienced pain with injection to the arthritic joint which is common but is have a radicular type pain so the need for MRI

## 2016-09-19 NOTE — Telephone Encounter (Signed)
Please tell her that we called in diclofenac which is an anti-inflammatory medicine stronger than ibuprofen just for a little while. She should take this as directed on the bottle. She should not take ibuprofen but can continue taking the Tylenol. I do want to get an updated MRI of her L-spine if you could order that.

## 2016-09-19 NOTE — Telephone Encounter (Signed)
MRI ordered, ready for cosign. I called patient to discuss, and she said she is very upset and wants to cry because she is having a tingling in her big toe that she did not have before. I tried to reassure her that her injection was done in a joint, but she said she had the same thing during the injection. She said she is probably just upset because she is hurting and that she will try the medication and await scheduling for MRI. Please advise if I need to do anything else.

## 2016-10-11 ENCOUNTER — Telehealth (INDEPENDENT_AMBULATORY_CARE_PROVIDER_SITE_OTHER): Payer: Self-pay | Admitting: Physical Medicine and Rehabilitation

## 2016-10-11 NOTE — Telephone Encounter (Signed)
-----   Message from Magnus Sinning, MD sent at 10/11/2016  4:21 PM EDT ----- Regarding: RE: MRI Review Ok, would have been nice to see updated MRI to explain toe numbness. ----- Message ----- From: Sherre Scarlet, RT Sent: 10/11/2016   4:16 PM To: Magnus Sinning, MD Subject: MRI Review                                     FYI- Patient was scheduled for an MRI and an MRI review next week. She left a message asking [place] to cancel her appointment and saying that she is going to cancel her MRI as well. She said the diclofenac is helping. She also said that she is discouraged because the toes on her left foot have been tingling since her injection.

## 2016-10-13 DIAGNOSIS — F331 Major depressive disorder, recurrent, moderate: Secondary | ICD-10-CM | POA: Diagnosis not present

## 2016-10-16 DIAGNOSIS — F331 Major depressive disorder, recurrent, moderate: Secondary | ICD-10-CM | POA: Diagnosis not present

## 2016-10-18 ENCOUNTER — Other Ambulatory Visit: Payer: Self-pay

## 2016-10-19 ENCOUNTER — Ambulatory Visit (INDEPENDENT_AMBULATORY_CARE_PROVIDER_SITE_OTHER): Payer: Medicare HMO | Admitting: Physical Medicine and Rehabilitation

## 2016-10-20 DIAGNOSIS — Z6832 Body mass index (BMI) 32.0-32.9, adult: Secondary | ICD-10-CM | POA: Diagnosis not present

## 2016-10-20 DIAGNOSIS — R2689 Other abnormalities of gait and mobility: Secondary | ICD-10-CM | POA: Diagnosis not present

## 2016-10-20 DIAGNOSIS — M5416 Radiculopathy, lumbar region: Secondary | ICD-10-CM | POA: Diagnosis not present

## 2016-10-20 DIAGNOSIS — G4733 Obstructive sleep apnea (adult) (pediatric): Secondary | ICD-10-CM | POA: Diagnosis not present

## 2016-10-30 DIAGNOSIS — F331 Major depressive disorder, recurrent, moderate: Secondary | ICD-10-CM | POA: Diagnosis not present

## 2016-11-05 ENCOUNTER — Other Ambulatory Visit (INDEPENDENT_AMBULATORY_CARE_PROVIDER_SITE_OTHER): Payer: Self-pay | Admitting: Orthopaedic Surgery

## 2016-11-06 NOTE — Telephone Encounter (Signed)
Please advise 

## 2016-11-08 ENCOUNTER — Other Ambulatory Visit (INDEPENDENT_AMBULATORY_CARE_PROVIDER_SITE_OTHER): Payer: Self-pay

## 2016-11-08 MED ORDER — METHOCARBAMOL 500 MG PO TABS: 500.0000 mg | ORAL_TABLET | Freq: Four times a day (QID) | ORAL | 0 refills | Status: AC | PRN

## 2016-11-15 DIAGNOSIS — M6281 Muscle weakness (generalized): Secondary | ICD-10-CM | POA: Diagnosis not present

## 2016-11-15 DIAGNOSIS — R260 Ataxic gait: Secondary | ICD-10-CM | POA: Diagnosis not present

## 2016-11-15 DIAGNOSIS — M545 Low back pain: Secondary | ICD-10-CM | POA: Diagnosis not present

## 2016-11-17 DIAGNOSIS — M545 Low back pain: Secondary | ICD-10-CM | POA: Diagnosis not present

## 2016-11-17 DIAGNOSIS — R260 Ataxic gait: Secondary | ICD-10-CM | POA: Diagnosis not present

## 2016-11-17 DIAGNOSIS — M6281 Muscle weakness (generalized): Secondary | ICD-10-CM | POA: Diagnosis not present

## 2016-11-20 DIAGNOSIS — R260 Ataxic gait: Secondary | ICD-10-CM | POA: Diagnosis not present

## 2016-11-20 DIAGNOSIS — M6281 Muscle weakness (generalized): Secondary | ICD-10-CM | POA: Diagnosis not present

## 2016-11-20 DIAGNOSIS — M545 Low back pain: Secondary | ICD-10-CM | POA: Diagnosis not present

## 2016-11-24 DIAGNOSIS — F331 Major depressive disorder, recurrent, moderate: Secondary | ICD-10-CM | POA: Diagnosis not present

## 2016-11-27 DIAGNOSIS — M6281 Muscle weakness (generalized): Secondary | ICD-10-CM | POA: Diagnosis not present

## 2016-11-27 DIAGNOSIS — M545 Low back pain: Secondary | ICD-10-CM | POA: Diagnosis not present

## 2016-11-27 DIAGNOSIS — R260 Ataxic gait: Secondary | ICD-10-CM | POA: Diagnosis not present

## 2016-11-28 DIAGNOSIS — F331 Major depressive disorder, recurrent, moderate: Secondary | ICD-10-CM | POA: Diagnosis not present

## 2016-11-29 DIAGNOSIS — R260 Ataxic gait: Secondary | ICD-10-CM | POA: Diagnosis not present

## 2016-11-29 DIAGNOSIS — M545 Low back pain: Secondary | ICD-10-CM | POA: Diagnosis not present

## 2016-11-29 DIAGNOSIS — M6281 Muscle weakness (generalized): Secondary | ICD-10-CM | POA: Diagnosis not present

## 2016-11-30 DIAGNOSIS — R260 Ataxic gait: Secondary | ICD-10-CM | POA: Diagnosis not present

## 2016-11-30 DIAGNOSIS — M545 Low back pain: Secondary | ICD-10-CM | POA: Diagnosis not present

## 2016-11-30 DIAGNOSIS — M6281 Muscle weakness (generalized): Secondary | ICD-10-CM | POA: Diagnosis not present

## 2016-12-01 DIAGNOSIS — E119 Type 2 diabetes mellitus without complications: Secondary | ICD-10-CM | POA: Diagnosis not present

## 2016-12-01 DIAGNOSIS — E7849 Other hyperlipidemia: Secondary | ICD-10-CM | POA: Diagnosis not present

## 2016-12-01 DIAGNOSIS — R82998 Other abnormal findings in urine: Secondary | ICD-10-CM | POA: Diagnosis not present

## 2016-12-04 DIAGNOSIS — M6281 Muscle weakness (generalized): Secondary | ICD-10-CM | POA: Diagnosis not present

## 2016-12-04 DIAGNOSIS — R260 Ataxic gait: Secondary | ICD-10-CM | POA: Diagnosis not present

## 2016-12-04 DIAGNOSIS — M545 Low back pain: Secondary | ICD-10-CM | POA: Diagnosis not present

## 2016-12-06 ENCOUNTER — Institutional Professional Consult (permissible substitution): Payer: Medicare HMO | Admitting: Neurology

## 2016-12-07 DIAGNOSIS — F331 Major depressive disorder, recurrent, moderate: Secondary | ICD-10-CM | POA: Diagnosis not present

## 2016-12-07 DIAGNOSIS — N390 Urinary tract infection, site not specified: Secondary | ICD-10-CM | POA: Diagnosis not present

## 2016-12-07 DIAGNOSIS — Z23 Encounter for immunization: Secondary | ICD-10-CM | POA: Diagnosis not present

## 2016-12-07 DIAGNOSIS — M5416 Radiculopathy, lumbar region: Secondary | ICD-10-CM | POA: Diagnosis not present

## 2016-12-07 DIAGNOSIS — C9110 Chronic lymphocytic leukemia of B-cell type not having achieved remission: Secondary | ICD-10-CM | POA: Diagnosis not present

## 2016-12-07 DIAGNOSIS — C50919 Malignant neoplasm of unspecified site of unspecified female breast: Secondary | ICD-10-CM | POA: Diagnosis not present

## 2016-12-07 DIAGNOSIS — E7849 Other hyperlipidemia: Secondary | ICD-10-CM | POA: Diagnosis not present

## 2016-12-07 DIAGNOSIS — I10 Essential (primary) hypertension: Secondary | ICD-10-CM | POA: Diagnosis not present

## 2016-12-07 DIAGNOSIS — Z0000 Encounter for general adult medical examination without abnormal findings: Secondary | ICD-10-CM | POA: Diagnosis not present

## 2016-12-07 DIAGNOSIS — E119 Type 2 diabetes mellitus without complications: Secondary | ICD-10-CM | POA: Diagnosis not present

## 2016-12-11 DIAGNOSIS — M6281 Muscle weakness (generalized): Secondary | ICD-10-CM | POA: Diagnosis not present

## 2016-12-11 DIAGNOSIS — R260 Ataxic gait: Secondary | ICD-10-CM | POA: Diagnosis not present

## 2016-12-11 DIAGNOSIS — M545 Low back pain: Secondary | ICD-10-CM | POA: Diagnosis not present

## 2016-12-18 DIAGNOSIS — M545 Low back pain: Secondary | ICD-10-CM | POA: Diagnosis not present

## 2016-12-18 DIAGNOSIS — R260 Ataxic gait: Secondary | ICD-10-CM | POA: Diagnosis not present

## 2016-12-18 DIAGNOSIS — M6281 Muscle weakness (generalized): Secondary | ICD-10-CM | POA: Diagnosis not present

## 2016-12-19 ENCOUNTER — Telehealth: Payer: Self-pay | Admitting: Hematology & Oncology

## 2016-12-19 NOTE — Telephone Encounter (Signed)
lvm to inform of New Patient appt 01/08/17 at 130 pm

## 2016-12-20 ENCOUNTER — Telehealth: Payer: Self-pay | Admitting: Hematology and Oncology

## 2016-12-20 NOTE — Telephone Encounter (Signed)
Patient returned call to office to cxl New Patient appt.Patient stated she will return to oncologist she saw in the past

## 2016-12-20 NOTE — Telephone Encounter (Signed)
Scheduled appt per 11/28 sch msg - left message for patient regarding appt

## 2016-12-21 DIAGNOSIS — F331 Major depressive disorder, recurrent, moderate: Secondary | ICD-10-CM | POA: Diagnosis not present

## 2016-12-22 ENCOUNTER — Other Ambulatory Visit: Payer: Medicare HMO

## 2016-12-22 ENCOUNTER — Ambulatory Visit: Payer: Commercial Managed Care - HMO | Admitting: Hematology and Oncology

## 2016-12-22 ENCOUNTER — Telehealth: Payer: Self-pay

## 2016-12-22 ENCOUNTER — Other Ambulatory Visit: Payer: Self-pay | Admitting: Hematology and Oncology

## 2016-12-22 DIAGNOSIS — C9110 Chronic lymphocytic leukemia of B-cell type not having achieved remission: Secondary | ICD-10-CM | POA: Insufficient documentation

## 2016-12-22 NOTE — Telephone Encounter (Signed)
Called with below message. Ash her to call nurse back.

## 2016-12-22 NOTE — Telephone Encounter (Signed)
-----   Message from Heath Lark, MD sent at 12/22/2016 12:34 PM EST ----- Regarding: patient no show She was referred to me. I suggested referring to [place] since it is closer. She wants to see me so I worked her in. She did not show today. I am fully booked already for next month. Would she be willing to go to [place] to see Dr. Marin Olp?

## 2016-12-22 NOTE — Telephone Encounter (Signed)
Patient called and left message that she did not miss today's appt. She called and canceled 2 days ago. She still wants to see Dr. Alvy Lowery.  Called back and given appt for 12/12 at 0830 lab and 0900 see Dr. Alvy Lowery. Verbalized understanding. Scheduling message sent.

## 2016-12-25 DIAGNOSIS — R260 Ataxic gait: Secondary | ICD-10-CM | POA: Diagnosis not present

## 2016-12-25 DIAGNOSIS — M545 Low back pain: Secondary | ICD-10-CM | POA: Diagnosis not present

## 2016-12-25 DIAGNOSIS — M6281 Muscle weakness (generalized): Secondary | ICD-10-CM | POA: Diagnosis not present

## 2017-01-02 ENCOUNTER — Telehealth: Payer: Self-pay | Admitting: *Deleted

## 2017-01-02 ENCOUNTER — Telehealth: Payer: Self-pay | Admitting: Hematology and Oncology

## 2017-01-02 DIAGNOSIS — R260 Ataxic gait: Secondary | ICD-10-CM | POA: Diagnosis not present

## 2017-01-02 DIAGNOSIS — M545 Low back pain: Secondary | ICD-10-CM | POA: Diagnosis not present

## 2017-01-02 DIAGNOSIS — M6281 Muscle weakness (generalized): Secondary | ICD-10-CM | POA: Diagnosis not present

## 2017-01-02 NOTE — Telephone Encounter (Signed)
Pt states she needs to be rescheduled to the end of January- Tuesdays are best.   Message to scheduler

## 2017-01-02 NOTE — Telephone Encounter (Signed)
Left message regarding appointment/ schedule mailed Per sched message 12/11

## 2017-01-03 ENCOUNTER — Other Ambulatory Visit: Payer: Medicare HMO

## 2017-01-03 ENCOUNTER — Ambulatory Visit: Payer: Medicare HMO | Admitting: Hematology and Oncology

## 2017-01-08 ENCOUNTER — Ambulatory Visit: Payer: Self-pay | Admitting: Hematology & Oncology

## 2017-01-08 ENCOUNTER — Other Ambulatory Visit: Payer: Self-pay

## 2017-01-18 ENCOUNTER — Institutional Professional Consult (permissible substitution): Payer: Medicare HMO | Admitting: Neurology

## 2017-02-01 DIAGNOSIS — F331 Major depressive disorder, recurrent, moderate: Secondary | ICD-10-CM | POA: Diagnosis not present

## 2017-02-03 ENCOUNTER — Other Ambulatory Visit (INDEPENDENT_AMBULATORY_CARE_PROVIDER_SITE_OTHER): Payer: Self-pay | Admitting: Physical Medicine and Rehabilitation

## 2017-02-05 DIAGNOSIS — Z6832 Body mass index (BMI) 32.0-32.9, adult: Secondary | ICD-10-CM | POA: Diagnosis not present

## 2017-02-05 DIAGNOSIS — D72829 Elevated white blood cell count, unspecified: Secondary | ICD-10-CM | POA: Diagnosis not present

## 2017-02-05 DIAGNOSIS — G4733 Obstructive sleep apnea (adult) (pediatric): Secondary | ICD-10-CM | POA: Diagnosis not present

## 2017-02-05 DIAGNOSIS — L988 Other specified disorders of the skin and subcutaneous tissue: Secondary | ICD-10-CM | POA: Diagnosis not present

## 2017-02-05 DIAGNOSIS — C9110 Chronic lymphocytic leukemia of B-cell type not having achieved remission: Secondary | ICD-10-CM | POA: Diagnosis not present

## 2017-02-05 DIAGNOSIS — I10 Essential (primary) hypertension: Secondary | ICD-10-CM | POA: Diagnosis not present

## 2017-02-05 NOTE — Telephone Encounter (Signed)
Please advise 

## 2017-02-12 ENCOUNTER — Inpatient Hospital Stay: Payer: Medicare HMO

## 2017-02-12 ENCOUNTER — Telehealth: Payer: Self-pay | Admitting: Hematology and Oncology

## 2017-02-12 ENCOUNTER — Inpatient Hospital Stay: Payer: Medicare HMO | Attending: Hematology and Oncology | Admitting: Hematology and Oncology

## 2017-02-12 ENCOUNTER — Encounter: Payer: Self-pay | Admitting: Hematology and Oncology

## 2017-02-12 VITALS — BP 171/73 | HR 102 | Temp 97.9°F | Resp 18 | Ht 62.0 in | Wt 178.5 lb

## 2017-02-12 DIAGNOSIS — K219 Gastro-esophageal reflux disease without esophagitis: Secondary | ICD-10-CM | POA: Diagnosis not present

## 2017-02-12 DIAGNOSIS — E785 Hyperlipidemia, unspecified: Secondary | ICD-10-CM | POA: Diagnosis not present

## 2017-02-12 DIAGNOSIS — D72829 Elevated white blood cell count, unspecified: Secondary | ICD-10-CM | POA: Diagnosis not present

## 2017-02-12 DIAGNOSIS — Z90710 Acquired absence of both cervix and uterus: Secondary | ICD-10-CM | POA: Insufficient documentation

## 2017-02-12 DIAGNOSIS — K449 Diaphragmatic hernia without obstruction or gangrene: Secondary | ICD-10-CM | POA: Diagnosis not present

## 2017-02-12 DIAGNOSIS — I10 Essential (primary) hypertension: Secondary | ICD-10-CM | POA: Diagnosis not present

## 2017-02-12 DIAGNOSIS — Z9012 Acquired absence of left breast and nipple: Secondary | ICD-10-CM | POA: Diagnosis not present

## 2017-02-12 DIAGNOSIS — M1990 Unspecified osteoarthritis, unspecified site: Secondary | ICD-10-CM | POA: Diagnosis not present

## 2017-02-12 DIAGNOSIS — C9110 Chronic lymphocytic leukemia of B-cell type not having achieved remission: Secondary | ICD-10-CM

## 2017-02-12 DIAGNOSIS — C9190 Lymphoid leukemia, unspecified not having achieved remission: Secondary | ICD-10-CM | POA: Diagnosis not present

## 2017-02-12 DIAGNOSIS — I700 Atherosclerosis of aorta: Secondary | ICD-10-CM | POA: Insufficient documentation

## 2017-02-12 DIAGNOSIS — Z853 Personal history of malignant neoplasm of breast: Secondary | ICD-10-CM

## 2017-02-12 DIAGNOSIS — Z7982 Long term (current) use of aspirin: Secondary | ICD-10-CM | POA: Diagnosis not present

## 2017-02-12 DIAGNOSIS — K5730 Diverticulosis of large intestine without perforation or abscess without bleeding: Secondary | ICD-10-CM | POA: Diagnosis not present

## 2017-02-12 DIAGNOSIS — K7689 Other specified diseases of liver: Secondary | ICD-10-CM | POA: Diagnosis not present

## 2017-02-12 DIAGNOSIS — E119 Type 2 diabetes mellitus without complications: Secondary | ICD-10-CM | POA: Diagnosis not present

## 2017-02-12 DIAGNOSIS — K589 Irritable bowel syndrome without diarrhea: Secondary | ICD-10-CM | POA: Insufficient documentation

## 2017-02-12 DIAGNOSIS — M419 Scoliosis, unspecified: Secondary | ICD-10-CM | POA: Diagnosis not present

## 2017-02-12 DIAGNOSIS — N2889 Other specified disorders of kidney and ureter: Secondary | ICD-10-CM | POA: Diagnosis not present

## 2017-02-12 DIAGNOSIS — Z96641 Presence of right artificial hip joint: Secondary | ICD-10-CM | POA: Insufficient documentation

## 2017-02-12 DIAGNOSIS — M5136 Other intervertebral disc degeneration, lumbar region: Secondary | ICD-10-CM | POA: Diagnosis not present

## 2017-02-12 DIAGNOSIS — Z79899 Other long term (current) drug therapy: Secondary | ICD-10-CM | POA: Insufficient documentation

## 2017-02-12 DIAGNOSIS — F419 Anxiety disorder, unspecified: Secondary | ICD-10-CM | POA: Insufficient documentation

## 2017-02-12 DIAGNOSIS — L821 Other seborrheic keratosis: Secondary | ICD-10-CM

## 2017-02-12 LAB — CBC WITH DIFFERENTIAL/PLATELET
BASOS PCT: 1 %
Basophils Absolute: 0.4 10*3/uL — ABNORMAL HIGH (ref 0.0–0.1)
EOS ABS: 0.4 10*3/uL (ref 0.0–0.5)
Eosinophils Relative: 1 %
HCT: 41.2 % (ref 34.8–46.6)
HEMOGLOBIN: 13.2 g/dL (ref 11.6–15.9)
Lymphocytes Relative: 90 %
Lymphs Abs: 55.3 10*3/uL — ABNORMAL HIGH (ref 0.9–3.3)
MCH: 30.2 pg (ref 25.1–34.0)
MCHC: 32.1 g/dL (ref 31.5–36.0)
MCV: 94 fL (ref 79.5–101.0)
MONO ABS: 0.2 10*3/uL (ref 0.1–0.9)
Monocytes Relative: 0 %
NEUTROS ABS: 4.7 10*3/uL (ref 1.5–6.5)
NEUTROS PCT: 8 %
Platelets: 210 10*3/uL (ref 145–400)
RBC: 4.38 MIL/uL (ref 3.70–5.45)
RDW: 14.7 % (ref 11.2–16.1)
WBC: 61.1 10*3/uL — AB (ref 3.9–10.3)

## 2017-02-12 LAB — COMPREHENSIVE METABOLIC PANEL
ALT: 19 U/L (ref 0–55)
ANION GAP: 9 (ref 3–11)
AST: 16 U/L (ref 5–34)
Albumin: 3.9 g/dL (ref 3.5–5.0)
Alkaline Phosphatase: 103 U/L (ref 40–150)
BUN: 17 mg/dL (ref 7–26)
CO2: 27 mmol/L (ref 22–29)
Calcium: 9.8 mg/dL (ref 8.4–10.4)
Chloride: 105 mmol/L (ref 98–109)
Creatinine, Ser: 0.84 mg/dL (ref 0.60–1.10)
GFR calc Af Amer: >60 mL/min (ref >60)
Glucose, Bld: 102 mg/dL (ref 70–140)
POTASSIUM: 4.1 mmol/L (ref 3.3–4.7)
Sodium: 141 mmol/L (ref 136–145)
TOTAL PROTEIN: 6.7 g/dL (ref 6.4–8.3)
Total Bilirubin: 0.3 mg/dL (ref 0.2–1.2)

## 2017-02-12 LAB — LACTATE DEHYDROGENASE: LDH: 299 U/L — ABNORMAL HIGH (ref 125–245)

## 2017-02-12 MED ORDER — IBRUTINIB 420 MG PO TABS: 420.0000 mg | ORAL_TABLET | Freq: Every day | ORAL | 11 refills | Status: DC

## 2017-02-12 NOTE — Assessment & Plan Note (Signed)
Skin lesions resemble seborrheic keratosis I recommend dermatology review

## 2017-02-12 NOTE — Progress Notes (Signed)
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 female is here because of progressive leukocytosis. The last time she was seen here was in March 2015. The patient was lost to follow-up because she declined further workup in 2015. I have received multiple pages of outside records. Around April 2018, her total white count is around 26,000. By November 2018, absolute lymphocyte count has doubled to about 42,000 with a total white count around 50,000. She was recommended urgent evaluation.  Due to inclement weather and difficulties with transportation, she did not show up until today. She was offered an early appointment locally at [facility] but the patient declined offer. The leukocytosis was noted from routine blood work by her primary care doctor She denies anorexia, abnormal weight loss or night sweats She denies new lymphadenopathy She denies recent infection She is up-to-date with her age-appropriate screening modality. Repeatedly, she thought stress is the cause of her leukocytosis. She denies recent infection, fever or chills. She has occasional bug bites and is concerned about a spot on her lower leg that is not healing well. She complained of chronic fatigue  MEDICAL HISTORY:  Past Medical History:  Diagnosis Date  . Anginal pain ([place])    09/2012, checked out and was told it was stress  . Anxiety   . Arthritis    OA BOTH HIPS, KNEES AND BACK.  SEVER PAIN IN RIGHT HIP  . Breast cancer ([place])    left mastectomy detected on mammogram  . Cancer ([place]) 1991   LEFT MASTECTOMY FOR BREAST CANCER--PT NOT SURE IF SHE IS TO AVOID NEEDLES LEFT ARM-DID  HAVE AXILLARY LYMPH NODES REMOVED  . Chest pain    HX OF CHEST PAIN --NUCLEAR STRESS TEST 04/19/11 NORMAL--PT STATES SHE WAS TOLD STRESS MAY BE THE CAUSE OF HER CHEST PAINS  . Depression   . Diabetes mellitus without complication ([place])   . Family history of anesthesia complication    PT'S SON HAD ANESTHESIA PROBLEM WITH EXCISION OF WISDOM TEETH ---THRASING AROUND--BUT DID GO HOME SAME DAY OF PROCEDURE  . GERD (gastroesophageal reflux disease)   . Heart murmur    MVP - PT THINKS HER CHEST PAIN IS RELATED TO HER MVP  . Hemorrhoid   . Hyperlipemia   . Hypertension   . IBS (irritable bowel syndrome)   . IGT (impaired glucose tolerance)   . Insomnia   . Need for vaccination against Streptococcus pneumoniae 04/04/2013  . Sleep apnea    HAS CPAP MACHINE-BUT NO LONGER USES    SURGICAL HISTORY: Past Surgical History:  Procedure Laterality Date  . ABDOMINAL HYSTERECTOMY    . BREAST SURGERY     LEFT MASTECTOMY AND AXILLARY DISSECTION FOR BREAST CANCER  . CHOLECYSTECTOMY    . MASTECTOMY    . TONSILLECTOMY     AS A TEENAGER  . TOTAL HIP ARTHROPLASTY Right 03/29/2012   Procedure: Right TOTAL HIP ARTHROPLASTY ANTERIOR APPROACH;  Surgeon: Mcarthur Rossetti, MD;  Location: [facility];  Service: Orthopedics;  Laterality: Right;  . TOTAL HIP ARTHROPLASTY Left 01/31/2013   Procedure: LEFT TOTAL HIP ARTHROPLASTY ANTERIOR APPROACH;  Surgeon: Mcarthur Rossetti, MD;  Location: [facility];  Service: Orthopedics;  Laterality: Left;  SOCIAL HISTORY: Social History   Socioeconomic History  . Marital status: Widowed    Spouse name: Rebecca Lowery  . Number of children: Not on file  . Years of education: Not on file  . Highest education level: Not on file  Social Needs  . Financial resource strain: Not on file  . Food insecurity - worry: Not on file  . Food insecurity - inability: Not on file  . Transportation needs - medical: Not on file  . Transportation needs - non-medical: Not on file  Occupational  History  . Not on file  Tobacco Use  . Smoking status: Never Smoker  . Smokeless tobacco: Never Used  Substance and Sexual Activity  . Alcohol use: Yes    Comment: rare glass of wine  . Drug use: No  . Sexual activity: Not on file  Other Topics Concern  . Not on file  Social History Narrative   Lives with her husband, Rebecca Lowery, in [place]   He is being treated for multiple myeloma    FAMILY HISTORY: Family History  Problem Relation Age of Onset  . Cancer Mother        breast ca  . Breast cancer Mother   . Kidney disease Father   . Diabetes Father     ALLERGIES:  is allergic to ciprofloxacin and quinolones.  MEDICATIONS:  Current Outpatient Medications  Medication Sig Dispense Refill  . buPROPion (WELLBUTRIN XL) 150 MG 24 hr tablet Take 150 mg by mouth daily.    . diclofenac (VOLTAREN) 75 MG EC tablet TAKE 1 TABLET (75 MG TOTAL) BY MOUTH 2 (TWO) TIMES DAILY. TAKE WITH FOOD 60 tablet 0  . Ibrutinib 420 MG TABS Take 420 mg by mouth daily. 90 tablet 11  . LORazepam (ATIVAN) 1 MG tablet Take 1 mg by mouth every 8 (eight) hours.    Marland Kitchen losartan (COZAAR) 25 MG tablet Take 25 mg by mouth daily.    . methocarbamol (ROBAXIN) 500 MG tablet Take 1 tablet (500 mg total) by mouth every 6 (six) hours as needed for muscle spasms. 60 tablet 0  . omeprazole (PRILOSEC OTC) 20 MG tablet Take 20 mg by mouth at bedtime.     Marland Kitchen zolpidem (AMBIEN) 5 MG tablet Take 10 mg by mouth at bedtime as needed for sleep.     No current facility-administered medications for this visit.     REVIEW OF SYSTEMS:   Constitutional: Denies fevers, chills or abnormal night sweats Eyes: Denies blurriness of vision, double vision or watery eyes Ears, nose, mouth, throat, and face: Denies mucositis or sore throat Respiratory: Denies cough, dyspnea or wheezes Cardiovascular: Denies palpitation, chest discomfort or lower extremity swelling Gastrointestinal:  Denies nausea, heartburn or change in bowel  habits Lymphatics: Denies new lymphadenopathy or easy bruising Neurological:Denies numbness, tingling or new weaknesses Behavioral/Psych: Mood is stable, no new changes  All other systems were reviewed with the patient and are negative.  PHYSICAL EXAMINATION: ECOG PERFORMANCE STATUS: 1 - Symptomatic but completely ambulatory  Vitals:   02/12/17 1411  BP: (!) 171/73  Pulse: (!) 102  Resp: 18  Temp: 97.9 F (36.6 C)  SpO2: 98%   Filed Weights   02/12/17 1411  Weight: 178 lb 8 oz (81 kg)    GENERAL:alert, no distress and comfortable SKIN: She has seborrheic keratosis affecting her legs EYES: normal, conjunctiva are pink and non-injected, sclera clear OROPHARYNX:no exudate, no erythema and lips, buccal mucosa, and tongue normal  NECK: supple, thyroid normal size, non-tender,  without nodularity LYMPH:  no palpable lymphadenopathy in the cervical, axillary or inguinal LUNGS: clear to auscultation and percussion with normal breathing effort HEART: regular rate & rhythm and no murmurs and no lower extremity edema ABDOMEN:abdomen soft, non-tender and normal bowel sounds Musculoskeletal:no cyanosis of digits and no clubbing  PSYCH: alert & oriented x 3 with fluent speech NEURO: no focal motor/sensory deficits  LABORATORY DATA:  I have reviewed the data as listed Lab Results  Component Value Date   WBC 61.1 (HH) 02/12/2017   HGB 13.2 02/12/2017   HCT 41.2 02/12/2017   MCV 94.0 02/12/2017   PLT 210 02/12/2017   Recent Labs    02/12/17 1332  NA 141  K 4.1  CL 105  CO2 27  GLUCOSE 102  BUN 17  CREATININE 0.84  CALCIUM 9.8  GFRNONAA >60  GFRAA >60  PROT 6.7  ALBUMIN 3.9  AST 16  ALT 19  ALKPHOS 103  BILITOT 0.3    ASSESSMENT & PLAN:  CLL (chronic lymphocytic leukemia) ([place]) As before, I shared with her my suspicion that she might undiagnosed CLL The patient appears to be interested to pursue further workup. I have ordered FISH analysis, flow cytometry and CT  scan for further evaluation Given her rapid lymphocyte doubling time, I think she would need to be treated soon If confirmed, the most likely treatment of choice for first-line therapy is ibrutinib I briefly discussed the risk and benefits of treatment I would get my pharmacist assistance to get insurance prior authorization and determine the cause of the co-pay I plan to see her back next week to review test results and to discuss further plan of care.  History of breast cancer She had history of breast cancer We will review her imaging study in her next visit.  She has completed adjuvant treatment with tamoxifen in the past She will continue screening mammogram in the contralateral breast  Seborrheic keratoses Skin lesions resemble seborrheic keratosis I recommend dermatology review  Orders Placed This Encounter  Procedures  . CT ABDOMEN PELVIS W CONTRAST    Standing Status:   Future    Standing Expiration Date:   02/12/2018    Order Specific Question:   If indicated for the ordered procedure, I authorize the administration of contrast media per Radiology protocol    Answer:   Yes    Order Specific Question:   Preferred imaging location?    Answer:   [facility]    Order Specific Question:   Radiology Contrast Protocol - do NOT remove file path    Answer:   \\[email or web address]  . CT CHEST W CONTRAST    Standing Status:   Future    Standing Expiration Date:   02/12/2018    Order Specific Question:   If indicated for the ordered procedure, I authorize the administration of contrast media per Radiology protocol    Answer:   Yes    Order Specific Question:   Preferred imaging location?    Answer:   [facility]    Order Specific Question:   Radiology Contrast Protocol - do NOT remove file path    Answer:   \\[email or web address]     All questions were answered. The patient knows to call the clinic with any problems,  questions or concerns. I spent 40 minutes counseling the patient face to face. The total time spent in the appointment was 60 minutes and more than 50% was on counseling.     Heath Lark,  MD 02/12/2017 3:55 PM

## 2017-02-12 NOTE — Assessment & Plan Note (Addendum)
As before, I shared with her my suspicion that she might undiagnosed CLL The patient appears to be interested to pursue further workup. I have ordered FISH analysis, flow cytometry and CT scan for further evaluation Given her rapid lymphocyte doubling time, I think she would need to be treated soon If confirmed, the most likely treatment of choice for first-line therapy is ibrutinib I briefly discussed the risk and benefits of treatment I would get my pharmacist assistance to get insurance prior authorization and determine the cause of the co-pay I plan to see her back next week to review test results and to discuss further plan of care.

## 2017-02-12 NOTE — Assessment & Plan Note (Signed)
She had history of breast cancer We will review her imaging study in her next visit.  She has completed adjuvant treatment with tamoxifen in the past She will continue screening mammogram in the contralateral breast

## 2017-02-12 NOTE — Telephone Encounter (Signed)
Patient declined AVsS and calendar of upcoming January appointments. Patient scheduled per 1/21 los.

## 2017-02-13 ENCOUNTER — Telehealth: Payer: Self-pay | Admitting: Pharmacy Technician

## 2017-02-13 ENCOUNTER — Telehealth: Payer: Self-pay | Admitting: Pharmacist

## 2017-02-13 DIAGNOSIS — C9110 Chronic lymphocytic leukemia of B-cell type not having achieved remission: Secondary | ICD-10-CM

## 2017-02-13 MED ORDER — IBRUTINIB 420 MG PO TABS: 420.0000 mg | ORAL_TABLET | Freq: Every day | ORAL | 11 refills | Status: DC

## 2017-02-13 NOTE — Telephone Encounter (Signed)
Will you be here when I see her back? I think it will take me some time to convince her she needs treatment. We can apply for the grant when we see her together next week

## 2017-02-13 NOTE — Telephone Encounter (Signed)
Oral Oncology Patient Advocate Encounter  Prior Authorization for Rebecca Lowery has been approved.    PA# [id number] Effective dates: 02/13/2017 through 02/13/2018  A test claim revealed a copayment of $2476.89 for the initial month of therapy.  CLL grant funding is open at this time. Dr Alvy Bimler,  I can reach out to the patient prior to her office visit to apply for these funds if you would like me to.    Oral Oncology Clinic will continue to follow.   Fabio Asa. Melynda Keller, [place] Patient [place] [phone] 02/13/2017 12:22 PM

## 2017-02-13 NOTE — Telephone Encounter (Signed)
Oral Oncology Patient Advocate Encounter  Received notification from [facility] that prior authorization for Imbruvica is required.  PA submitted on CoverMyMeds Key [id number] Status is pending  Oral Oncology Clinic will continue to follow.  Fabio Asa. Melynda Keller, [place] Patient [place] [phone] 02/13/2017 9:18 AM

## 2017-02-13 NOTE — Telephone Encounter (Signed)
Rebecca Lowery and I will plan to meet when her when she is back in the office next week.

## 2017-02-13 NOTE — Telephone Encounter (Signed)
Oral Oncology Pharmacist Encounter  Received new prescription for Imbruvica (ibrutinib) for the treatment of CLL, planned duration until disease progression or unacceptable toxicity.  Labs from 02/12/17 assessed, OK for treatment.  02/12/17 BP elevated at 171/73, elevated from previous values, patient on 1 anti-hypertensive medication HR in tachycardic range at 102 bpm, patient appears to be tachycardic at office checks  No EKG since 2015, EKG performed 02/05/13 shows QTc 429 msec, NSR  Current medication list in Epic reviewed, no significant DDIs with Imbruuvica identified. Noted patient on diclofenac, patient will be counseled about increased risk of bruising and bleeding due to Imbruvica and increase in risk due to NSAID use.  Prescription has been e-scribed to the [facility] for benefits analysis and approval. Prior authorization is required, is submitted, and status is pending.  Oral Oncology Clinic will continue to follow for insurance authorization, copayment issues, initial counseling and start date.  We will plan to perform initial counseling in the office at 02/22/17 visit.  Johny Drilling, PharmD, BCPS, BCOP 02/13/2017 9:33 AM Oral Oncology Clinic [phone]

## 2017-02-16 ENCOUNTER — Telehealth: Payer: Self-pay

## 2017-02-16 NOTE — Telephone Encounter (Signed)
Returned pt call regarding obtaining oral contrast prior to her CT scans on Monday. Pt was advised to come in 2-3hrs prior to CT appt on Monday and stop by the cancer center to pick up her oral contrast at the front lobby desk.   Pt should drink 1st bottle at 1230 and 2nd bottle at 130pm. Pt to be NPO 4hrs prior to test. May eat a light breakfast in the morning but nothing else after 930am. Pt verbalized understanding and confirmed her appt with Dr.Gorsuch next Thursday. No further needs at this time.

## 2017-02-19 ENCOUNTER — Encounter (HOSPITAL_COMMUNITY): Payer: Self-pay

## 2017-02-19 ENCOUNTER — Ambulatory Visit (HOSPITAL_COMMUNITY)
Admission: RE | Admit: 2017-02-19 | Discharge: 2017-02-19 | Disposition: A | Discharge status: Home / Self Care | Payer: Medicare HMO | Source: Ambulatory Visit | Attending: Hematology and Oncology | Admitting: Hematology and Oncology

## 2017-02-19 DIAGNOSIS — Z9012 Acquired absence of left breast and nipple: Secondary | ICD-10-CM | POA: Diagnosis not present

## 2017-02-19 DIAGNOSIS — C9190 Lymphoid leukemia, unspecified not having achieved remission: Secondary | ICD-10-CM | POA: Insufficient documentation

## 2017-02-19 DIAGNOSIS — N2889 Other specified disorders of kidney and ureter: Secondary | ICD-10-CM | POA: Diagnosis not present

## 2017-02-19 DIAGNOSIS — R918 Other nonspecific abnormal finding of lung field: Secondary | ICD-10-CM | POA: Insufficient documentation

## 2017-02-19 DIAGNOSIS — C9110 Chronic lymphocytic leukemia of B-cell type not having achieved remission: Secondary | ICD-10-CM | POA: Diagnosis not present

## 2017-02-19 DIAGNOSIS — I700 Atherosclerosis of aorta: Secondary | ICD-10-CM | POA: Insufficient documentation

## 2017-02-19 MED ORDER — IOPAMIDOL (ISOVUE-300) INJECTION 61%
INTRAVENOUS | Status: AC
  Filled 2017-02-19: qty 100

## 2017-02-19 MED ORDER — IOPAMIDOL (ISOVUE-300) INJECTION 61%
100.0000 mL | Freq: Once | INTRAVENOUS | Status: AC | PRN
  Administered 2017-02-19: 100 mL via INTRAVENOUS

## 2017-02-20 ENCOUNTER — Emergency Department (HOSPITAL_COMMUNITY): Payer: Medicare HMO

## 2017-02-20 ENCOUNTER — Observation Stay (HOSPITAL_COMMUNITY)
Admission: EM | Admit: 2017-02-20 | Discharge: 2017-02-21 | Disposition: A | Discharge status: Home / Self Care | Payer: Medicare HMO | Attending: Internal Medicine | Admitting: Internal Medicine

## 2017-02-20 ENCOUNTER — Encounter (HOSPITAL_COMMUNITY): Payer: Self-pay

## 2017-02-20 ENCOUNTER — Other Ambulatory Visit: Payer: Self-pay

## 2017-02-20 DIAGNOSIS — M170 Bilateral primary osteoarthritis of knee: Secondary | ICD-10-CM | POA: Diagnosis not present

## 2017-02-20 DIAGNOSIS — M25512 Pain in left shoulder: Secondary | ICD-10-CM | POA: Diagnosis not present

## 2017-02-20 DIAGNOSIS — E785 Hyperlipidemia, unspecified: Secondary | ICD-10-CM | POA: Insufficient documentation

## 2017-02-20 DIAGNOSIS — K219 Gastro-esophageal reflux disease without esophagitis: Secondary | ICD-10-CM | POA: Diagnosis not present

## 2017-02-20 DIAGNOSIS — G4730 Sleep apnea, unspecified: Secondary | ICD-10-CM | POA: Diagnosis not present

## 2017-02-20 DIAGNOSIS — I700 Atherosclerosis of aorta: Secondary | ICD-10-CM | POA: Insufficient documentation

## 2017-02-20 DIAGNOSIS — Z79899 Other long term (current) drug therapy: Secondary | ICD-10-CM | POA: Insufficient documentation

## 2017-02-20 DIAGNOSIS — Z853 Personal history of malignant neoplasm of breast: Secondary | ICD-10-CM | POA: Insufficient documentation

## 2017-02-20 DIAGNOSIS — F329 Major depressive disorder, single episode, unspecified: Secondary | ICD-10-CM | POA: Diagnosis not present

## 2017-02-20 DIAGNOSIS — I10 Essential (primary) hypertension: Secondary | ICD-10-CM | POA: Diagnosis present

## 2017-02-20 DIAGNOSIS — F411 Generalized anxiety disorder: Secondary | ICD-10-CM | POA: Diagnosis present

## 2017-02-20 DIAGNOSIS — G4700 Insomnia, unspecified: Secondary | ICD-10-CM | POA: Diagnosis present

## 2017-02-20 DIAGNOSIS — I341 Nonrheumatic mitral (valve) prolapse: Secondary | ICD-10-CM | POA: Insufficient documentation

## 2017-02-20 DIAGNOSIS — R000 Tachycardia, unspecified: Secondary | ICD-10-CM | POA: Insufficient documentation

## 2017-02-20 DIAGNOSIS — Z9012 Acquired absence of left breast and nipple: Secondary | ICD-10-CM | POA: Insufficient documentation

## 2017-02-20 DIAGNOSIS — M479 Spondylosis, unspecified: Secondary | ICD-10-CM | POA: Diagnosis not present

## 2017-02-20 DIAGNOSIS — E119 Type 2 diabetes mellitus without complications: Secondary | ICD-10-CM

## 2017-02-20 DIAGNOSIS — C9190 Lymphoid leukemia, unspecified not having achieved remission: Secondary | ICD-10-CM

## 2017-02-20 DIAGNOSIS — Z7984 Long term (current) use of oral hypoglycemic drugs: Secondary | ICD-10-CM | POA: Diagnosis not present

## 2017-02-20 DIAGNOSIS — M25511 Pain in right shoulder: Secondary | ICD-10-CM | POA: Insufficient documentation

## 2017-02-20 DIAGNOSIS — Z881 Allergy status to other antibiotic agents status: Secondary | ICD-10-CM | POA: Diagnosis not present

## 2017-02-20 DIAGNOSIS — I249 Acute ischemic heart disease, unspecified: Secondary | ICD-10-CM | POA: Insufficient documentation

## 2017-02-20 DIAGNOSIS — Z96643 Presence of artificial hip joint, bilateral: Secondary | ICD-10-CM | POA: Insufficient documentation

## 2017-02-20 DIAGNOSIS — Z888 Allergy status to other drugs, medicaments and biological substances status: Secondary | ICD-10-CM | POA: Insufficient documentation

## 2017-02-20 DIAGNOSIS — R0789 Other chest pain: Principal | ICD-10-CM

## 2017-02-20 DIAGNOSIS — C9110 Chronic lymphocytic leukemia of B-cell type not having achieved remission: Secondary | ICD-10-CM | POA: Diagnosis not present

## 2017-02-20 DIAGNOSIS — K589 Irritable bowel syndrome without diarrhea: Secondary | ICD-10-CM | POA: Insufficient documentation

## 2017-02-20 DIAGNOSIS — R079 Chest pain, unspecified: Secondary | ICD-10-CM

## 2017-02-20 HISTORY — DX: Chronic lymphocytic leukemia of B-cell type not having achieved remission: C91.10

## 2017-02-20 LAB — CBC WITH DIFFERENTIAL/PLATELET
BASOS PCT: 0 %
Band Neutrophils: 0 %
Basophils Absolute: 0 10*3/uL (ref 0.0–0.1)
Blasts: 0 %
Eosinophils Absolute: 0.6 10*3/uL (ref 0.0–0.7)
Eosinophils Relative: 1 %
HCT: 40.4 % (ref 36.0–46.0)
HEMOGLOBIN: 13.4 g/dL (ref 12.0–15.0)
Lymphocytes Relative: 89 %
Lymphs Abs: 51.7 10*3/uL — ABNORMAL HIGH (ref 0.7–4.0)
MCH: 31.1 pg (ref 26.0–34.0)
MCHC: 33.2 g/dL (ref 30.0–36.0)
MCV: 93.7 fL (ref 78.0–100.0)
MONO ABS: 1.2 10*3/uL — AB (ref 0.1–1.0)
MYELOCYTES: 0 %
Metamyelocytes Relative: 0 %
Monocytes Relative: 2 %
NEUTROS PCT: 8 %
NRBC: 0 /100{WBCs}
Neutro Abs: 4.6 10*3/uL (ref 1.7–7.7)
Other: 0 %
PROMYELOCYTES ABS: 0 %
Platelets: 232 10*3/uL (ref 150–400)
RBC: 4.31 MIL/uL (ref 3.87–5.11)
RDW: 14.6 % (ref 11.5–15.5)
WBC: 58.1 10*3/uL (ref 4.0–10.5)

## 2017-02-20 LAB — COMPREHENSIVE METABOLIC PANEL
ALK PHOS: 87 U/L (ref 38–126)
ALT: 22 U/L (ref 14–54)
AST: 23 U/L (ref 15–41)
Albumin: 4.2 g/dL (ref 3.5–5.0)
Anion gap: 8 (ref 5–15)
BUN: 18 mg/dL (ref 6–20)
CALCIUM: 9.5 mg/dL (ref 8.9–10.3)
CO2: 26 mmol/L (ref 22–32)
CREATININE: 0.86 mg/dL (ref 0.44–1.00)
Chloride: 104 mmol/L (ref 101–111)
GFR calc non Af Amer: >60 mL/min (ref >60)
Glucose, Bld: 90 mg/dL (ref 65–99)
Potassium: 4.5 mmol/L (ref 3.5–5.1)
SODIUM: 138 mmol/L (ref 135–145)
Total Bilirubin: 0.2 mg/dL — ABNORMAL LOW (ref 0.3–1.2)
Total Protein: 6.9 g/dL (ref 6.5–8.1)

## 2017-02-20 LAB — TROPONIN I: Troponin I: <0.03 ng/mL (ref <0.03)

## 2017-02-20 LAB — LIPID PANEL
CHOLESTEROL: 208 mg/dL — AB (ref 0–200)
HDL: 60 mg/dL (ref >40)
LDL CALC: 116 mg/dL — AB (ref 0–99)
TRIGLYCERIDES: 160 mg/dL — AB (ref <150)
Total CHOL/HDL Ratio: 3.5 RATIO
VLDL: 32 mg/dL (ref 0–40)

## 2017-02-20 LAB — MAGNESIUM: Magnesium: 2.4 mg/dL (ref 1.7–2.4)

## 2017-02-20 LAB — HEMOGLOBIN A1C
Hgb A1c MFr Bld: 5.9 % — ABNORMAL HIGH (ref 4.8–5.6)
Mean Plasma Glucose: 122.63 mg/dL

## 2017-02-20 LAB — GLUCOSE, CAPILLARY: GLUCOSE-CAPILLARY: 94 mg/dL (ref 65–99)

## 2017-02-20 LAB — TSH: TSH: 1.47 u[IU]/mL (ref 0.350–4.500)

## 2017-02-20 MED ORDER — ASPIRIN EC 81 MG PO TBEC
81.0000 mg | DELAYED_RELEASE_TABLET | Freq: Every day | ORAL | Status: DC
  Administered 2017-02-21: 81 mg via ORAL
  Filled 2017-02-20: qty 1

## 2017-02-20 MED ORDER — BUPROPION HCL ER (XL) 150 MG PO TB24
150.0000 mg | ORAL_TABLET | Freq: Every day | ORAL | Status: DC
  Administered 2017-02-21: 150 mg via ORAL
  Filled 2017-02-20: qty 1

## 2017-02-20 MED ORDER — ORAL CARE MOUTH RINSE
15.0000 mL | Freq: Two times a day (BID) | OROMUCOSAL | Status: DC
  Administered 2017-02-21 (×2): 15 mL via OROMUCOSAL

## 2017-02-20 MED ORDER — ATORVASTATIN CALCIUM 40 MG PO TABS
40.0000 mg | ORAL_TABLET | Freq: Every day | ORAL | Status: DC
  Administered 2017-02-20 – 2017-02-21 (×2): 40 mg via ORAL
  Filled 2017-02-20 (×2): qty 1

## 2017-02-20 MED ORDER — METOPROLOL TARTRATE 25 MG PO TABS: 25.0000 mg | ORAL_TABLET | Freq: Two times a day (BID) | ORAL | Status: DC

## 2017-02-20 MED ORDER — SODIUM CHLORIDE 0.9 % IV SOLN
INTRAVENOUS | Status: DC
  Administered 2017-02-20: 18:00:00 via INTRAVENOUS

## 2017-02-20 MED ORDER — LOSARTAN POTASSIUM 50 MG PO TABS
100.0000 mg | ORAL_TABLET | Freq: Every day | ORAL | Status: DC
  Administered 2017-02-21: 100 mg via ORAL
  Filled 2017-02-20: qty 2

## 2017-02-20 MED ORDER — OMEPRAZOLE MAGNESIUM 20 MG PO TBEC: 20.0000 mg | DELAYED_RELEASE_TABLET | Freq: Every day | ORAL | Status: DC

## 2017-02-20 MED ORDER — ENOXAPARIN SODIUM 40 MG/0.4ML ~~LOC~~ SOLN
40.0000 mg | SUBCUTANEOUS | Status: DC
  Administered 2017-02-20: 40 mg via SUBCUTANEOUS
  Filled 2017-02-20: qty 0.4

## 2017-02-20 MED ORDER — ALUM & MAG HYDROXIDE-SIMETH 200-200-20 MG/5ML PO SUSP
15.0000 mL | Freq: Once | ORAL | Status: AC
  Administered 2017-02-20: 15 mL via ORAL
  Filled 2017-02-20: qty 30

## 2017-02-20 MED ORDER — VITAMIN D3 25 MCG (1000 UNIT) PO TABS
1000.0000 [IU] | ORAL_TABLET | Freq: Every day | ORAL | Status: DC
  Administered 2017-02-21: 1000 [IU] via ORAL
  Filled 2017-02-20: qty 1

## 2017-02-20 MED ORDER — TEMAZEPAM 15 MG PO CAPS
15.0000 mg | ORAL_CAPSULE | Freq: Every evening | ORAL | Status: DC | PRN
  Administered 2017-02-20: 15 mg via ORAL
  Filled 2017-02-20: qty 1

## 2017-02-20 MED ORDER — ONDANSETRON HCL 4 MG/2ML IJ SOLN: 4.0000 mg | Freq: Four times a day (QID) | INTRAMUSCULAR | Status: DC | PRN

## 2017-02-20 MED ORDER — OMEPRAZOLE 20 MG PO CPDR
20.0000 mg | DELAYED_RELEASE_CAPSULE | Freq: Every day | ORAL | Status: DC
  Administered 2017-02-20: 20 mg via ORAL
  Filled 2017-02-20 (×2): qty 1

## 2017-02-20 MED ORDER — CHLORHEXIDINE GLUCONATE 0.12 % MT SOLN
15.0000 mL | Freq: Two times a day (BID) | OROMUCOSAL | Status: DC
  Administered 2017-02-20 – 2017-02-21 (×2): 15 mL via OROMUCOSAL
  Filled 2017-02-20 (×2): qty 15

## 2017-02-20 MED ORDER — INSULIN ASPART 100 UNIT/ML ~~LOC~~ SOLN: 0.0000 [IU] | Freq: Three times a day (TID) | SUBCUTANEOUS | Status: DC

## 2017-02-20 MED ORDER — TRAMADOL HCL 50 MG PO TABS
50.0000 mg | ORAL_TABLET | Freq: Three times a day (TID) | ORAL | Status: DC | PRN
Start: 2017-02-20 — End: 2017-02-21
  Administered 2017-02-20: 50 mg via ORAL
  Filled 2017-02-20: qty 1

## 2017-02-20 MED ORDER — ASPIRIN 81 MG PO CHEW
324.0000 mg | CHEWABLE_TABLET | Freq: Once | ORAL | Status: AC
Start: 2017-02-20 — End: 2017-02-20
  Administered 2017-02-20: 324 mg via ORAL
  Filled 2017-02-20: qty 4

## 2017-02-20 MED ORDER — CARVEDILOL 6.25 MG PO TABS
6.2500 mg | ORAL_TABLET | Freq: Two times a day (BID) | ORAL | Status: DC
  Administered 2017-02-20 – 2017-02-21 (×3): 6.25 mg via ORAL
  Filled 2017-02-20 (×3): qty 1

## 2017-02-20 MED ORDER — VITAMIN B-12 1000 MCG PO TABS
1000.0000 ug | ORAL_TABLET | Freq: Every day | ORAL | Status: DC
  Administered 2017-02-21: 1000 ug via ORAL
  Filled 2017-02-20: qty 1

## 2017-02-20 MED ORDER — AMLODIPINE BESYLATE 5 MG PO TABS: 5.0000 mg | ORAL_TABLET | Freq: Every day | ORAL | Status: DC

## 2017-02-20 MED ORDER — ACETAMINOPHEN 325 MG PO TABS
650.0000 mg | ORAL_TABLET | ORAL | Status: DC | PRN
  Administered 2017-02-21 (×2): 650 mg via ORAL
  Filled 2017-02-20 (×2): qty 2

## 2017-02-20 NOTE — ED Notes (Signed)
RN notified of abnormal labs 

## 2017-02-20 NOTE — ED Notes (Signed)
ED TO INPATIENT HANDOFF REPORT  Name/Age/Gender Rebecca Lowery 77 y.o. female  Code Status Code Status History    Date Active Date Inactive Code Status Order ID Comments User Context   01/31/2013 13:41 02/04/2013 17:00 Full Code [id number]  Mcarthur Rossetti, MD Inpatient   03/29/2012 14:14 04/01/2012 14:33 Full Code [id number]  Mcarthur Rossetti, MD Inpatient      Home/SNF/Other [facility] in [facility]  Chief Complaint Sickle cell pain crisis  Level of Care/Admitting Diagnosis ED Disposition    ED Disposition Condition [facility]: [facility] [[id number]]  Level of Care: Telemetry [5]  Admit to tele based on following criteria: Monitor for Ischemic changes  Diagnosis: ACS (acute coronary syndrome) [facility]) [[id number]]  Admitting Physician: Bennie Pierini [[id number]]  Attending Physician: Jonnie Finner, ADAM ROSS [[id number]]  PT Class (Do Not Modify): Observation [104]  PT Acc Code (Do Not Modify): Observation [10022]       Medical History Past Medical History:  Diagnosis Date  . Anginal pain ([place])    09/2012, checked out and was told it was stress  . Anxiety   . Arthritis    OA BOTH HIPS, KNEES AND BACK.  SEVER PAIN IN RIGHT HIP  . Breast cancer ([place])    left mastectomy detected on mammogram  . Cancer ([place]) 1991   LEFT MASTECTOMY FOR BREAST CANCER--PT NOT SURE IF SHE IS TO AVOID NEEDLES LEFT ARM-DID HAVE AXILLARY LYMPH NODES REMOVED  . Chest pain    HX OF CHEST PAIN --NUCLEAR STRESS TEST 04/19/11 NORMAL--PT STATES SHE WAS TOLD STRESS MAY BE THE CAUSE OF HER CHEST PAINS  . CLL (chronic lymphocytic leukemia) ([place])   . Depression   . Diabetes mellitus without complication ([place])   . Family history of anesthesia complication    PT'S SON HAD ANESTHESIA PROBLEM WITH EXCISION OF WISDOM TEETH ---THRASING AROUND--BUT DID GO HOME SAME DAY OF PROCEDURE  . GERD (gastroesophageal reflux disease)   . Heart murmur    MVP - PT THINKS HER CHEST  PAIN IS RELATED TO HER MVP  . Hemorrhoid   . Hyperlipemia   . Hypertension   . IBS (irritable bowel syndrome)   . IGT (impaired glucose tolerance)   . Insomnia   . Need for vaccination against Streptococcus pneumoniae 04/04/2013  . Sleep apnea    HAS CPAP MACHINE-BUT NO LONGER USES    Allergies Allergies  Allergen Reactions  . Ciprofloxacin Hives    REACTION UNKNOWN  . Quinolones Hives    IV Location/Drains/Wounds Patient Lines/Drains/Airways Status   Active Line/Drains/Airways    Name:   Placement date:   Placement time:   Site:   Days:   Peripheral IV 02/20/17 Right   02/20/17    1746    -   less than 1          Labs/Imaging Results for orders placed or performed during the hospital encounter of 02/20/17 (from the past 48 hour(s))  CBC with Differential/Platelet     Status: Abnormal   Collection Time: 02/20/17  4:11 PM  Result Value Ref Range   WBC 58.1 (HH) 4.0 - 10.5 K/uL    Comment: REPEATED TO VERIFY CRITICAL RESULT CALLED TO, READ BACK BY AND VERIFIED WITH: WEST,S @ 1701 ON [id number] BY POTEAT,S    RBC 4.31 3.87 - 5.11 MIL/uL   Hemoglobin 13.4 12.0 - 15.0 g/dL   HCT 40.4 36.0 - 46.0 %   MCV 93.7 78.0 -  100.0 fL   MCH 31.1 26.0 - 34.0 pg   MCHC 33.2 30.0 - 36.0 g/dL   RDW 14.6 11.5 - 15.5 %   Platelets 232 150 - 400 K/uL   Neutrophils Relative % 8 %   Lymphocytes Relative 89 %   Monocytes Relative 2 %   Eosinophils Relative 1 %   Basophils Relative 0 %   Band Neutrophils 0 %   Metamyelocytes Relative 0 %   Myelocytes 0 %   Promyelocytes Absolute 0 %   Blasts 0 %   nRBC 0 0 /100 WBC   Other 0 %   Neutro Abs 4.6 1.7 - 7.7 K/uL   Lymphs Abs 51.7 (H) 0.7 - 4.0 K/uL   Monocytes Absolute 1.2 (H) 0.1 - 1.0 K/uL   Eosinophils Absolute 0.6 0.0 - 0.7 K/uL   Basophils Absolute 0.0 0.0 - 0.1 K/uL   WBC Morphology ABSOLUTE LYMPHOCYTOSIS     Comment: ATYPICAL LYMPHOCYTES  Comprehensive metabolic panel     Status: Abnormal   Collection Time: 02/20/17  4:11 PM   Result Value Ref Range   Sodium 138 135 - 145 mmol/L   Potassium 4.5 3.5 - 5.1 mmol/L   Chloride 104 101 - 111 mmol/L   CO2 26 22 - 32 mmol/L   Glucose, Bld 90 65 - 99 mg/dL   BUN 18 6 - 20 mg/dL   Creatinine, Ser 0.86 0.44 - 1.00 mg/dL   Calcium 9.5 8.9 - 10.3 mg/dL   Total Protein 6.9 6.5 - 8.1 g/dL   Albumin 4.2 3.5 - 5.0 g/dL   AST 23 15 - 41 U/L   ALT 22 14 - 54 U/L   Alkaline Phosphatase 87 38 - 126 U/L   Total Bilirubin 0.2 (L) 0.3 - 1.2 mg/dL   GFR calc non Af Amer >60 >60 mL/min   GFR calc Af Amer >60 >60 mL/min    Comment: (NOTE) The eGFR has been calculated using the CKD EPI equation. This calculation has not been validated in all clinical situations. eGFR's persistently <60 mL/min signify possible Chronic Kidney Disease.    Anion gap 8 5 - 15  Troponin I     Status: None   Collection Time: 02/20/17  4:11 PM  Result Value Ref Range   Troponin I <0.03 <0.03 ng/mL   Dg Chest 2 View  Result Date: 02/20/2017 CLINICAL DATA:  Left side chest pain x 3-4 days. States that she's under a lot of stress. Pt states that she started to feel pain in her left foot today. Hx diabetes, htn EXAM: CHEST  2 VIEW COMPARISON:  Chest CT, 02/19/2017.  Chest radiographs, 01/27/2013. FINDINGS: Cardiac silhouette is normal in size and configuration. No mediastinal or hilar masses. No convincing adenopathy. Clear lungs.  No pleural effusion or pneumothorax. Stable changes from a left mastectomy. Skeletal structures are intact. IMPRESSION: No active cardiopulmonary disease. Electronically Signed   By: Lajean Manes M.D.   On: 02/20/2017 15:56   Ct Chest W Contrast  Result Date: 02/19/2017 CLINICAL DATA:  Chronic lymphocytic leukemia. EXAM: CT CHEST, ABDOMEN, AND PELVIS WITH CONTRAST TECHNIQUE: Multidetector CT imaging of the chest, abdomen and pelvis was performed following the standard protocol during bolus administration of intravenous contrast. CONTRAST:  177m ISOVUE-300 IOPAMIDOL (ISOVUE-300)  INJECTION 61% COMPARISON:  None. FINDINGS: CT CHEST FINDINGS Cardiovascular: The heart size appears normal. No pericardial effusion. Calcification in the left anterior descending coronary artery noted. RCA coronary artery calcification also noted Mediastinum/Nodes: No enlarged mediastinal,  hilar, or axillary lymph nodes. Thyroid gland, trachea demonstrate no significant findings. Small hiatal hernia. Lungs/Pleura: No pleural effusion. No airspace consolidation or atelectasis. Tiny nodule within the lingula measures 3 mm, image 93 of series 4. Musculoskeletal: Previous left mastectomy and left axillary nodal dissection. No aggressive lytic or sclerotic bone lesions identified. CT ABDOMEN PELVIS FINDINGS Hepatobiliary: Scattered simple appearing liver cysts noted. No suspicious liver abnormality. Previous cholecystectomy. No biliary dilatation. Pancreas: Unremarkable. No pancreatic ductal dilatation or surrounding inflammatory changes. Spleen: Normal in size without focal abnormality. Adrenals/Urinary Tract: The adrenal glands appear normal. There is a exophytic hyperdense lesion arising from the posterior cortex of the inferior pole of the right kidney measuring 1 cm and 40 HU, image 70 of series 2. Small hypodensity within the left kidney measures 7 mm and is too small to characterize. No hydronephrosis. Stomach/Bowel: No abnormal distension of the stomach. The small bowel loops have a normal course and caliber. No bowel obstruction. Extensive sigmoid diverticulosis identified. No acute inflammation. Vascular/Lymphatic: Mild aortic atherosclerosis. No aneurysm. No adenopathy identified within the abdomen. No enlarged pelvic or inguinal adenopathy. Reproductive: Status post hysterectomy. No adnexal masses. Other: No free fluid or fluid collections. Musculoskeletal: Previous bilateral hip arthroplasty. Degenerative disc disease and scoliosis identified within the lumbar spine. No suspicious bone lesions. IMPRESSION:  1. No mass or adenopathy identified within the chest, abdomen or pelvis. 2. The spleen is normal in size and appearance 3. Status post left mastectomy and left axillary nodal dissection. 4. Tiny nodule in the left upper lobe measures 3 mm. No follow-up needed if patient is low-risk. Non-contrast chest CT can be considered in 12 months if patient is high-risk. This recommendation follows the consensus statement: Guidelines for Management of Incidental Pulmonary Nodules Detected on CT Images: From the Fleischner Society 2017; Radiology 2017; 284:228-243. 5.  Aortic Atherosclerosis (ICD10-I70.0). 6. There is a small indeterminate intermediate attenuating lesion arising from the right kidney. This could represent a hemorrhagic cyst or solid enhancing neoplasm. More definitive assessment could be obtained with renal protocol MRI. Electronically Signed   By: Kerby Moors M.D.   On: 02/19/2017 16:22   Ct Abdomen Pelvis W Contrast  Result Date: 02/19/2017 CLINICAL DATA:  Chronic lymphocytic leukemia. EXAM: CT CHEST, ABDOMEN, AND PELVIS WITH CONTRAST TECHNIQUE: Multidetector CT imaging of the chest, abdomen and pelvis was performed following the standard protocol during bolus administration of intravenous contrast. CONTRAST:  129m ISOVUE-300 IOPAMIDOL (ISOVUE-300) INJECTION 61% COMPARISON:  None. FINDINGS: CT CHEST FINDINGS Cardiovascular: The heart size appears normal. No pericardial effusion. Calcification in the left anterior descending coronary artery noted. RCA coronary artery calcification also noted Mediastinum/Nodes: No enlarged mediastinal, hilar, or axillary lymph nodes. Thyroid gland, trachea demonstrate no significant findings. Small hiatal hernia. Lungs/Pleura: No pleural effusion. No airspace consolidation or atelectasis. Tiny nodule within the lingula measures 3 mm, image 93 of series 4. Musculoskeletal: Previous left mastectomy and left axillary nodal dissection. No aggressive lytic or sclerotic bone  lesions identified. CT ABDOMEN PELVIS FINDINGS Hepatobiliary: Scattered simple appearing liver cysts noted. No suspicious liver abnormality. Previous cholecystectomy. No biliary dilatation. Pancreas: Unremarkable. No pancreatic ductal dilatation or surrounding inflammatory changes. Spleen: Normal in size without focal abnormality. Adrenals/Urinary Tract: The adrenal glands appear normal. There is a exophytic hyperdense lesion arising from the posterior cortex of the inferior pole of the right kidney measuring 1 cm and 40 HU, image 70 of series 2. Small hypodensity within the left kidney measures 7 mm and is too small to characterize. No  hydronephrosis. Stomach/Bowel: No abnormal distension of the stomach. The small bowel loops have a normal course and caliber. No bowel obstruction. Extensive sigmoid diverticulosis identified. No acute inflammation. Vascular/Lymphatic: Mild aortic atherosclerosis. No aneurysm. No adenopathy identified within the abdomen. No enlarged pelvic or inguinal adenopathy. Reproductive: Status post hysterectomy. No adnexal masses. Other: No free fluid or fluid collections. Musculoskeletal: Previous bilateral hip arthroplasty. Degenerative disc disease and scoliosis identified within the lumbar spine. No suspicious bone lesions. IMPRESSION: 1. No mass or adenopathy identified within the chest, abdomen or pelvis. 2. The spleen is normal in size and appearance 3. Status post left mastectomy and left axillary nodal dissection. 4. Tiny nodule in the left upper lobe measures 3 mm. No follow-up needed if patient is low-risk. Non-contrast chest CT can be considered in 12 months if patient is high-risk. This recommendation follows the consensus statement: Guidelines for Management of Incidental Pulmonary Nodules Detected on CT Images: From the Fleischner Society 2017; Radiology 2017; 284:228-243. 5.  Aortic Atherosclerosis (ICD10-I70.0). 6. There is a small indeterminate intermediate attenuating lesion  arising from the right kidney. This could represent a hemorrhagic cyst or solid enhancing neoplasm. More definitive assessment could be obtained with renal protocol MRI. Electronically Signed   By: Kerby Moors M.D.   On: 02/19/2017 16:22    Pending Labs Unresulted Labs (From admission, onward)   Start     Ordered   02/20/17 1611  Pathologist smear review  Once,   STAT     02/20/17 1611   Signed and Held  Hemoglobin A1c  Add-on,   R    Comments:  To assess prior glycemic control    Signed and Held   Signed and Held  Troponin I-serum (0, 3, 6 hours)  Now then every 3 hours,   TIMED     Signed and Held   Signed and Held  TSH  Add-on,   R     Signed and Held   Signed and Held  Lipid panel  Add-on,   R     Signed and Held   Signed and Held  CBC  Tomorrow morning,   R     Signed and Held   Signed and Held  Magnesium  Add-on,   R     Signed and Held   Signed and Held  Basic metabolic panel  Tomorrow morning,   R     Signed and Held      Vitals/Pain Today's Vitals   02/20/17 1530 02/20/17 1552 02/20/17 1723 02/20/17 1852  BP:  (!) 163/79  (!) 154/86  Pulse:  98  77  Resp:  18  16  Temp:  98.3 F (36.8 C)    TempSrc:  Oral    SpO2:  97%  99%  Weight:  178 lb (80.7 kg)    Height:  '5\' 2"'  (1.575 m)    PainSc: 3   3      Isolation Precautions No active isolations  Medications Medications  0.9 %  sodium chloride infusion ( Intravenous Transfusing/Transfer 02/20/17 1943)  aspirin chewable tablet 324 mg (324 mg Oral Given 02/20/17 1752)    Mobility independent

## 2017-02-20 NOTE — H&P (Signed)
History and Physical    Rebecca Lowery:[id number] DOB: [birth year] DOA: 02/20/2017  PCP: Prince Solian, MD Patient coming from: [facility]  I have personally briefly reviewed patient's old medical records in [place]  Chief Complaint: Chest pain  HPI: Rebecca Lowery is a 77 y.o. female with medical history significant for CLL, hypertension, type 2 diabetes mellitus, GERD, anxiety/MDD, remote breast cancer status post tamoxifen therapy who presents with 3-4 days of aching chest pain located on the left side, not associated with diaphoresis, nausea, vomiting.  Patient does also note pain in her left shoulder and left arm, as well as her right shoulder, however this pain does not seem to be related to the pain in her chest.  She notes a history of frequent point tenderness on the left side of her chest, but describes this pain is a different sensation.  She is unable to describe the pain well, but does state that it is approximately 3 out of 10 on a pain scale.  It is not related to exertion.  It is worsened with rotation.  She states that the sensation has been constant over the last 3-4 days and occasionally notes a fluttering sensation.  He admits that she is an anxious person and was recently diagnosed with CLL by her outpatient oncologist, and is wondering if that is playing a role.  She denies worsening of her symptoms with any particular food intake.  She is a lifelong non-smoker, no strong family history for coronary disease.  Chest x-ray showed no abnormality and EKG demonstrated heart rate of 97 with no ST-T changes suggestive of acute ischemia.  Of note, the patient recently discontinued amlodipine due to gastrointestinal side effects.  ED Course: In the ED she is afebrile, mildly tachycardic into the 90s, blood pressure 154-163/79-86, saturating well on room air.  Labs notable for white count of 58.1, 89% lymphs, Hgb 13.4, platelets 232, normal electrolytes and renal  function, normal LFTs.  Patient was given aspirin 325 while in the emergency department.  Review of Systems: As per HPI otherwise 10 point review of systems negative.   Past Medical History:  Diagnosis Date  . Anginal pain ([place])    09/2012, checked out and was told it was stress  . Anxiety   . Arthritis    OA BOTH HIPS, KNEES AND BACK.  SEVER PAIN IN RIGHT HIP  . Breast cancer ([place])    left mastectomy detected on mammogram  . Cancer ([place]) 1991   LEFT MASTECTOMY FOR BREAST CANCER--PT NOT SURE IF SHE IS TO AVOID NEEDLES LEFT ARM-DID HAVE AXILLARY LYMPH NODES REMOVED  . Chest pain    HX OF CHEST PAIN --NUCLEAR STRESS TEST 04/19/11 NORMAL--PT STATES SHE WAS TOLD STRESS MAY BE THE CAUSE OF HER CHEST PAINS  . CLL (chronic lymphocytic leukemia) ([place])   . Depression   . Diabetes mellitus without complication ([place])   . Family history of anesthesia complication    PT'S SON HAD ANESTHESIA PROBLEM WITH EXCISION OF WISDOM TEETH ---THRASING AROUND--BUT DID GO HOME SAME DAY OF PROCEDURE  . GERD (gastroesophageal reflux disease)   . Heart murmur    MVP - PT THINKS HER CHEST PAIN IS RELATED TO HER MVP  . Hemorrhoid   . Hyperlipemia   . Hypertension   . IBS (irritable bowel syndrome)   . IGT (impaired glucose tolerance)   . Insomnia   . Need for vaccination against Streptococcus pneumoniae 04/04/2013  . Sleep apnea  HAS CPAP MACHINE-BUT NO LONGER USES    Past Surgical History:  Procedure Laterality Date  . ABDOMINAL HYSTERECTOMY    . BREAST SURGERY     LEFT MASTECTOMY AND AXILLARY DISSECTION FOR BREAST CANCER  . CHOLECYSTECTOMY    . MASTECTOMY    . TONSILLECTOMY     AS A TEENAGER  . TOTAL HIP ARTHROPLASTY Right 03/29/2012   Procedure: Right TOTAL HIP ARTHROPLASTY ANTERIOR APPROACH;  Surgeon: Mcarthur Rossetti, MD;  Location: [facility];  Service: Orthopedics;  Laterality: Right;  . TOTAL HIP ARTHROPLASTY Left 01/31/2013   Procedure: LEFT TOTAL HIP ARTHROPLASTY ANTERIOR APPROACH;  Surgeon:  Mcarthur Rossetti, MD;  Location: [facility];  Service: Orthopedics;  Laterality: Left;     reports that  has never smoked. she has never used smokeless tobacco. She reports that she drinks alcohol. She reports that she does not use drugs.  Allergies  Allergen Reactions  . Ciprofloxacin Hives    REACTION UNKNOWN  . Quinolones Hives    Family History  Problem Relation Age of Onset  . Cancer Mother        breast ca  . Breast cancer Mother   . Kidney disease Father   . Diabetes Father     Prior to Admission medications   Medication Sig Start Date End Date Taking? Authorizing Provider  amLODipine (NORVASC) 5 MG tablet Take 5 mg by mouth daily. 02/05/17  Yes [provider]  buPROPion (WELLBUTRIN XL) 150 MG 24 hr tablet Take 150 mg by mouth daily.   Yes [provider]  Cholecalciferol (VITAMIN D PO) Take 1,000 Units by mouth daily.   Yes [provider]  Cyanocobalamin (VITAMIN B-12 PO) Take 1 tablet by mouth daily.   Yes [provider]  diclofenac (VOLTAREN) 75 MG EC tablet TAKE 1 TABLET (75 MG TOTAL) BY MOUTH 2 (TWO) TIMES DAILY. TAKE WITH FOOD 02/05/17  Yes Magnus Sinning, MD  Docusate Calcium (STOOL SOFTENER PO) Take 1 tablet by mouth daily as needed (CONSTIPATION).   Yes [provider]  fluticasone (FLONASE) 50 MCG/ACT nasal spray Place 1 spray into both nostrils daily as needed for allergies or rhinitis.   Yes [provider]  LORazepam (ATIVAN) 1 MG tablet Take 1 mg by mouth at bedtime.    Yes [provider]  losartan (COZAAR) 100 MG tablet Take 100 mg by mouth daily.    Yes [provider]  Multiple Vitamins-Calcium (ONE-A-DAY WOMENS PO) Take 1 tablet by mouth daily.   Yes [provider]  mupirocin ointment (BACTROBAN) 2 % Apply 1 application topically daily. 02/05/17  Yes [provider]  omeprazole (PRILOSEC OTC) 20 MG tablet Take 20 mg by mouth at bedtime.    Yes [provider]  zolpidem (AMBIEN) 10 MG tablet Take 15 mg by mouth at bedtime as needed for sleep.    Yes [provider]  Ibrutinib 420 MG TABS Take 420 mg by mouth daily. Take with a full glass of water at approximately the same time daily. Maintain hydration. Patient not taking: Reported on 02/20/2017 02/13/17   Heath Lark, MD  methocarbamol (ROBAXIN) 500 MG tablet Take 1 tablet (500 mg total) by mouth every 6 (six) hours as needed for muscle spasms. Patient not taking: Reported on 02/20/2017 11/08/16   Mcarthur Rossetti, MD    Physical Exam: Vitals:   02/20/17 1552 02/20/17 1852 02/20/17 1946 02/20/17 2011  BP: (!) 163/79 (!) 154/86 138/70 (!) 174/81  Pulse: 98  77 85 86  Resp: 18 16 18 20   Temp: 98.3 F (36.8 C)   97.6 F (36.4 C)  TempSrc: Oral   Oral  SpO2: 97% 99% 99% 97%  Weight: 80.7 kg (178 lb)     Height: 5\' 2"  (1.575 m)       Constitutional: NAD, calm, comfortable Vitals:   02/20/17 1552 02/20/17 1852 02/20/17 1946 02/20/17 2011  BP: (!) 163/79 (!) 154/86 138/70 (!) 174/81  Pulse: 98 77 85 86  Resp: 18 16 18 20   Temp: 98.3 F (36.8 C)   97.6 F (36.4 C)  TempSrc: Oral   Oral  SpO2: 97% 99% 99% 97%  Weight: 80.7 kg (178 lb)     Height: 5\' 2"  (1.575 m)      Eyes: PERRL, lids and conjunctivae normal ENMT: Mucous membranes are moist. Posterior pharynx clear of any exudate or lesions Neck: normal, supple, no masses Respiratory: clear to auscultation bilaterally, no wheezing, no crackles. Normal respiratory effort. No accessory muscle use.  Cardiovascular: Regular rate and rhythm, no murmurs / rubs / gallops. No extremity edema. 2+ pedal pulses. No carotid bruits.  Abdomen: no tenderness, no masses palpated. Bowel sounds positive.  Musculoskeletal: no clubbing / cyanosis. No joint deformity upper and lower extremities. Good ROM, no contractures. Skin: no rashes, lesions, ulcers. No induration Neurologic: CN 2-12 grossly intact.  Grossly normal strength  and sensation. Psychiatric: Normal judgment and insight. Alert and oriented x 3.  Slightly anxious.  Labs on Admission: I have personally reviewed following labs and imaging studies  CBC: Recent Labs  Lab 02/20/17 1611  WBC 58.1*  NEUTROABS 4.6  HGB 13.4  HCT 40.4  MCV 93.7  PLT 694   Basic Metabolic Panel: Recent Labs  Lab 02/20/17 1611 02/20/17 2055  NA 138  --   K 4.5  --   CL 104  --   CO2 26  --   GLUCOSE 90  --   BUN 18  --   CREATININE 0.86  --   CALCIUM 9.5  --   MG  --  2.4   GFR: Estimated Creatinine Clearance: 54.7 mL/min (by C-G formula based on SCr of 0.86 mg/dL). Liver Function Tests: Recent Labs  Lab 02/20/17 1611  AST 23  ALT 22  ALKPHOS 87  BILITOT 0.2*  PROT 6.9  ALBUMIN 4.2   No results for input(s): LIPASE, AMYLASE in the last 168 hours. No results for input(s): AMMONIA in the last 168 hours. Coagulation Profile: No results for input(s): INR, PROTIME in the last 168 hours. Cardiac Enzymes: Recent Labs  Lab 02/20/17 1611 02/20/17 2055  TROPONINI <0.03 <0.03   BNP (last 3 results) No results for input(s): PROBNP in the last 8760 hours. HbA1C: No results for input(s): HGBA1C in the last 72 hours. CBG: No results for input(s): GLUCAP in the last 168 hours. Lipid Profile: Recent Labs    02/20/17 1626  CHOL 208*  HDL 60  LDLCALC 116*  TRIG 160*  CHOLHDL 3.5   Thyroid Function Tests: Recent Labs    02/20/17 1626  TSH 1.470   Anemia Panel: No results for input(s): VITAMINB12, FOLATE, FERRITIN, TIBC, IRON, RETICCTPCT in the last 72 hours. Urine analysis:    Component Value Date/Time   COLORURINE YELLOW 01/27/2013 1345   APPEARANCEUR CLOUDY (A) 01/27/2013 1345   LABSPEC 1.015 01/27/2013 1345   PHURINE 7.0 01/27/2013 1345   GLUCOSEU NEGATIVE 01/27/2013 1345   HGBUR TRACE (A) 01/27/2013 1345   BILIRUBINUR NEGATIVE  01/27/2013 1345   [place] 01/27/2013 1345   PROTEINUR NEGATIVE 01/27/2013 1345   UROBILINOGEN  1.0 01/27/2013 1345   NITRITE NEGATIVE 01/27/2013 1345   LEUKOCYTESUR TRACE (A) 01/27/2013 1345    Radiological Exams on Admission: Dg Chest 2 View  Result Date: 02/20/2017 CLINICAL DATA:  Left side chest pain x 3-4 days. States that she's under a lot of stress. Pt states that she started to feel pain in her left foot today. Hx diabetes, htn EXAM: CHEST  2 VIEW COMPARISON:  Chest CT, 02/19/2017.  Chest radiographs, 01/27/2013. FINDINGS: Cardiac silhouette is normal in size and configuration. No mediastinal or hilar masses. No convincing adenopathy. Clear lungs.  No pleural effusion or pneumothorax. Stable changes from a left mastectomy. Skeletal structures are intact. IMPRESSION: No active cardiopulmonary disease. Electronically Signed   By: Lajean Manes M.D.   On: 02/20/2017 15:56   Ct Chest W Contrast  Result Date: 02/19/2017 CLINICAL DATA:  Chronic lymphocytic leukemia. EXAM: CT CHEST, ABDOMEN, AND PELVIS WITH CONTRAST TECHNIQUE: Multidetector CT imaging of the chest, abdomen and pelvis was performed following the standard protocol during bolus administration of intravenous contrast. CONTRAST:  175mL ISOVUE-300 IOPAMIDOL (ISOVUE-300) INJECTION 61% COMPARISON:  None. FINDINGS: CT CHEST FINDINGS Cardiovascular: The heart size appears normal. No pericardial effusion. Calcification in the left anterior descending coronary artery noted. RCA coronary artery calcification also noted Mediastinum/Nodes: No enlarged mediastinal, hilar, or axillary lymph nodes. Thyroid gland, trachea demonstrate no significant findings. Small hiatal hernia. Lungs/Pleura: No pleural effusion. No airspace consolidation or atelectasis. Tiny nodule within the lingula measures 3 mm, image 93 of series 4. Musculoskeletal: Previous left mastectomy and left axillary nodal dissection. No aggressive lytic or sclerotic bone lesions identified. CT ABDOMEN PELVIS FINDINGS Hepatobiliary: Scattered simple appearing liver cysts noted. No  suspicious liver abnormality. Previous cholecystectomy. No biliary dilatation. Pancreas: Unremarkable. No pancreatic ductal dilatation or surrounding inflammatory changes. Spleen: Normal in size without focal abnormality. Adrenals/Urinary Tract: The adrenal glands appear normal. There is a exophytic hyperdense lesion arising from the posterior cortex of the inferior pole of the right kidney measuring 1 cm and 40 HU, image 70 of series 2. Small hypodensity within the left kidney measures 7 mm and is too small to characterize. No hydronephrosis. Stomach/Bowel: No abnormal distension of the stomach. The small bowel loops have a normal course and caliber. No bowel obstruction. Extensive sigmoid diverticulosis identified. No acute inflammation. Vascular/Lymphatic: Mild aortic atherosclerosis. No aneurysm. No adenopathy identified within the abdomen. No enlarged pelvic or inguinal adenopathy. Reproductive: Status post hysterectomy. No adnexal masses. Other: No free fluid or fluid collections. Musculoskeletal: Previous bilateral hip arthroplasty. Degenerative disc disease and scoliosis identified within the lumbar spine. No suspicious bone lesions. IMPRESSION: 1. No mass or adenopathy identified within the chest, abdomen or pelvis. 2. The spleen is normal in size and appearance 3. Status post left mastectomy and left axillary nodal dissection. 4. Tiny nodule in the left upper lobe measures 3 mm. No follow-up needed if patient is low-risk. Non-contrast chest CT can be considered in 12 months if patient is high-risk. This recommendation follows the consensus statement: Guidelines for Management of Incidental Pulmonary Nodules Detected on CT Images: From the Fleischner Society 2017; Radiology 2017; 284:228-243. 5.  Aortic Atherosclerosis (ICD10-I70.0). 6. There is a small indeterminate intermediate attenuating lesion arising from the right kidney. This could represent a hemorrhagic cyst or solid enhancing neoplasm. More  definitive assessment could be obtained with renal protocol MRI. Electronically Signed   By: Kerby Moors  M.D.   On: 02/19/2017 16:22   Ct Abdomen Pelvis W Contrast  Result Date: 02/19/2017 CLINICAL DATA:  Chronic lymphocytic leukemia. EXAM: CT CHEST, ABDOMEN, AND PELVIS WITH CONTRAST TECHNIQUE: Multidetector CT imaging of the chest, abdomen and pelvis was performed following the standard protocol during bolus administration of intravenous contrast. CONTRAST:  165mL ISOVUE-300 IOPAMIDOL (ISOVUE-300) INJECTION 61% COMPARISON:  None. FINDINGS: CT CHEST FINDINGS Cardiovascular: The heart size appears normal. No pericardial effusion. Calcification in the left anterior descending coronary artery noted. RCA coronary artery calcification also noted Mediastinum/Nodes: No enlarged mediastinal, hilar, or axillary lymph nodes. Thyroid gland, trachea demonstrate no significant findings. Small hiatal hernia. Lungs/Pleura: No pleural effusion. No airspace consolidation or atelectasis. Tiny nodule within the lingula measures 3 mm, image 93 of series 4. Musculoskeletal: Previous left mastectomy and left axillary nodal dissection. No aggressive lytic or sclerotic bone lesions identified. CT ABDOMEN PELVIS FINDINGS Hepatobiliary: Scattered simple appearing liver cysts noted. No suspicious liver abnormality. Previous cholecystectomy. No biliary dilatation. Pancreas: Unremarkable. No pancreatic ductal dilatation or surrounding inflammatory changes. Spleen: Normal in size without focal abnormality. Adrenals/Urinary Tract: The adrenal glands appear normal. There is a exophytic hyperdense lesion arising from the posterior cortex of the inferior pole of the right kidney measuring 1 cm and 40 HU, image 70 of series 2. Small hypodensity within the left kidney measures 7 mm and is too small to characterize. No hydronephrosis. Stomach/Bowel: No abnormal distension of the stomach. The small bowel loops have a normal course and caliber. No  bowel obstruction. Extensive sigmoid diverticulosis identified. No acute inflammation. Vascular/Lymphatic: Mild aortic atherosclerosis. No aneurysm. No adenopathy identified within the abdomen. No enlarged pelvic or inguinal adenopathy. Reproductive: Status post hysterectomy. No adnexal masses. Other: No free fluid or fluid collections. Musculoskeletal: Previous bilateral hip arthroplasty. Degenerative disc disease and scoliosis identified within the lumbar spine. No suspicious bone lesions. IMPRESSION: 1. No mass or adenopathy identified within the chest, abdomen or pelvis. 2. The spleen is normal in size and appearance 3. Status post left mastectomy and left axillary nodal dissection. 4. Tiny nodule in the left upper lobe measures 3 mm. No follow-up needed if patient is low-risk. Non-contrast chest CT can be considered in 12 months if patient is high-risk. This recommendation follows the consensus statement: Guidelines for Management of Incidental Pulmonary Nodules Detected on CT Images: From the Fleischner Society 2017; Radiology 2017; 284:228-243. 5.  Aortic Atherosclerosis (ICD10-I70.0). 6. There is a small indeterminate intermediate attenuating lesion arising from the right kidney. This could represent a hemorrhagic cyst or solid enhancing neoplasm. More definitive assessment could be obtained with renal protocol MRI. Electronically Signed   By: Kerby Moors M.D.   On: 02/19/2017 16:22    EKG: Independently reviewed.  Heart rate 97, no Q waves, no ST-T changes.  Assessment/Plan Active Problems:   ACS (acute coronary syndrome) (HCC)  Atypical chest pain Given the history of DM2, HTN and tamoxifen use, along with the patient's age, she is at least moderate risk for coronary disease. Her symptoms do not fit a typical picture and the initial troponin is negative. Anxiety is likely the culprit, however, prudent to rule out MI. Very much doubt PE given lack of hypoxia and only low level tachycardia. WBC  not elevated significantly to raise concern for leukostasis. - Delta trop - Monitor on telemetry - Defer TTE to day attending - Start ASA, Lipitor given risk factors - Will start Coreg 6.25 mg BID - Risk stratification labs  HTN - Continue losartan, start  Coreg as above - Can discontinue amlodipine per patient wishes  CLL - Pt not yet started on ibrutinib - Will need Oncology follow up as outpatient  DM2 - Hold oral hyperglycemic agents - FSBS ACHS  DVT prophylaxis: Lovenox Code Status: Full Disposition Plan: Home tomorrow Consults called: None Admission status: Tele obs  Fareedah Mahler Sharene Butters MD [facility]  If 7PM-7AM, please contact night-coverage [email or web address] Password [facility]  02/20/2017, 10:35 PM

## 2017-02-20 NOTE — ED Provider Notes (Signed)
Patient placed in Quick Look pathway, seen and evaluated   Chief Complaint:chest pain  HPI:   77 y/o female w/ 3-4 days of constant sharp chest pain worse w/ movement  No dyspnea or sweating Increased depression recently  ROS: no cough or congestion  Physical Exam:   Gen: No distress  Neuro: Awake and Alert  Skin: Warm    Focused Exam: lungs clear, hear rate   Initiation of care has begun. The patient has been counseled on the process, plan, and necessity for staying for the completion/evaluation, and the remainder of the medical screening examination   Lacretia Leigh, MD 02/20/17 1533

## 2017-02-20 NOTE — ED Triage Notes (Signed)
Patient c/o generalized constant chest pain x 3-4 days. Patient states she has had chest pain in the past, but this one feels different. Patient denies any SOB or diaphoresis. Patient states moving makes her chest pain worse. Patient also c/o diarrhea. Patient is anxious.

## 2017-02-20 NOTE — ED Provider Notes (Signed)
[facility] DEPT Provider Note   CSN: [id number] Arrival date & time: 02/20/17  1518     History   Chief Complaint Chief Complaint  Patient presents with  . Chest Pain    HPI CHERYL CHAY is a 77 y.o. female.  77 year old female with history of anxiety, CLL, breast cancer, and hypertension presents with complaint of left-sided chest pain.  Patient reports intermittent chest pain over the last 3-4 days.  Patient describes pain that is diffuse across the left anterior chest.  She denies associated shortness of breath or diaphoresis.  She denies nausea.  She reports that the pain seems to be worse with movement.  Patient reports that the pain is not reproducible with pressure on the chest wall itself.  She denies prior history of CAD.  She denies prior cardiac catheterization.  She reports that she had a normal stress test -  she thinks -sometime around 2013.   The history is provided by the patient.  Chest Pain   This is a new problem. The current episode started more than 2 days ago. The problem occurs constantly. The problem has not changed since onset.The pain is associated with exertion. The pain is present in the substernal region. The pain is mild. The quality of the pain is described as brief. The pain does not radiate. Duration of episode(s) is 3 days. The symptoms are aggravated by certain positions. Pertinent negatives include no back pain, no nausea, no shortness of breath, no vomiting and no weakness. She has tried nothing for the symptoms. The treatment provided no relief.    Past Medical History:  Diagnosis Date  . Anginal pain ([place])    09/2012, checked out and was told it was stress  . Anxiety   . Arthritis    OA BOTH HIPS, KNEES AND BACK.  SEVER PAIN IN RIGHT HIP  . Breast cancer ([place])    left mastectomy detected on mammogram  . Cancer ([place]) 1991   LEFT MASTECTOMY FOR BREAST CANCER--PT NOT SURE IF SHE IS TO AVOID NEEDLES LEFT ARM-DID  HAVE AXILLARY LYMPH NODES REMOVED  . Chest pain    HX OF CHEST PAIN --NUCLEAR STRESS TEST 04/19/11 NORMAL--PT STATES SHE WAS TOLD STRESS MAY BE THE CAUSE OF HER CHEST PAINS  . CLL (chronic lymphocytic leukemia) ([place])   . Depression   . Diabetes mellitus without complication ([place])   . Family history of anesthesia complication    PT'S SON HAD ANESTHESIA PROBLEM WITH EXCISION OF WISDOM TEETH ---THRASING AROUND--BUT DID GO HOME SAME DAY OF PROCEDURE  . GERD (gastroesophageal reflux disease)   . Heart murmur    MVP - PT THINKS HER CHEST PAIN IS RELATED TO HER MVP  . Hemorrhoid   . Hyperlipemia   . Hypertension   . IBS (irritable bowel syndrome)   . IGT (impaired glucose tolerance)   . Insomnia   . Need for vaccination against Streptococcus pneumoniae 04/04/2013  . Sleep apnea    HAS CPAP MACHINE-BUT NO LONGER USES    Patient Active Problem List   Diagnosis Date Noted  . Seborrheic keratoses 02/12/2017  . CLL (chronic lymphocytic leukemia) ([place]) 12/22/2016  . Need for vaccination against Streptococcus pneumoniae 04/04/2013  . Anxiety 02/14/2013  . Acute posthemorrhagic anemia 02/08/2013  . Arthritis pain of left hip 01/31/2013  . Depression 04/03/2012  . Insomnia 04/03/2012  . Generalized anxiety disorder 04/03/2012  . Anticoagulation adequate 04/03/2012  . Hypertension   . Sleep apnea   .  Diabetes mellitus without complication ([place])   . GERD (gastroesophageal reflux disease)   . History of breast cancer   . 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 ABDOMINAL HYSTERECTOMY    . BREAST SURGERY     LEFT MASTECTOMY AND AXILLARY DISSECTION FOR BREAST CANCER  . CHOLECYSTECTOMY    . MASTECTOMY    . TONSILLECTOMY     AS A TEENAGER  . TOTAL HIP ARTHROPLASTY Right 03/29/2012   Procedure: Right TOTAL HIP ARTHROPLASTY ANTERIOR APPROACH;  Surgeon: Mcarthur Rossetti, MD;  Location: [facility] ORS;  Service: Orthopedics;   Laterality: Right;  . TOTAL HIP ARTHROPLASTY Left 01/31/2013   Procedure: LEFT TOTAL HIP ARTHROPLASTY ANTERIOR APPROACH;  Surgeon: Mcarthur Rossetti, MD;  Location: [facility];  Service: Orthopedics;  Laterality: Left;    OB History    No data available       Home Medications    Prior to Admission medications   Medication Sig Start Date End Date Taking? Authorizing Provider  amLODipine (NORVASC) 5 MG tablet Take 5 mg by mouth daily. 02/05/17  Yes [provider]  buPROPion (WELLBUTRIN XL) 150 MG 24 hr tablet Take 150 mg by mouth daily.   Yes [provider]  Cholecalciferol (VITAMIN D PO) Take 1,000 Units by mouth daily.   Yes [provider]  Cyanocobalamin (VITAMIN B-12 PO) Take 1 tablet by mouth daily.   Yes [provider]  diclofenac (VOLTAREN) 75 MG EC tablet TAKE 1 TABLET (75 MG TOTAL) BY MOUTH 2 (TWO) TIMES DAILY. TAKE WITH FOOD 02/05/17  Yes Magnus Sinning, MD  Docusate Calcium (STOOL SOFTENER PO) Take 1 tablet by mouth daily as needed (CONSTIPATION).   Yes [provider]  fluticasone (FLONASE) 50 MCG/ACT nasal spray Place 1 spray into both nostrils daily as needed for allergies or rhinitis.   Yes [provider]  LORazepam (ATIVAN) 1 MG tablet Take 1 mg by mouth at bedtime.    Yes [provider]  losartan (COZAAR) 100 MG tablet Take 100 mg by mouth daily.    Yes [provider]  Multiple Vitamins-Calcium (ONE-A-DAY WOMENS PO) Take 1 tablet by mouth daily.   Yes [provider]  mupirocin ointment (BACTROBAN) 2 % Apply 1 application topically daily. 02/05/17  Yes [provider]  omeprazole (PRILOSEC OTC) 20 MG tablet Take 20 mg by mouth at bedtime.    Yes [provider]  zolpidem (AMBIEN) 10 MG tablet Take 15 mg by mouth at bedtime as needed for sleep.    Yes [provider]  Ibrutinib 420 MG TABS Take 420 mg by mouth daily. Take with a full glass of water at  approximately the same time daily. Maintain hydration. Patient not taking: Reported on 02/20/2017 02/13/17   Heath Lark, MD  methocarbamol (ROBAXIN) 500 MG tablet Take 1 tablet (500 mg total) by mouth every 6 (six) hours as needed for muscle spasms. Patient not taking: Reported on 02/20/2017 11/08/16   Mcarthur Rossetti, MD    Family History Family History  Problem Relation Age of Onset  . Cancer Mother        breast ca  . Breast cancer Mother   . Kidney disease Father   . Diabetes Father     Social History Social History   Tobacco Use  . Smoking status: Never Smoker  . Smokeless tobacco: Never Used  Substance Use Topics  . Alcohol use: Yes  Comment: rare glass of wine  . Drug use: No     Allergies   Ciprofloxacin and Quinolones   Review of Systems Review of Systems  Respiratory: Negative for shortness of breath.   Cardiovascular: Positive for chest pain.  Gastrointestinal: Negative for nausea and vomiting.  Musculoskeletal: Negative for back pain.  Neurological: Negative for weakness.  All other systems reviewed and are negative.    Physical Exam Updated Vital Signs BP (!) 154/86   Pulse 77   Temp 98.3 F (36.8 C) (Oral)   Resp 16   Ht 5\' 2"  (1.575 m)   Wt 80.7 kg (178 lb)   SpO2 99%   BMI 32.56 kg/m   Physical Exam  Constitutional: She is oriented to person, place, and time. She appears well-developed and well-nourished. No distress.  HENT:  Head: Normocephalic and atraumatic.  Mouth/Throat: Oropharynx is clear and moist.  Eyes: Conjunctivae and EOM are normal. Pupils are equal, round, and reactive to light.  Neck: Normal range of motion. Neck supple.  Cardiovascular: Normal rate, regular rhythm and normal heart sounds.  Pain is not reproduced with palpation of the anterior chest wall  Pulmonary/Chest: Effort normal and breath sounds normal. No respiratory distress.  Abdominal: Soft. She exhibits no distension. There is no tenderness.    Musculoskeletal: Normal range of motion. She exhibits no edema or deformity.  Neurological: She is alert and oriented to person, place, and time.  Skin: Skin is warm and dry.  Psychiatric: She has a normal mood and affect.  Nursing note and vitals reviewed.    ED Treatments / Results  Labs (all labs ordered are listed, but only abnormal results are displayed) Labs Reviewed  CBC WITH DIFFERENTIAL/PLATELET - Abnormal; Notable for the following components:      Result Value   WBC 58.1 (*)    Lymphs Abs 51.7 (*)    Monocytes Absolute 1.2 (*)    All other components within normal limits  COMPREHENSIVE METABOLIC PANEL - Abnormal; Notable for the following components:   Total Bilirubin 0.2 (*)    All other components within normal limits  TROPONIN I  PATHOLOGIST SMEAR REVIEW    EKG  EKG Interpretation  Date/Time:  Tuesday February 20 2017 15:27:44 EST Ventricular Rate:  98 PR Interval:    QRS Duration: 97 QT Interval:  344 QTC Calculation: 440 R Axis:   34 Text Interpretation:  Sinus rhythm Probable left atrial enlargement Baseline wander in lead(s) II Confirmed by Dene Gentry [phone]) on 02/20/2017 5:16:10 PM       Radiology Dg Chest 2 View  Result Date: 02/20/2017 CLINICAL DATA:  Left side chest pain x 3-4 days. States that she's under a lot of stress. Pt states that she started to feel pain in her left foot today. Hx diabetes, htn EXAM: CHEST  2 VIEW COMPARISON:  Chest CT, 02/19/2017.  Chest radiographs, 01/27/2013. FINDINGS: Cardiac silhouette is normal in size and configuration. No mediastinal or hilar masses. No convincing adenopathy. Clear lungs.  No pleural effusion or pneumothorax. Stable changes from a left mastectomy. Skeletal structures are intact. IMPRESSION: No active cardiopulmonary disease. Electronically Signed   By: Lajean Manes M.D.   On: 02/20/2017 15:56   Ct Chest W Contrast  Result Date: 02/19/2017 CLINICAL DATA:  Chronic lymphocytic leukemia. EXAM: CT  CHEST, ABDOMEN, AND PELVIS WITH CONTRAST TECHNIQUE: Multidetector CT imaging of the chest, abdomen and pelvis was performed following the standard protocol during bolus administration of intravenous contrast. CONTRAST:  173mL ISOVUE-300  IOPAMIDOL (ISOVUE-300) INJECTION 61% COMPARISON:  None. FINDINGS: CT CHEST FINDINGS Cardiovascular: The heart size appears normal. No pericardial effusion. Calcification in the left anterior descending coronary artery noted. RCA coronary artery calcification also noted Mediastinum/Nodes: No enlarged mediastinal, hilar, or axillary lymph nodes. Thyroid gland, trachea demonstrate no significant findings. Small hiatal hernia. Lungs/Pleura: No pleural effusion. No airspace consolidation or atelectasis. Tiny nodule within the lingula measures 3 mm, image 93 of series 4. Musculoskeletal: Previous left mastectomy and left axillary nodal dissection. No aggressive lytic or sclerotic bone lesions identified. CT ABDOMEN PELVIS FINDINGS Hepatobiliary: Scattered simple appearing liver cysts noted. No suspicious liver abnormality. Previous cholecystectomy. No biliary dilatation. Pancreas: Unremarkable. No pancreatic ductal dilatation or surrounding inflammatory changes. Spleen: Normal in size without focal abnormality. Adrenals/Urinary Tract: The adrenal glands appear normal. There is a exophytic hyperdense lesion arising from the posterior cortex of the inferior pole of the right kidney measuring 1 cm and 40 HU, image 70 of series 2. Small hypodensity within the left kidney measures 7 mm and is too small to characterize. No hydronephrosis. Stomach/Bowel: No abnormal distension of the stomach. The small bowel loops have a normal course and caliber. No bowel obstruction. Extensive sigmoid diverticulosis identified. No acute inflammation. Vascular/Lymphatic: Mild aortic atherosclerosis. No aneurysm. No adenopathy identified within the abdomen. No enlarged pelvic or inguinal adenopathy. Reproductive:  Status post hysterectomy. No adnexal masses. Other: No free fluid or fluid collections. Musculoskeletal: Previous bilateral hip arthroplasty. Degenerative disc disease and scoliosis identified within the lumbar spine. No suspicious bone lesions. IMPRESSION: 1. No mass or adenopathy identified within the chest, abdomen or pelvis. 2. The spleen is normal in size and appearance 3. Status post left mastectomy and left axillary nodal dissection. 4. Tiny nodule in the left upper lobe measures 3 mm. No follow-up needed if patient is low-risk. Non-contrast chest CT can be considered in 12 months if patient is high-risk. This recommendation follows the consensus statement: Guidelines for Management of Incidental Pulmonary Nodules Detected on CT Images: From the Fleischner Society 2017; Radiology 2017; 284:228-243. 5.  Aortic Atherosclerosis (ICD10-I70.0). 6. There is a small indeterminate intermediate attenuating lesion arising from the right kidney. This could represent a hemorrhagic cyst or solid enhancing neoplasm. More definitive assessment could be obtained with renal protocol MRI. Electronically Signed   By: Kerby Moors M.D.   On: 02/19/2017 16:22   Ct Abdomen Pelvis W Contrast  Result Date: 02/19/2017 CLINICAL DATA:  Chronic lymphocytic leukemia. EXAM: CT CHEST, ABDOMEN, AND PELVIS WITH CONTRAST TECHNIQUE: Multidetector CT imaging of the chest, abdomen and pelvis was performed following the standard protocol during bolus administration of intravenous contrast. CONTRAST:  143mL ISOVUE-300 IOPAMIDOL (ISOVUE-300) INJECTION 61% COMPARISON:  None. FINDINGS: CT CHEST FINDINGS Cardiovascular: The heart size appears normal. No pericardial effusion. Calcification in the left anterior descending coronary artery noted. RCA coronary artery calcification also noted Mediastinum/Nodes: No enlarged mediastinal, hilar, or axillary lymph nodes. Thyroid gland, trachea demonstrate no significant findings. Small hiatal hernia.  Lungs/Pleura: No pleural effusion. No airspace consolidation or atelectasis. Tiny nodule within the lingula measures 3 mm, image 93 of series 4. Musculoskeletal: Previous left mastectomy and left axillary nodal dissection. No aggressive lytic or sclerotic bone lesions identified. CT ABDOMEN PELVIS FINDINGS Hepatobiliary: Scattered simple appearing liver cysts noted. No suspicious liver abnormality. Previous cholecystectomy. No biliary dilatation. Pancreas: Unremarkable. No pancreatic ductal dilatation or surrounding inflammatory changes. Spleen: Normal in size without focal abnormality. Adrenals/Urinary Tract: The adrenal glands appear normal. There is a exophytic hyperdense lesion arising  from the posterior cortex of the inferior pole of the right kidney measuring 1 cm and 40 HU, image 70 of series 2. Small hypodensity within the left kidney measures 7 mm and is too small to characterize. No hydronephrosis. Stomach/Bowel: No abnormal distension of the stomach. The small bowel loops have a normal course and caliber. No bowel obstruction. Extensive sigmoid diverticulosis identified. No acute inflammation. Vascular/Lymphatic: Mild aortic atherosclerosis. No aneurysm. No adenopathy identified within the abdomen. No enlarged pelvic or inguinal adenopathy. Reproductive: Status post hysterectomy. No adnexal masses. Other: No free fluid or fluid collections. Musculoskeletal: Previous bilateral hip arthroplasty. Degenerative disc disease and scoliosis identified within the lumbar spine. No suspicious bone lesions. IMPRESSION: 1. No mass or adenopathy identified within the chest, abdomen or pelvis. 2. The spleen is normal in size and appearance 3. Status post left mastectomy and left axillary nodal dissection. 4. Tiny nodule in the left upper lobe measures 3 mm. No follow-up needed if patient is low-risk. Non-contrast chest CT can be considered in 12 months if patient is high-risk. This recommendation follows the consensus  statement: Guidelines for Management of Incidental Pulmonary Nodules Detected on CT Images: From the Fleischner Society 2017; Radiology 2017; 284:228-243. 5.  Aortic Atherosclerosis (ICD10-I70.0). 6. There is a small indeterminate intermediate attenuating lesion arising from the right kidney. This could represent a hemorrhagic cyst or solid enhancing neoplasm. More definitive assessment could be obtained with renal protocol MRI. Electronically Signed   By: Kerby Moors M.D.   On: 02/19/2017 16:22    Procedures Procedures (including critical care time)  Medications Ordered in ED Medications  0.9 %  sodium chloride infusion ( Intravenous New Bag/Given 02/20/17 1748)  aspirin chewable tablet 324 mg (324 mg Oral Given 02/20/17 1752)     Initial Impression / Assessment and Plan / ED Course  I have reviewed the triage vital signs and the nursing notes.  Pertinent labs & imaging results that were available during my care of the patient were reviewed by me and considered in my medical decision making (see chart for details).     MDM  Screen Complete  Patient is presenting with somewhat atypical chest pain.  Patient denies recent cardiac workup.  Initial EKG and troponin are reassuring however, plan to admit for further workup and observation.  1900 Hospitalist service paged for admission.  [facility] service aware of case and will evaluate for admission.   Final Clinical Impressions(s) / ED Diagnoses   Final diagnoses:  Chest pain    ED Discharge Orders    None       Valarie Merino, MD 02/20/17 1905

## 2017-02-20 NOTE — ED Notes (Signed)
Bed: WA03 Expected date:  Expected time:  Means of arrival:  Comments: Hold for triage  

## 2017-02-21 ENCOUNTER — Observation Stay (HOSPITAL_BASED_OUTPATIENT_CLINIC_OR_DEPARTMENT_OTHER): Payer: Medicare HMO

## 2017-02-21 DIAGNOSIS — I10 Essential (primary) hypertension: Secondary | ICD-10-CM | POA: Diagnosis not present

## 2017-02-21 DIAGNOSIS — R0789 Other chest pain: Secondary | ICD-10-CM | POA: Diagnosis not present

## 2017-02-21 DIAGNOSIS — F411 Generalized anxiety disorder: Secondary | ICD-10-CM

## 2017-02-21 DIAGNOSIS — R079 Chest pain, unspecified: Secondary | ICD-10-CM

## 2017-02-21 LAB — BASIC METABOLIC PANEL
Anion gap: 6 (ref 5–15)
BUN: 20 mg/dL (ref 6–20)
CHLORIDE: 107 mmol/L (ref 101–111)
CO2: 26 mmol/L (ref 22–32)
Calcium: 9.1 mg/dL (ref 8.9–10.3)
Creatinine, Ser: 0.75 mg/dL (ref 0.44–1.00)
GFR calc non Af Amer: >60 mL/min (ref >60)
GLUCOSE: 100 mg/dL — AB (ref 65–99)
Potassium: 4.3 mmol/L (ref 3.5–5.1)
Sodium: 139 mmol/L (ref 135–145)

## 2017-02-21 LAB — GLUCOSE, CAPILLARY
Glucose-Capillary: 99 mg/dL (ref 65–99)
Glucose-Capillary: 100 mg/dL — ABNORMAL HIGH (ref 65–99)
Glucose-Capillary: 90 mg/dL (ref 65–99)

## 2017-02-21 LAB — CBC
HEMATOCRIT: 37.7 % (ref 36.0–46.0)
HEMOGLOBIN: 12.4 g/dL (ref 12.0–15.0)
MCH: 30.7 pg (ref 26.0–34.0)
MCHC: 32.9 g/dL (ref 30.0–36.0)
MCV: 93.3 fL (ref 78.0–100.0)
Platelets: 190 10*3/uL (ref 150–400)
RBC: 4.04 MIL/uL (ref 3.87–5.11)
RDW: 14.7 % (ref 11.5–15.5)
WBC: 73 10*3/uL (ref 4.0–10.5)

## 2017-02-21 LAB — TROPONIN I

## 2017-02-21 LAB — ECHOCARDIOGRAM COMPLETE
HEIGHTINCHES: 62 in
Weight: 2848 oz

## 2017-02-21 MED ORDER — NITROGLYCERIN 0.4 MG SL SUBL: 0.4000 mg | SUBLINGUAL_TABLET | SUBLINGUAL | 0 refills | Status: AC | PRN

## 2017-02-21 MED ORDER — ASPIRIN 81 MG PO TBEC: 81.0000 mg | DELAYED_RELEASE_TABLET | Freq: Every day | ORAL | 0 refills | Status: AC

## 2017-02-21 MED ORDER — CARVEDILOL 6.25 MG PO TABS: 6.2500 mg | ORAL_TABLET | Freq: Two times a day (BID) | ORAL | 0 refills | Status: AC

## 2017-02-21 MED ORDER — TRAMADOL HCL 50 MG PO TABS
50.0000 mg | ORAL_TABLET | Freq: Four times a day (QID) | ORAL | Status: DC | PRN
  Administered 2017-02-21: 50 mg via ORAL
  Filled 2017-02-21: qty 1

## 2017-02-21 MED ORDER — KETOROLAC TROMETHAMINE 15 MG/ML IJ SOLN: 15.0000 mg | Freq: Four times a day (QID) | INTRAMUSCULAR | Status: DC | PRN

## 2017-02-21 MED ORDER — KETOROLAC TROMETHAMINE 15 MG/ML IJ SOLN
15.0000 mg | INTRAMUSCULAR | Status: AC
  Administered 2017-02-21: 15 mg via INTRAVENOUS
  Filled 2017-02-21: qty 1

## 2017-02-21 MED ORDER — KETOROLAC TROMETHAMINE 10 MG PO TABS: 10.0000 mg | ORAL_TABLET | Freq: Four times a day (QID) | ORAL | 0 refills | Status: AC | PRN

## 2017-02-21 NOTE — Progress Notes (Signed)
Pt discharged to home instructions reviewed, acknowledge understanding. SRP, RN

## 2017-02-21 NOTE — Progress Notes (Signed)
Pt refused IV Toradol for pain and request PO pain med instead, MD updated; orders noted and verbal received. SRP, RN

## 2017-02-21 NOTE — Progress Notes (Signed)
  Echocardiogram 2D Echocardiogram has been performed.  Merrie Roof F 02/21/2017, 3:16 PM

## 2017-02-21 NOTE — Discharge Summary (Addendum)
Physician Discharge Summary  Rebecca Lowery QAS:[id number] DOB: 17-Apr-1940 DOA: 02/20/2017  PCP: Prince Solian, MD  Admit date: 02/20/2017 Discharge date: 02/21/2017  Admitted From: Home Disposition:  Home  Recommendations for Outpatient Follow-up:  1. Follow up with PCP in 1-2 weeks 2. Follow up with Oncology as scheduled 3. Recommend outpatient follow up with Cardiology. Consider outpatient stress test 4. Please note, patient wishes to wean off ambien. Will defer to PCP/primary psychiatrist  Discharge Condition:Stable CODE STATUS:Full Diet recommendation: Heart healthy   Brief/Interim Summary: Dictated H&P details.  Briefly patient is a very pleasant 77 year old female with history of Be[place]ies, hypertension, anxiety, CLL currently undergoing treatment who presented to the hospital with complaints of chest discomfort without diaphoresis nausea or emesis.  Patient was placed on observation status for further workup.  Atypical chest pain -Patient has been ruled out by cardiac enzymes x4 -2D echocardiogram was obtained with findings of normal LVEF and no wall motion abnormality. -During his hospital course, patient was visibly anxious and tearful -Patient's family at bedside who reports patient has been undergoing large amounts of stress and is concerned that patient's chest pain is likely attributed to the above-mentioned anxiety. -Recommend referral for outpatient stress testing to complete workup  HTN - Continue losartan, started Coreg as above -Have discontinued amlodipine given ill effects per patient (patient reports diarrhea)  CLL - Pt not yet started on ibrutinib -Patient scheduled to follow-up with oncology in 02/22/2017.  DM2 - Hold oral hyperglycemic agents while in hospital - [facility] course    Discharge Diagnoses:  Principal Problem:   Chest pain Active Problems:   Hypertension   Sleep apnea   Diabetes mellitus without complication (HCC)    Insomnia   Generalized anxiety disorder   CLL (chronic lymphocytic leukemia) ([place])    Discharge Instructions   Allergies as of 02/21/2017      Reactions   Ciprofloxacin Hives   REACTION UNKNOWN   Quinolones Hives      Medication List    STOP taking these medications   amLODipine 5 MG tablet Commonly known as:  NORVASC     TAKE these medications   aspirin 81 MG EC tablet Take 1 tablet (81 mg total) by mouth daily. Start taking on:  02/22/2017   buPROPion 150 MG 24 hr tablet Commonly known as:  WELLBUTRIN XL Take 150 mg by mouth daily.   carvedilol 6.25 MG tablet Commonly known as:  COREG Take 1 tablet (6.25 mg total) by mouth 2 (two) times daily with a meal.   diclofenac 75 MG EC tablet Commonly known as:  VOLTAREN TAKE 1 TABLET (75 MG TOTAL) BY MOUTH 2 (TWO) TIMES DAILY. TAKE WITH FOOD   FLONASE 50 MCG/ACT nasal spray Generic drug:  fluticasone Place 1 spray into both nostrils daily as needed for allergies or rhinitis.   Ibrutinib 420 MG Tabs Take 420 mg by mouth daily. Take with a full glass of water at approximately the same time daily. Maintain hydration.   ketorolac 10 MG tablet Commonly known as:  TORADOL Take 1 tablet (10 mg total) by mouth every 6 (six) hours as needed.   LORazepam 1 MG tablet Commonly known as:  ATIVAN Take 1 mg by mouth at bedtime.   losartan 100 MG tablet Commonly known as:  COZAAR Take 100 mg by mouth daily.   methocarbamol 500 MG tablet Commonly known as:  ROBAXIN Take 1 tablet (500 mg total) by mouth every 6 (six) hours as needed for muscle  spasms.   mupirocin ointment 2 % Commonly known as:  BACTROBAN Apply 1 application topically daily.   nitroGLYCERIN 0.4 MG SL tablet Commonly known as:  NITROSTAT Place 1 tablet (0.4 mg total) under the tongue every 5 (five) minutes as needed for chest pain.   omeprazole 20 MG tablet Commonly known as:  PRILOSEC OTC Take 20 mg by mouth at bedtime.   ONE-A-DAY WOMENS PO Take 1  tablet by mouth daily.   STOOL SOFTENER PO Take 1 tablet by mouth daily as needed (CONSTIPATION).   VITAMIN B-12 PO Take 1 tablet by mouth daily.   VITAMIN D PO Take 1,000 Units by mouth daily.   zolpidem 10 MG tablet Commonly known as:  AMBIEN Take 15 mg by mouth at bedtime as needed for sleep.       Allergies  Allergen Reactions  . Ciprofloxacin Hives    REACTION UNKNOWN  . Quinolones Hives     Procedures/Studies: Dg Chest 2 View  Result Date: 02/20/2017 CLINICAL DATA:  Left side chest pain x 3-4 days. States that she's under a lot of stress. Pt states that she started to feel pain in her left foot today. Hx diabetes, htn EXAM: CHEST  2 VIEW COMPARISON:  Chest CT, 02/19/2017.  Chest radiographs, 01/27/2013. FINDINGS: Cardiac silhouette is normal in size and configuration. No mediastinal or hilar masses. No convincing adenopathy. Clear lungs.  No pleural effusion or pneumothorax. Stable changes from a left mastectomy. Skeletal structures are intact. IMPRESSION: No active cardiopulmonary disease. Electronically Signed   By: Lajean Manes M.D.   On: 02/20/2017 15:56   Ct Chest W Contrast  Result Date: 02/19/2017 CLINICAL DATA:  Chronic lymphocytic leukemia. EXAM: CT CHEST, ABDOMEN, AND PELVIS WITH CONTRAST TECHNIQUE: Multidetector CT imaging of the chest, abdomen and pelvis was performed following the standard protocol during bolus administration of intravenous contrast. CONTRAST:  121mL ISOVUE-300 IOPAMIDOL (ISOVUE-300) INJECTION 61% COMPARISON:  None. FINDINGS: CT CHEST FINDINGS Cardiovascular: The heart size appears normal. No pericardial effusion. Calcification in the left anterior descending coronary artery noted. RCA coronary artery calcification also noted Mediastinum/Nodes: No enlarged mediastinal, hilar, or axillary lymph nodes. Thyroid gland, trachea demonstrate no significant findings. Small hiatal hernia. Lungs/Pleura: No pleural effusion. No airspace consolidation or  atelectasis. Tiny nodule within the lingula measures 3 mm, image 93 of series 4. Musculoskeletal: Previous left mastectomy and left axillary nodal dissection. No aggressive lytic or sclerotic bone lesions identified. CT ABDOMEN PELVIS FINDINGS Hepatobiliary: Scattered simple appearing liver cysts noted. No suspicious liver abnormality. Previous cholecystectomy. No biliary dilatation. Pancreas: Unremarkable. No pancreatic ductal dilatation or surrounding inflammatory changes. Spleen: Normal in size without focal abnormality. Adrenals/Urinary Tract: The adrenal glands appear normal. There is a exophytic hyperdense lesion arising from the posterior cortex of the inferior pole of the right kidney measuring 1 cm and 40 HU, image 70 of series 2. Small hypodensity within the left kidney measures 7 mm and is too small to characterize. No hydronephrosis. Stomach/Bowel: No abnormal distension of the stomach. The small bowel loops have a normal course and caliber. No bowel obstruction. Extensive sigmoid diverticulosis identified. No acute inflammation. Vascular/Lymphatic: Mild aortic atherosclerosis. No aneurysm. No adenopathy identified within the abdomen. No enlarged pelvic or inguinal adenopathy. Reproductive: Status post hysterectomy. No adnexal masses. Other: No free fluid or fluid collections. Musculoskeletal: Previous bilateral hip arthroplasty. Degenerative disc disease and scoliosis identified within the lumbar spine. No suspicious bone lesions. IMPRESSION: 1. No mass or adenopathy identified within the chest, abdomen or pelvis.  2. The spleen is normal in size and appearance 3. Status post left mastectomy and left axillary nodal dissection. 4. Tiny nodule in the left upper lobe measures 3 mm. No follow-up needed if patient is low-risk. Non-contrast chest CT can be considered in 12 months if patient is high-risk. This recommendation follows the consensus statement: Guidelines for Management of Incidental Pulmonary  Nodules Detected on CT Images: From the Fleischner Society 2017; Radiology 2017; 284:228-243. 5.  Aortic Atherosclerosis (ICD10-I70.0). 6. There is a small indeterminate intermediate attenuating lesion arising from the right kidney. This could represent a hemorrhagic cyst or solid enhancing neoplasm. More definitive assessment could be obtained with renal protocol MRI. Electronically Signed   By: Kerby Moors M.D.   On: 02/19/2017 16:22   Ct Abdomen Pelvis W Contrast  Result Date: 02/19/2017 CLINICAL DATA:  Chronic lymphocytic leukemia. EXAM: CT CHEST, ABDOMEN, AND PELVIS WITH CONTRAST TECHNIQUE: Multidetector CT imaging of the chest, abdomen and pelvis was performed following the standard protocol during bolus administration of intravenous contrast. CONTRAST:  126mL ISOVUE-300 IOPAMIDOL (ISOVUE-300) INJECTION 61% COMPARISON:  None. FINDINGS: CT CHEST FINDINGS Cardiovascular: The heart size appears normal. No pericardial effusion. Calcification in the left anterior descending coronary artery noted. RCA coronary artery calcification also noted Mediastinum/Nodes: No enlarged mediastinal, hilar, or axillary lymph nodes. Thyroid gland, trachea demonstrate no significant findings. Small hiatal hernia. Lungs/Pleura: No pleural effusion. No airspace consolidation or atelectasis. Tiny nodule within the lingula measures 3 mm, image 93 of series 4. Musculoskeletal: Previous left mastectomy and left axillary nodal dissection. No aggressive lytic or sclerotic bone lesions identified. CT ABDOMEN PELVIS FINDINGS Hepatobiliary: Scattered simple appearing liver cysts noted. No suspicious liver abnormality. Previous cholecystectomy. No biliary dilatation. Pancreas: Unremarkable. No pancreatic ductal dilatation or surrounding inflammatory changes. Spleen: Normal in size without focal abnormality. Adrenals/Urinary Tract: The adrenal glands appear normal. There is a exophytic hyperdense lesion arising from the posterior cortex of  the inferior pole of the right kidney measuring 1 cm and 40 HU, image 70 of series 2. Small hypodensity within the left kidney measures 7 mm and is too small to characterize. No hydronephrosis. Stomach/Bowel: No abnormal distension of the stomach. The small bowel loops have a normal course and caliber. No bowel obstruction. Extensive sigmoid diverticulosis identified. No acute inflammation. Vascular/Lymphatic: Mild aortic atherosclerosis. No aneurysm. No adenopathy identified within the abdomen. No enlarged pelvic or inguinal adenopathy. Reproductive: Status post hysterectomy. No adnexal masses. Other: No free fluid or fluid collections. Musculoskeletal: Previous bilateral hip arthroplasty. Degenerative disc disease and scoliosis identified within the lumbar spine. No suspicious bone lesions. IMPRESSION: 1. No mass or adenopathy identified within the chest, abdomen or pelvis. 2. The spleen is normal in size and appearance 3. Status post left mastectomy and left axillary nodal dissection. 4. Tiny nodule in the left upper lobe measures 3 mm. No follow-up needed if patient is low-risk. Non-contrast chest CT can be considered in 12 months if patient is high-risk. This recommendation follows the consensus statement: Guidelines for Management of Incidental Pulmonary Nodules Detected on CT Images: From the Fleischner Society 2017; Radiology 2017; 284:228-243. 5.  Aortic Atherosclerosis (ICD10-I70.0). 6. There is a small indeterminate intermediate attenuating lesion arising from the right kidney. This could represent a hemorrhagic cyst or solid enhancing neoplasm. More definitive assessment could be obtained with renal protocol MRI. Electronically Signed   By: Kerby Moors M.D.   On: 02/19/2017 16:22    Subjective: Eager to go home  Discharge Exam: Vitals:   02/21/17 9485  02/21/17 1537  BP: 140/71 131/63  Pulse: 70 71  Resp: 16 16  Temp: (!) 97.5 F (36.4 C) 97.9 F (36.6 C)  SpO2: 96% 96%   Vitals:    02/20/17 1946 02/20/17 2011 02/21/17 0521 02/21/17 1537  BP: 138/70 (!) 174/81 140/71 131/63  Pulse: 85 86 70 71  Resp: 18 20 16 16   Temp:  97.6 F (36.4 C) (!) 97.5 F (36.4 C) 97.9 F (36.6 C)  TempSrc:  Oral Oral Oral  SpO2: 99% 97% 96% 96%  Weight:      Height:        General: Pt is alert, awake, not in acute distress Cardiovascular: RRR, S1/S2 +, no rubs, no gallops Respiratory: CTA bilaterally, no wheezing, no rhonchi Abdominal: Soft, NT, ND, bowel sounds + Extremities: no edema, no cyanosis   The results of significant diagnostics from this hospitalization (including imaging, microbiology, ancillary and laboratory) are listed below for reference.     Microbiology: No results found for this or any previous visit (from the past 240 hour(s)).   Labs: BNP (last 3 results) No results for input(s): BNP in the last 8760 hours. Basic Metabolic Panel: Recent Labs  Lab 02/20/17 1611 02/20/17 2055 02/21/17 0239  NA 138  --  139  K 4.5  --  4.3  CL 104  --  107  CO2 26  --  26  GLUCOSE 90  --  100*  BUN 18  --  20  CREATININE 0.86  --  0.75  CALCIUM 9.5  --  9.1  MG  --  2.4  --    Liver Function Tests: Recent Labs  Lab 02/20/17 1611  AST 23  ALT 22  ALKPHOS 87  BILITOT 0.2*  PROT 6.9  ALBUMIN 4.2   No results for input(s): LIPASE, AMYLASE in the last 168 hours. No results for input(s): AMMONIA in the last 168 hours. CBC: Recent Labs  Lab 02/20/17 1611 02/21/17 0239  WBC 58.1* 73.0*  NEUTROABS 4.6  --   HGB 13.4 12.4  HCT 40.4 37.7  MCV 93.7 93.3  PLT 232 190   Cardiac Enzymes: Recent Labs  Lab 02/20/17 1611 02/20/17 2055 02/20/17 2311 02/21/17 0239  TROPONINI <0.03 <0.03 <0.03 <0.03   BNP: Invalid input(s): POCBNP CBG: Recent Labs  Lab 02/20/17 2322 02/21/17 0849 02/21/17 1206 02/21/17 1658  GLUCAP 94 99 100* 90   D-Dimer No results for input(s): DDIMER in the last 72 hours. Hgb A1c Recent Labs    02/20/17 1626  HGBA1C 5.9*    Lipid Profile Recent Labs    02/20/17 1626  CHOL 208*  HDL 60  LDLCALC 116*  TRIG 160*  CHOLHDL 3.5   Thyroid function studies Recent Labs    02/20/17 1626  TSH 1.470   Anemia work up No results for input(s): VITAMINB12, FOLATE, FERRITIN, TIBC, IRON, RETICCTPCT in the last 72 hours. Urinalysis    Component Value Date/Time   COLORURINE YELLOW 01/27/2013 1345   APPEARANCEUR CLOUDY (A) 01/27/2013 1345   LABSPEC 1.015 01/27/2013 1345   PHURINE 7.0 01/27/2013 1345   GLUCOSEU NEGATIVE 01/27/2013 1345   HGBUR TRACE (A) 01/27/2013 1345   BILIRUBINUR NEGATIVE 01/27/2013 1345   KETONESUR NEGATIVE 01/27/2013 1345   PROTEINUR NEGATIVE 01/27/2013 1345   UROBILINOGEN 1.0 01/27/2013 1345   NITRITE NEGATIVE 01/27/2013 1345   LEUKOCYTESUR TRACE (A) 01/27/2013 1345   Sepsis Labs Invalid input(s): PROCALCITONIN,  WBC,  LACTICIDVEN Microbiology No results found for this or any previous visit (  from the past 240 hour(s)).   SIGNED:   Marylu Lund, MD  [facility] 02/21/2017, 5:36 PM  If 7PM-7AM, please contact night-coverage [email or web address] Password [email or web address]

## 2017-02-21 NOTE — Care Management Obs Status (Signed)
[facility] NOTIFICATION   Patient Details  Name: Rebecca Lowery MRN: [id number] Date of Birth: [birth year]   Medicare Observation Status Notification Given:  Yes    Lynnell Catalan, RN 02/21/2017, 3:15 PM

## 2017-02-22 ENCOUNTER — Inpatient Hospital Stay (HOSPITAL_BASED_OUTPATIENT_CLINIC_OR_DEPARTMENT_OTHER): Payer: Medicare HMO | Admitting: Hematology and Oncology

## 2017-02-22 ENCOUNTER — Telehealth: Payer: Self-pay | Admitting: Hematology and Oncology

## 2017-02-22 ENCOUNTER — Encounter: Payer: Self-pay | Admitting: Hematology and Oncology

## 2017-02-22 ENCOUNTER — Telehealth: Payer: Self-pay

## 2017-02-22 DIAGNOSIS — C9110 Chronic lymphocytic leukemia of B-cell type not having achieved remission: Secondary | ICD-10-CM | POA: Diagnosis not present

## 2017-02-22 DIAGNOSIS — Z9012 Acquired absence of left breast and nipple: Secondary | ICD-10-CM | POA: Diagnosis not present

## 2017-02-22 DIAGNOSIS — K589 Irritable bowel syndrome without diarrhea: Secondary | ICD-10-CM | POA: Diagnosis not present

## 2017-02-22 DIAGNOSIS — I10 Essential (primary) hypertension: Secondary | ICD-10-CM | POA: Diagnosis not present

## 2017-02-22 DIAGNOSIS — Z79899 Other long term (current) drug therapy: Secondary | ICD-10-CM

## 2017-02-22 DIAGNOSIS — Z853 Personal history of malignant neoplasm of breast: Secondary | ICD-10-CM

## 2017-02-22 DIAGNOSIS — N2889 Other specified disorders of kidney and ureter: Secondary | ICD-10-CM | POA: Diagnosis not present

## 2017-02-22 DIAGNOSIS — E119 Type 2 diabetes mellitus without complications: Secondary | ICD-10-CM | POA: Diagnosis not present

## 2017-02-22 DIAGNOSIS — E785 Hyperlipidemia, unspecified: Secondary | ICD-10-CM | POA: Diagnosis not present

## 2017-02-22 NOTE — Progress Notes (Signed)
[place] OFFICE PROGRESS NOTE  Patient Care Team: Prince Solian, MD as PCP - General (Internal Medicine)  SUMMARY OF ONCOLOGIC HISTORY:   CLL (chronic lymphocytic leukemia) ([place])   02/12/2017 Pathology Results    FISH is positive for trisomy 12      02/12/2017 Pathology Results    Flow cytometry: The overall findings favor chronic lymphocytic leukemia      02/19/2017 Imaging    1. No mass or adenopathy identified within the chest, abdomen or pelvis. 2. The spleen is normal in size and appearance 3. Status post left mastectomy and left axillary nodal dissection. 4. Tiny nodule in the left upper lobe measures 3 mm. No follow-up needed if patient is low-risk. Non-contrast chest CT can be considered in 12 months if patient is high-risk.  5. Aortic Atherosclerosis (ICD10-I70.0). 6. There is a small indeterminate intermediate attenuating lesion arising from the right kidney. This could represent a hemorrhagic cyst or solid enhancing neoplasm. More definitive assessment could be obtained with renal protocol MRI.       INTERVAL HISTORY: Please see below for problem oriented charting. She returns for further follow-up and evaluation She was hospitalized 2 days ago for atypical chest pain Echocardiogram was within normal limits She has some mild symptoms of fatigue.  REVIEW OF SYSTEMS:   Constitutional: Denies fevers, chills or abnormal weight loss Eyes: Denies blurriness of vision Ears, nose, mouth, throat, and face: Denies mucositis or sore throat Respiratory: Denies cough, dyspnea or wheezes Cardiovascular: Denies palpitation, chest discomfort or lower extremity swelling Gastrointestinal:  Denies nausea, heartburn or change in bowel habits Skin: Denies abnormal skin rashes Lymphatics: Denies new lymphadenopathy or easy bruising Neurological:Denies numbness, tingling or new weaknesses Behavioral/Psych: Mood is stable, no new changes  All other systems were  reviewed with the patient and are negative.  I have reviewed the past medical history, past surgical history, social history and family history with the patient and they are unchanged from previous note.  ALLERGIES:  is allergic to ciprofloxacin and quinolones.  MEDICATIONS:  Current Outpatient Medications  Medication Sig Dispense Refill  . aspirin EC 81 MG EC tablet Take 1 tablet (81 mg total) by mouth daily. 30 tablet 0  . buPROPion (WELLBUTRIN XL) 150 MG 24 hr tablet Take 150 mg by mouth daily.    . carvedilol (COREG) 6.25 MG tablet Take 1 tablet (6.25 mg total) by mouth 2 (two) times daily with a meal. 60 tablet 0  . Cholecalciferol (VITAMIN D PO) Take 1,000 Units by mouth daily.    . Cyanocobalamin (VITAMIN B-12 PO) Take 1 tablet by mouth daily.    . diclofenac (VOLTAREN) 75 MG EC tablet TAKE 1 TABLET (75 MG TOTAL) BY MOUTH 2 (TWO) TIMES DAILY. TAKE WITH FOOD 60 tablet 0  . Docusate Calcium (STOOL SOFTENER PO) Take 1 tablet by mouth daily as needed (CONSTIPATION).    . fluticasone (FLONASE) 50 MCG/ACT nasal spray Place 1 spray into both nostrils daily as needed for allergies or rhinitis.    Marland Kitchen ketorolac (TORADOL) 10 MG tablet Take 1 tablet (10 mg total) by mouth every 6 (six) hours as needed. 20 tablet 0  . LORazepam (ATIVAN) 1 MG tablet Take 1 mg by mouth at bedtime.     Marland Kitchen losartan (COZAAR) 100 MG tablet Take 100 mg by mouth daily.     . methocarbamol (ROBAXIN) 500 MG tablet Take 1 tablet (500 mg total) by mouth every 6 (six) hours as needed for muscle spasms. (Patient  not taking: Reported on 02/20/2017) 60 tablet 0  . Multiple Vitamins-Calcium (ONE-A-DAY WOMENS PO) Take 1 tablet by mouth daily.    . mupirocin ointment (BACTROBAN) 2 % Apply 1 application topically daily.    . nitroGLYCERIN (NITROSTAT) 0.4 MG SL tablet Place 1 tablet (0.4 mg total) under the tongue every 5 (five) minutes as needed for chest pain. 100 tablet 0  . omeprazole (PRILOSEC OTC) 20 MG tablet Take 20 mg by mouth at  bedtime.     Marland Kitchen zolpidem (AMBIEN) 10 MG tablet Take 15 mg by mouth at bedtime as needed for sleep.      No current facility-administered medications for this visit.     PHYSICAL EXAMINATION: ECOG PERFORMANCE STATUS: 1 - Symptomatic but completely ambulatory  Vitals:   02/22/17 1210  BP: (!) 141/77  Pulse: 75  Resp: 18  Temp: 98.3 F (36.8 C)  SpO2: 97%   Filed Weights   02/22/17 1210  Weight: 176 lb 14.4 oz (80.2 kg)    GENERAL:alert, no distress and comfortable SKIN: skin color, texture, turgor are normal, no rashes or significant lesions Musculoskeletal:no cyanosis of digits and no clubbing  NEURO: alert & oriented x 3 with fluent speech, no focal motor/sensory deficits  LABORATORY DATA:  I have reviewed the data as listed    Component Value Date/Time   NA 139 02/21/2017 0239   K 4.3 02/21/2017 0239   CL 107 02/21/2017 0239   CO2 26 02/21/2017 0239   GLUCOSE 100 (H) 02/21/2017 0239   BUN 20 02/21/2017 0239   CREATININE 0.75 02/21/2017 0239   CALCIUM 9.1 02/21/2017 0239   PROT 6.9 02/20/2017 1611   ALBUMIN 4.2 02/20/2017 1611   AST 23 02/20/2017 1611   ALT 22 02/20/2017 1611   ALKPHOS 87 02/20/2017 1611   BILITOT 0.2 (L) 02/20/2017 1611   GFRNONAA >60 02/21/2017 0239   GFRAA >60 02/21/2017 0239    No results found for: SPEP, UPEP  Lab Results  Component Value Date   WBC 73.0 (HH) 02/21/2017   NEUTROABS 4.6 02/20/2017   HGB 12.4 02/21/2017   HCT 37.7 02/21/2017   MCV 93.3 02/21/2017   PLT 190 02/21/2017      Chemistry      Component Value Date/Time   NA 139 02/21/2017 0239   K 4.3 02/21/2017 0239   CL 107 02/21/2017 0239   CO2 26 02/21/2017 0239   BUN 20 02/21/2017 0239   CREATININE 0.75 02/21/2017 0239      Component Value Date/Time   CALCIUM 9.1 02/21/2017 0239   ALKPHOS 87 02/20/2017 1611   AST 23 02/20/2017 1611   ALT 22 02/20/2017 1611   BILITOT 0.2 (L) 02/20/2017 1611       RADIOGRAPHIC STUDIES: I have reviewed imaging study with  the patient I have personally reviewed the radiological images as listed and agreed with the findings in the report. Dg Chest 2 View  Result Date: 02/20/2017 CLINICAL DATA:  Left side chest pain x 3-4 days. States that she's under a lot of stress. Pt states that she started to feel pain in her left foot today. Hx diabetes, htn EXAM: CHEST  2 VIEW COMPARISON:  Chest CT, 02/19/2017.  Chest radiographs, 01/27/2013. FINDINGS: Cardiac silhouette is normal in size and configuration. No mediastinal or hilar masses. No convincing adenopathy. Clear lungs.  No pleural effusion or pneumothorax. Stable changes from a left mastectomy. Skeletal structures are intact. IMPRESSION: No active cardiopulmonary disease. Electronically Signed   By: Shanon Brow  Ormond M.D.   On: 02/20/2017 15:56   Ct Chest W Contrast  Result Date: 02/19/2017 CLINICAL DATA:  Chronic lymphocytic leukemia. EXAM: CT CHEST, ABDOMEN, AND PELVIS WITH CONTRAST TECHNIQUE: Multidetector CT imaging of the chest, abdomen and pelvis was performed following the standard protocol during bolus administration of intravenous contrast. CONTRAST:  132mL ISOVUE-300 IOPAMIDOL (ISOVUE-300) INJECTION 61% COMPARISON:  None. FINDINGS: CT CHEST FINDINGS Cardiovascular: The heart size appears normal. No pericardial effusion. Calcification in the left anterior descending coronary artery noted. RCA coronary artery calcification also noted Mediastinum/Nodes: No enlarged mediastinal, hilar, or axillary lymph nodes. Thyroid gland, trachea demonstrate no significant findings. Small hiatal hernia. Lungs/Pleura: No pleural effusion. No airspace consolidation or atelectasis. Tiny nodule within the lingula measures 3 mm, image 93 of series 4. Musculoskeletal: Previous left mastectomy and left axillary nodal dissection. No aggressive lytic or sclerotic bone lesions identified. CT ABDOMEN PELVIS FINDINGS Hepatobiliary: Scattered simple appearing liver cysts noted. No suspicious liver  abnormality. Previous cholecystectomy. No biliary dilatation. Pancreas: Unremarkable. No pancreatic ductal dilatation or surrounding inflammatory changes. Spleen: Normal in size without focal abnormality. Adrenals/Urinary Tract: The adrenal glands appear normal. There is a exophytic hyperdense lesion arising from the posterior cortex of the inferior pole of the right kidney measuring 1 cm and 40 HU, image 70 of series 2. Small hypodensity within the left kidney measures 7 mm and is too small to characterize. No hydronephrosis. Stomach/Bowel: No abnormal distension of the stomach. The small bowel loops have a normal course and caliber. No bowel obstruction. Extensive sigmoid diverticulosis identified. No acute inflammation. Vascular/Lymphatic: Mild aortic atherosclerosis. No aneurysm. No adenopathy identified within the abdomen. No enlarged pelvic or inguinal adenopathy. Reproductive: Status post hysterectomy. No adnexal masses. Other: No free fluid or fluid collections. Musculoskeletal: Previous bilateral hip arthroplasty. Degenerative disc disease and scoliosis identified within the lumbar spine. No suspicious bone lesions. IMPRESSION: 1. No mass or adenopathy identified within the chest, abdomen or pelvis. 2. The spleen is normal in size and appearance 3. Status post left mastectomy and left axillary nodal dissection. 4. Tiny nodule in the left upper lobe measures 3 mm. No follow-up needed if patient is low-risk. Non-contrast chest CT can be considered in 12 months if patient is high-risk. This recommendation follows the consensus statement: Guidelines for Management of Incidental Pulmonary Nodules Detected on CT Images: From the Fleischner Society 2017; Radiology 2017; 284:228-243. 5.  Aortic Atherosclerosis (ICD10-I70.0). 6. There is a small indeterminate intermediate attenuating lesion arising from the right kidney. This could represent a hemorrhagic cyst or solid enhancing neoplasm. More definitive assessment  could be obtained with renal protocol MRI. Electronically Signed   By: Kerby Moors M.D.   On: 02/19/2017 16:22   Ct Abdomen Pelvis W Contrast  Result Date: 02/19/2017 CLINICAL DATA:  Chronic lymphocytic leukemia. EXAM: CT CHEST, ABDOMEN, AND PELVIS WITH CONTRAST TECHNIQUE: Multidetector CT imaging of the chest, abdomen and pelvis was performed following the standard protocol during bolus administration of intravenous contrast. CONTRAST:  138mL ISOVUE-300 IOPAMIDOL (ISOVUE-300) INJECTION 61% COMPARISON:  None. FINDINGS: CT CHEST FINDINGS Cardiovascular: The heart size appears normal. No pericardial effusion. Calcification in the left anterior descending coronary artery noted. RCA coronary artery calcification also noted Mediastinum/Nodes: No enlarged mediastinal, hilar, or axillary lymph nodes. Thyroid gland, trachea demonstrate no significant findings. Small hiatal hernia. Lungs/Pleura: No pleural effusion. No airspace consolidation or atelectasis. Tiny nodule within the lingula measures 3 mm, image 93 of series 4. Musculoskeletal: Previous left mastectomy and left axillary nodal dissection. No aggressive  lytic or sclerotic bone lesions identified. CT ABDOMEN PELVIS FINDINGS Hepatobiliary: Scattered simple appearing liver cysts noted. No suspicious liver abnormality. Previous cholecystectomy. No biliary dilatation. Pancreas: Unremarkable. No pancreatic ductal dilatation or surrounding inflammatory changes. Spleen: Normal in size without focal abnormality. Adrenals/Urinary Tract: The adrenal glands appear normal. There is a exophytic hyperdense lesion arising from the posterior cortex of the inferior pole of the right kidney measuring 1 cm and 40 HU, image 70 of series 2. Small hypodensity within the left kidney measures 7 mm and is too small to characterize. No hydronephrosis. Stomach/Bowel: No abnormal distension of the stomach. The small bowel loops have a normal course and caliber. No bowel obstruction.  Extensive sigmoid diverticulosis identified. No acute inflammation. Vascular/Lymphatic: Mild aortic atherosclerosis. No aneurysm. No adenopathy identified within the abdomen. No enlarged pelvic or inguinal adenopathy. Reproductive: Status post hysterectomy. No adnexal masses. Other: No free fluid or fluid collections. Musculoskeletal: Previous bilateral hip arthroplasty. Degenerative disc disease and scoliosis identified within the lumbar spine. No suspicious bone lesions. IMPRESSION: 1. No mass or adenopathy identified within the chest, abdomen or pelvis. 2. The spleen is normal in size and appearance 3. Status post left mastectomy and left axillary nodal dissection. 4. Tiny nodule in the left upper lobe measures 3 mm. No follow-up needed if patient is low-risk. Non-contrast chest CT can be considered in 12 months if patient is high-risk. This recommendation follows the consensus statement: Guidelines for Management of Incidental Pulmonary Nodules Detected on CT Images: From the Fleischner Society 2017; Radiology 2017; 284:228-243. 5.  Aortic Atherosclerosis (ICD10-I70.0). 6. There is a small indeterminate intermediate attenuating lesion arising from the right kidney. This could represent a hemorrhagic cyst or solid enhancing neoplasm. More definitive assessment could be obtained with renal protocol MRI. Electronically Signed   By: Kerby Moors M.D.   On: 02/19/2017 16:22    ASSESSMENT & PLAN:  CLL (chronic lymphocytic leukemia) ([employer]) We have a long discussion today I reviewed the current guidelines Her only symptoms is fatigue but the patient attributed that to her Ambien and lorazepam that she takes regularly at night She is trying to take herself off those medications that could be contributing to her symptoms Clinically, except for the lymphocytosis, she had no detectable lymphadenopathy or cytopenias Her appetite is stable and she had no recent weight loss Ultimately, we are in agreement for  watchful observation only I plan to see her back in 3 months again with repeat blood work, history and examination  History of breast cancer She has no signs or symptoms of disease recurrence Observe only  Mass of right kidney She has abnormal kidney lesions Radiologist is recommending MRI of the abdomen After much discussion, she is in agreement to be referred to urologist first for evaluation I have sent a referral   Orders Placed This Encounter  Procedures  . Ambulatory referral to Urology    Referral Priority:   Routine    Referral Type:   Consultation    Referral Reason:   Specialty Services Required    Requested Specialty:   Urology    Number of Visits Requested:   1   All questions were answered. The patient knows to call the clinic with any problems, questions or concerns. No barriers to learning was detected. I spent 15 minutes counseling the patient face to face. The total time spent in the appointment was 20 minutes and more than 50% was on counseling and review of test results     Esti Demello  Alvy Bimler, MD 02/22/2017 1:46 PM

## 2017-02-22 NOTE — Assessment & Plan Note (Signed)
She has abnormal kidney lesions Radiologist is recommending MRI of the abdomen After much discussion, she is in agreement to be referred to urologist first for evaluation I have sent a referral

## 2017-02-22 NOTE — Telephone Encounter (Signed)
-----   Message from Heath Lark, MD sent at 02/22/2017  1:48 PM EST ----- Regarding: urology consult Watch out for that. I sent routine consult

## 2017-02-22 NOTE — Telephone Encounter (Signed)
Scheduled appts per 1/31 los - patient did not want avs or calendar.

## 2017-02-22 NOTE — Telephone Encounter (Signed)
Faxed referral to [facility].

## 2017-02-22 NOTE — Assessment & Plan Note (Signed)
We have a long discussion today I reviewed the current guidelines Her only symptoms is fatigue but the patient attributed that to her Ambien and lorazepam that she takes regularly at night She is trying to take herself off those medications that could be contributing to her symptoms Clinically, except for the lymphocytosis, she had no detectable lymphadenopathy or cytopenias Her appetite is stable and she had no recent weight loss Ultimately, we are in agreement for watchful observation only I plan to see her back in 3 months again with repeat blood work, history and examination

## 2017-02-22 NOTE — Assessment & Plan Note (Addendum)
She has no signs or symptoms of disease recurrence Observe only

## 2017-02-23 LAB — FISH,CLL PROGNOSTIC PANEL

## 2017-03-02 DIAGNOSIS — I10 Essential (primary) hypertension: Secondary | ICD-10-CM | POA: Diagnosis not present

## 2017-03-02 DIAGNOSIS — C9110 Chronic lymphocytic leukemia of B-cell type not having achieved remission: Secondary | ICD-10-CM | POA: Diagnosis not present

## 2017-03-02 DIAGNOSIS — G4733 Obstructive sleep apnea (adult) (pediatric): Secondary | ICD-10-CM | POA: Diagnosis not present

## 2017-03-02 DIAGNOSIS — L989 Disorder of the skin and subcutaneous tissue, unspecified: Secondary | ICD-10-CM | POA: Diagnosis not present

## 2017-03-02 DIAGNOSIS — E119 Type 2 diabetes mellitus without complications: Secondary | ICD-10-CM | POA: Diagnosis not present

## 2017-03-02 DIAGNOSIS — K219 Gastro-esophageal reflux disease without esophagitis: Secondary | ICD-10-CM | POA: Diagnosis not present

## 2017-03-02 DIAGNOSIS — G4700 Insomnia, unspecified: Secondary | ICD-10-CM | POA: Diagnosis not present

## 2017-03-02 DIAGNOSIS — F331 Major depressive disorder, recurrent, moderate: Secondary | ICD-10-CM | POA: Diagnosis not present

## 2017-03-02 DIAGNOSIS — R0789 Other chest pain: Secondary | ICD-10-CM | POA: Diagnosis not present

## 2017-03-07 ENCOUNTER — Telehealth: Payer: Self-pay | Admitting: *Deleted

## 2017-03-07 NOTE — Telephone Encounter (Signed)
Attempted to call patient regarding Urology consult- no answer  [facility]- appt is scheduled for 2/19 @ 2:15

## 2017-03-07 NOTE — Telephone Encounter (Signed)
Oral Oncology Pharmacist Encounter  Received notification from MD that patient has decided against starting treatment at this time.  No further needs from [facility] identified at this time. Oral Oncology Clinic will sign off.   Please let us know if we can be of assistance in the future.  Johny Drilling, PharmD, BCPS, BCOP 03/07/2017 11:35 AM Oral Oncology Clinic [phone]

## 2017-03-13 DIAGNOSIS — D4102 Neoplasm of uncertain behavior of left kidney: Secondary | ICD-10-CM | POA: Diagnosis not present

## 2017-03-14 DIAGNOSIS — F331 Major depressive disorder, recurrent, moderate: Secondary | ICD-10-CM | POA: Diagnosis not present

## 2017-03-15 DIAGNOSIS — F331 Major depressive disorder, recurrent, moderate: Secondary | ICD-10-CM | POA: Diagnosis not present

## 2017-03-15 DIAGNOSIS — F411 Generalized anxiety disorder: Secondary | ICD-10-CM | POA: Diagnosis not present

## 2017-03-15 DIAGNOSIS — G4700 Insomnia, unspecified: Secondary | ICD-10-CM | POA: Diagnosis not present

## 2017-03-19 DIAGNOSIS — G4733 Obstructive sleep apnea (adult) (pediatric): Secondary | ICD-10-CM | POA: Diagnosis not present

## 2017-03-19 DIAGNOSIS — G4721 Circadian rhythm sleep disorder, delayed sleep phase type: Secondary | ICD-10-CM | POA: Diagnosis not present

## 2017-03-22 DIAGNOSIS — L989 Disorder of the skin and subcutaneous tissue, unspecified: Secondary | ICD-10-CM | POA: Diagnosis not present

## 2017-03-22 DIAGNOSIS — G4700 Insomnia, unspecified: Secondary | ICD-10-CM | POA: Diagnosis not present

## 2017-03-22 DIAGNOSIS — C9110 Chronic lymphocytic leukemia of B-cell type not having achieved remission: Secondary | ICD-10-CM | POA: Diagnosis not present

## 2017-03-22 DIAGNOSIS — E119 Type 2 diabetes mellitus without complications: Secondary | ICD-10-CM | POA: Diagnosis not present

## 2017-03-22 DIAGNOSIS — R0789 Other chest pain: Secondary | ICD-10-CM | POA: Diagnosis not present

## 2017-03-22 DIAGNOSIS — F331 Major depressive disorder, recurrent, moderate: Secondary | ICD-10-CM | POA: Diagnosis not present

## 2017-03-22 DIAGNOSIS — I10 Essential (primary) hypertension: Secondary | ICD-10-CM | POA: Diagnosis not present

## 2017-03-22 DIAGNOSIS — K219 Gastro-esophageal reflux disease without esophagitis: Secondary | ICD-10-CM | POA: Diagnosis not present

## 2017-03-22 DIAGNOSIS — G4733 Obstructive sleep apnea (adult) (pediatric): Secondary | ICD-10-CM | POA: Diagnosis not present

## 2017-03-28 DIAGNOSIS — G4733 Obstructive sleep apnea (adult) (pediatric): Secondary | ICD-10-CM | POA: Diagnosis not present

## 2017-03-29 ENCOUNTER — Telehealth: Payer: Self-pay | Admitting: *Deleted

## 2017-03-29 NOTE — Telephone Encounter (Signed)
Patient called and left message requesting dates of previous appts.   Called her back and let her know that she has been seen here three times, 02-22-17;  02-12-17  And then 04-04-13.  She was very appreciative of the call back and the information.

## 2017-04-02 DIAGNOSIS — F331 Major depressive disorder, recurrent, moderate: Secondary | ICD-10-CM | POA: Diagnosis not present

## 2017-04-12 DIAGNOSIS — G4733 Obstructive sleep apnea (adult) (pediatric): Secondary | ICD-10-CM | POA: Diagnosis not present

## 2017-05-03 DIAGNOSIS — R05 Cough: Secondary | ICD-10-CM | POA: Diagnosis not present

## 2017-05-07 DIAGNOSIS — L570 Actinic keratosis: Secondary | ICD-10-CM | POA: Diagnosis not present

## 2017-05-07 DIAGNOSIS — C44712 Basal cell carcinoma of skin of right lower limb, including hip: Secondary | ICD-10-CM | POA: Diagnosis not present

## 2017-05-07 DIAGNOSIS — D485 Neoplasm of uncertain behavior of skin: Secondary | ICD-10-CM | POA: Diagnosis not present

## 2017-05-13 DIAGNOSIS — G4733 Obstructive sleep apnea (adult) (pediatric): Secondary | ICD-10-CM | POA: Diagnosis not present

## 2017-05-15 DIAGNOSIS — F331 Major depressive disorder, recurrent, moderate: Secondary | ICD-10-CM | POA: Diagnosis not present

## 2017-05-24 ENCOUNTER — Encounter: Payer: Self-pay | Admitting: Hematology and Oncology

## 2017-05-24 ENCOUNTER — Telehealth: Payer: Self-pay | Admitting: Hematology and Oncology

## 2017-05-24 ENCOUNTER — Inpatient Hospital Stay: Payer: Medicare HMO | Attending: Hematology and Oncology | Admitting: Hematology and Oncology

## 2017-05-24 ENCOUNTER — Telehealth: Payer: Self-pay

## 2017-05-24 ENCOUNTER — Inpatient Hospital Stay: Payer: Medicare HMO

## 2017-05-24 ENCOUNTER — Other Ambulatory Visit: Payer: Self-pay | Admitting: Hematology and Oncology

## 2017-05-24 DIAGNOSIS — C9110 Chronic lymphocytic leukemia of B-cell type not having achieved remission: Secondary | ICD-10-CM | POA: Diagnosis not present

## 2017-05-24 DIAGNOSIS — N2889 Other specified disorders of kidney and ureter: Secondary | ICD-10-CM | POA: Diagnosis not present

## 2017-05-24 DIAGNOSIS — Z853 Personal history of malignant neoplasm of breast: Secondary | ICD-10-CM

## 2017-05-24 DIAGNOSIS — Z79899 Other long term (current) drug therapy: Secondary | ICD-10-CM

## 2017-05-24 DIAGNOSIS — C9190 Lymphoid leukemia, unspecified not having achieved remission: Secondary | ICD-10-CM

## 2017-05-24 DIAGNOSIS — Z85828 Personal history of other malignant neoplasm of skin: Secondary | ICD-10-CM | POA: Diagnosis not present

## 2017-05-24 LAB — CBC WITH DIFFERENTIAL/PLATELET
BASOS PCT: 1 %
Basophils Absolute: 0.7 10*3/uL — ABNORMAL HIGH (ref 0.0–0.1)
Eosinophils Absolute: 0.5 10*3/uL (ref 0.0–0.5)
Eosinophils Relative: 1 %
HEMATOCRIT: 39.3 % (ref 34.8–46.6)
HEMOGLOBIN: 12.7 g/dL (ref 11.6–15.9)
Lymphocytes Relative: 91 %
Lymphs Abs: 57.2 10*3/uL — ABNORMAL HIGH (ref 0.9–3.3)
MCH: 30.7 pg (ref 25.1–34.0)
MCHC: 32.3 g/dL (ref 31.5–36.0)
MCV: 94.9 fL (ref 79.5–101.0)
Monocytes Absolute: 0.5 10*3/uL (ref 0.1–0.9)
Monocytes Relative: 1 %
NEUTROS ABS: 3.8 10*3/uL (ref 1.5–6.5)
NEUTROS PCT: 6 %
Platelets: 171 10*3/uL (ref 145–400)
RBC: 4.15 MIL/uL (ref 3.70–5.45)
RDW: 15.2 % — ABNORMAL HIGH (ref 11.2–14.5)
WBC: 62.6 10*3/uL — AB (ref 3.9–10.3)

## 2017-05-24 LAB — COMPREHENSIVE METABOLIC PANEL
ALBUMIN: 4 g/dL (ref 3.5–5.0)
ALK PHOS: 104 U/L (ref 40–150)
ALT: 17 U/L (ref 0–55)
AST: 17 U/L (ref 5–34)
Anion gap: 6 (ref 3–11)
BILIRUBIN TOTAL: 0.3 mg/dL (ref 0.2–1.2)
BUN: 22 mg/dL (ref 7–26)
CALCIUM: 9.8 mg/dL (ref 8.4–10.4)
CO2: 26 mmol/L (ref 22–29)
Chloride: 107 mmol/L (ref 98–109)
Creatinine, Ser: 0.8 mg/dL (ref 0.60–1.10)
GFR calc Af Amer: >60 mL/min (ref >60)
GFR calc non Af Amer: >60 mL/min (ref >60)
Glucose, Bld: 111 mg/dL (ref 70–140)
Potassium: 4.4 mmol/L (ref 3.5–5.1)
Sodium: 139 mmol/L (ref 136–145)
TOTAL PROTEIN: 6.5 g/dL (ref 6.4–8.3)

## 2017-05-24 LAB — LACTATE DEHYDROGENASE: LDH: 288 U/L — ABNORMAL HIGH (ref 125–245)

## 2017-05-24 NOTE — Assessment & Plan Note (Signed)
Her CLL is stable without signs of progression In fact, total white blood cell count is improved I plan to see her back again in 5 months for further history, physical examination and blood work

## 2017-05-24 NOTE — Assessment & Plan Note (Signed)
She has recent skin cancer removal Her wound appears to be healing well and I put a new dressing change over it I recommend avoidance of excessive sun exposure due to high risk of skin cancer recurrence with CLL. She will continue close follow-up with dermatologist

## 2017-05-24 NOTE — Telephone Encounter (Signed)
Appt scheduled  AVS/Calendar printed per 5/2 los °

## 2017-05-24 NOTE — Assessment & Plan Note (Signed)
She was referred to see urologist with further follow-up She has no symptoms I will defer to them for further management

## 2017-05-24 NOTE — Telephone Encounter (Signed)
Received call from lab with pt's WBC count as 62.6. Notified Dr Alvy Bimler. No new orders received at this time.

## 2017-05-24 NOTE — Progress Notes (Signed)
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 She will continue close follow-up with dermatologist   Orders Placed This Encounter  Procedures  . CBC with Differential/Platelet    Standing Status:   Future    Standing Expiration Date:   06/28/2018    INTERVAL HISTORY: Please see below for problem oriented charting. She returns for further follow-up She had recent skin surgery with punch biopsy on the right lower leg.  She is concerned about wound healing She has no new lymphadenopathy Denies recent infection Denies pain or abnormal hematuria Her appetite is stable without recent weight loss  SUMMARY OF ONCOLOGIC HISTORY:   CLL (chronic lymphocytic leukemia) ([place])   02/12/2017 Pathology Results    FISH is positive for trisomy 12      02/12/2017 Pathology Results    Flow cytometry: The overall findings favor chronic lymphocytic leukemia      02/19/2017 Imaging    1. No mass or adenopathy identified within the chest, abdomen or pelvis. 2. The spleen is normal in size and appearance 3. Status post left mastectomy and left axillary nodal dissection. 4. Tiny nodule in the left upper lobe measures 3 mm. No follow-up needed if patient is low-risk. Non-contrast chest CT can  be considered in 12 months if patient is high-risk.  5. Aortic Atherosclerosis (ICD10-I70.0). 6. There is a small indeterminate intermediate attenuating lesion arising from the right kidney. This could represent a hemorrhagic cyst or solid enhancing neoplasm. More definitive assessment could be obtained with renal protocol MRI.       REVIEW OF SYSTEMS:   Constitutional: Denies fevers, chills or abnormal weight loss Eyes: Denies blurriness of vision Ears, nose, mouth, throat, and face: Denies mucositis or sore throat Respiratory: Denies cough, dyspnea or wheezes Cardiovascular: Denies palpitation, chest discomfort or lower extremity swelling Gastrointestinal:  Denies nausea, heartburn or change in bowel habits Lymphatics: Denies new lymphadenopathy or easy bruising Neurological:Denies numbness, tingling or new weaknesses Behavioral/Psych: Mood is stable, no new changes  All other systems were reviewed with the patient and are negative.  I have reviewed the past medical history, past surgical history, social history and family history with the patient and they are unchanged from previous note.  ALLERGIES:  is allergic to ciprofloxacin and quinolones.  MEDICATIONS:  Current Outpatient Medications  Medication Sig Dispense Refill  . aspirin EC 81 MG EC tablet Take 1 tablet (81 mg total) by mouth daily. 30 tablet 0  . buPROPion (WELLBUTRIN XL) 150 MG 24 hr tablet Take 150 mg by mouth daily.    . carvedilol (COREG) 6.25 MG tablet Take 1 tablet (6.25 mg total) by mouth 2 (two) times daily with a meal. 60 tablet 0  . Cholecalciferol (VITAMIN D PO) Take 1,000 Units by mouth  daily.    . Cyanocobalamin (VITAMIN B-12 PO) Take 1 tablet by mouth daily.    . diclofenac (VOLTAREN) 75 MG EC tablet TAKE 1 TABLET (75 MG TOTAL) BY MOUTH 2 (TWO) TIMES DAILY. TAKE WITH FOOD 60 tablet 0  . Docusate Calcium (STOOL SOFTENER PO) Take 1 tablet by mouth daily as needed (CONSTIPATION).    . fluticasone (FLONASE)  50 MCG/ACT nasal spray Place 1 spray into both nostrils daily as needed for allergies or rhinitis.    Marland Kitchen ketorolac (TORADOL) 10 MG tablet Take 1 tablet (10 mg total) by mouth every 6 (six) hours as needed. 20 tablet 0  . LORazepam (ATIVAN) 1 MG tablet Take 1 mg by mouth at bedtime.     Marland Kitchen losartan (COZAAR) 100 MG tablet Take 100 mg by mouth daily.     . methocarbamol (ROBAXIN) 500 MG tablet Take 1 tablet (500 mg total) by mouth every 6 (six) hours as needed for muscle spasms. (Patient not taking: Reported on 02/20/2017) 60 tablet 0  . Multiple Vitamins-Calcium (ONE-A-DAY WOMENS PO) Take 1 tablet by mouth daily.    . mupirocin ointment (BACTROBAN) 2 % Apply 1 application topically daily.    . nitroGLYCERIN (NITROSTAT) 0.4 MG SL tablet Place 1 tablet (0.4 mg total) under the tongue every 5 (five) minutes as needed for chest pain. 100 tablet 0  . omeprazole (PRILOSEC OTC) 20 MG tablet Take 20 mg by mouth at bedtime.     Marland Kitchen zolpidem (AMBIEN) 10 MG tablet Take 15 mg by mouth at bedtime as needed for sleep.      No current facility-administered medications for this visit.     PHYSICAL EXAMINATION: ECOG PERFORMANCE STATUS: 1 - Symptomatic but completely ambulatory  Vitals:   05/24/17 1153  BP: 136/66  Pulse: 83  Resp: 18  Temp: (!) 97.5 F (36.4 C)  SpO2: 98%   Filed Weights   05/24/17 1153  Weight: 178 lb (80.7 kg)    GENERAL:alert, no distress and comfortable SKIN: I removed the bandage over her right lower leg.  The punch biopsy site appears to be healing well. A new Band-Aid is placed EYES: normal, Conjunctiva are pink and non-injected, sclera clear OROPHARYNX:no exudate, no erythema and lips, buccal mucosa, and tongue normal  NECK: supple, thyroid normal size, non-tender, without nodularity LYMPH:  no palpable lymphadenopathy in the cervical, axillary or inguinal LUNGS: clear to auscultation and percussion with normal breathing effort HEART: regular rate & rhythm and no murmurs and no  lower extremity edema ABDOMEN:abdomen soft, non-tender and normal bowel sounds Musculoskeletal:no cyanosis of digits and no clubbing  NEURO: alert & oriented x 3 with fluent speech, no focal motor/sensory deficits  LABORATORY DATA:  I have reviewed the data as listed    Component Value Date/Time   NA 139 05/24/2017 1128   K 4.4 05/24/2017 1128   CL 107 05/24/2017 1128   CO2 26 05/24/2017 1128   GLUCOSE 111 05/24/2017 1128   BUN 22 05/24/2017 1128   CREATININE 0.80 05/24/2017 1128   CALCIUM 9.8 05/24/2017 1128   PROT 6.5 05/24/2017 1128   ALBUMIN 4.0 05/24/2017 1128   AST 17 05/24/2017 1128   ALT 17 05/24/2017 1128   ALKPHOS 104 05/24/2017 1128   BILITOT 0.3 05/24/2017 1128   GFRNONAA >60 05/24/2017 1128   GFRAA >60 05/24/2017 1128    No results found for: SPEP, UPEP  Lab Results  Component Value Date   WBC 62.6 (HH) 05/24/2017   NEUTROABS 3.8 05/24/2017  HGB 12.7 05/24/2017   HCT 39.3 05/24/2017   MCV 94.9 05/24/2017   PLT 171 05/24/2017      Chemistry      Component Value Date/Time   NA 139 05/24/2017 1128   K 4.4 05/24/2017 1128   CL 107 05/24/2017 1128   CO2 26 05/24/2017 1128   BUN 22 05/24/2017 1128   CREATININE 0.80 05/24/2017 1128      Component Value Date/Time   CALCIUM 9.8 05/24/2017 1128   ALKPHOS 104 05/24/2017 1128   AST 17 05/24/2017 1128   ALT 17 05/24/2017 1128   BILITOT 0.3 05/24/2017 1128       All questions were answered. The patient knows to call the clinic with any problems, questions or concerns. No barriers to learning was detected.  I spent 15 minutes counseling the patient face to face. The total time spent in the appointment was 20 minutes and more than 50% was on counseling and review of test results  Heath Lark, MD 05/24/2017 12:36 PM

## 2017-06-12 DIAGNOSIS — G4733 Obstructive sleep apnea (adult) (pediatric): Secondary | ICD-10-CM | POA: Diagnosis not present

## 2017-06-15 DIAGNOSIS — N2889 Other specified disorders of kidney and ureter: Secondary | ICD-10-CM | POA: Diagnosis not present

## 2017-06-15 DIAGNOSIS — Z1389 Encounter for screening for other disorder: Secondary | ICD-10-CM | POA: Diagnosis not present

## 2017-06-15 DIAGNOSIS — I10 Essential (primary) hypertension: Secondary | ICD-10-CM | POA: Diagnosis not present

## 2017-06-15 DIAGNOSIS — E119 Type 2 diabetes mellitus without complications: Secondary | ICD-10-CM | POA: Diagnosis not present

## 2017-06-15 DIAGNOSIS — C9110 Chronic lymphocytic leukemia of B-cell type not having achieved remission: Secondary | ICD-10-CM | POA: Diagnosis not present

## 2017-06-15 DIAGNOSIS — G4733 Obstructive sleep apnea (adult) (pediatric): Secondary | ICD-10-CM | POA: Diagnosis not present

## 2017-06-15 DIAGNOSIS — F331 Major depressive disorder, recurrent, moderate: Secondary | ICD-10-CM | POA: Diagnosis not present

## 2017-06-15 DIAGNOSIS — Z6833 Body mass index (BMI) 33.0-33.9, adult: Secondary | ICD-10-CM | POA: Diagnosis not present

## 2017-06-21 DIAGNOSIS — E1169 Type 2 diabetes mellitus with other specified complication: Secondary | ICD-10-CM | POA: Diagnosis not present

## 2017-06-21 DIAGNOSIS — G4733 Obstructive sleep apnea (adult) (pediatric): Secondary | ICD-10-CM | POA: Diagnosis not present

## 2017-07-02 ENCOUNTER — Other Ambulatory Visit (INDEPENDENT_AMBULATORY_CARE_PROVIDER_SITE_OTHER): Payer: Self-pay | Admitting: Physical Medicine and Rehabilitation

## 2017-07-02 ENCOUNTER — Ambulatory Visit
Admission: RE | Admit: 2017-07-02 | Discharge: 2017-07-02 | Disposition: A | Discharge status: Home / Self Care | Payer: Medicare HMO | Source: Ambulatory Visit | Attending: Gastroenterology | Admitting: Gastroenterology

## 2017-07-02 ENCOUNTER — Other Ambulatory Visit: Payer: Self-pay | Admitting: Gastroenterology

## 2017-07-02 DIAGNOSIS — K5900 Constipation, unspecified: Secondary | ICD-10-CM

## 2017-07-02 DIAGNOSIS — R140 Abdominal distension (gaseous): Secondary | ICD-10-CM | POA: Diagnosis not present

## 2017-07-02 DIAGNOSIS — R1030 Lower abdominal pain, unspecified: Secondary | ICD-10-CM | POA: Diagnosis not present

## 2017-07-02 NOTE — Telephone Encounter (Signed)
Please advise 

## 2017-07-04 DIAGNOSIS — C50912 Malignant neoplasm of unspecified site of left female breast: Secondary | ICD-10-CM | POA: Diagnosis not present

## 2017-07-09 DIAGNOSIS — F331 Major depressive disorder, recurrent, moderate: Secondary | ICD-10-CM | POA: Diagnosis not present

## 2017-07-13 ENCOUNTER — Other Ambulatory Visit (HOSPITAL_COMMUNITY): Payer: Self-pay | Admitting: Psychiatry

## 2017-07-13 DIAGNOSIS — G4733 Obstructive sleep apnea (adult) (pediatric): Secondary | ICD-10-CM | POA: Diagnosis not present

## 2017-08-03 DIAGNOSIS — F331 Major depressive disorder, recurrent, moderate: Secondary | ICD-10-CM | POA: Diagnosis not present

## 2017-08-09 ENCOUNTER — Other Ambulatory Visit (INDEPENDENT_AMBULATORY_CARE_PROVIDER_SITE_OTHER): Payer: Self-pay | Admitting: Physical Medicine and Rehabilitation

## 2017-08-09 NOTE — Telephone Encounter (Signed)
Please advise 

## 2017-08-10 DIAGNOSIS — R140 Abdominal distension (gaseous): Secondary | ICD-10-CM | POA: Diagnosis not present

## 2017-08-10 DIAGNOSIS — K5900 Constipation, unspecified: Secondary | ICD-10-CM | POA: Diagnosis not present

## 2017-08-10 DIAGNOSIS — K219 Gastro-esophageal reflux disease without esophagitis: Secondary | ICD-10-CM | POA: Diagnosis not present

## 2017-08-12 DIAGNOSIS — G4733 Obstructive sleep apnea (adult) (pediatric): Secondary | ICD-10-CM | POA: Diagnosis not present

## 2017-08-16 DIAGNOSIS — D692 Other nonthrombocytopenic purpura: Secondary | ICD-10-CM | POA: Diagnosis not present

## 2017-08-16 DIAGNOSIS — D1801 Hemangioma of skin and subcutaneous tissue: Secondary | ICD-10-CM | POA: Diagnosis not present

## 2017-08-16 DIAGNOSIS — L814 Other melanin hyperpigmentation: Secondary | ICD-10-CM | POA: Diagnosis not present

## 2017-08-16 DIAGNOSIS — L570 Actinic keratosis: Secondary | ICD-10-CM | POA: Diagnosis not present

## 2017-08-16 DIAGNOSIS — D2262 Melanocytic nevi of left upper limb, including shoulder: Secondary | ICD-10-CM | POA: Diagnosis not present

## 2017-08-16 DIAGNOSIS — D485 Neoplasm of uncertain behavior of skin: Secondary | ICD-10-CM | POA: Diagnosis not present

## 2017-08-16 DIAGNOSIS — D045 Carcinoma in situ of skin of trunk: Secondary | ICD-10-CM | POA: Diagnosis not present

## 2017-08-16 DIAGNOSIS — L821 Other seborrheic keratosis: Secondary | ICD-10-CM | POA: Diagnosis not present

## 2017-08-17 DIAGNOSIS — F331 Major depressive disorder, recurrent, moderate: Secondary | ICD-10-CM | POA: Diagnosis not present

## 2017-09-12 DIAGNOSIS — G4733 Obstructive sleep apnea (adult) (pediatric): Secondary | ICD-10-CM | POA: Diagnosis not present

## 2017-09-28 DIAGNOSIS — F331 Major depressive disorder, recurrent, moderate: Secondary | ICD-10-CM | POA: Diagnosis not present

## 2017-10-03 DIAGNOSIS — C9110 Chronic lymphocytic leukemia of B-cell type not having achieved remission: Secondary | ICD-10-CM | POA: Diagnosis not present

## 2017-10-03 DIAGNOSIS — Z23 Encounter for immunization: Secondary | ICD-10-CM | POA: Diagnosis not present

## 2017-10-03 DIAGNOSIS — I10 Essential (primary) hypertension: Secondary | ICD-10-CM | POA: Diagnosis not present

## 2017-10-03 DIAGNOSIS — Z6832 Body mass index (BMI) 32.0-32.9, adult: Secondary | ICD-10-CM | POA: Diagnosis not present

## 2017-10-03 DIAGNOSIS — N2889 Other specified disorders of kidney and ureter: Secondary | ICD-10-CM | POA: Diagnosis not present

## 2017-10-03 DIAGNOSIS — E119 Type 2 diabetes mellitus without complications: Secondary | ICD-10-CM | POA: Diagnosis not present

## 2017-10-03 DIAGNOSIS — F331 Major depressive disorder, recurrent, moderate: Secondary | ICD-10-CM | POA: Diagnosis not present

## 2017-10-03 DIAGNOSIS — K589 Irritable bowel syndrome without diarrhea: Secondary | ICD-10-CM | POA: Diagnosis not present

## 2017-10-05 DIAGNOSIS — C50912 Malignant neoplasm of unspecified site of left female breast: Secondary | ICD-10-CM | POA: Diagnosis not present

## 2017-10-13 DIAGNOSIS — G4733 Obstructive sleep apnea (adult) (pediatric): Secondary | ICD-10-CM | POA: Diagnosis not present

## 2017-10-15 DIAGNOSIS — G4700 Insomnia, unspecified: Secondary | ICD-10-CM | POA: Diagnosis not present

## 2017-10-15 DIAGNOSIS — F411 Generalized anxiety disorder: Secondary | ICD-10-CM | POA: Diagnosis not present

## 2017-10-15 DIAGNOSIS — F331 Major depressive disorder, recurrent, moderate: Secondary | ICD-10-CM | POA: Diagnosis not present

## 2017-10-16 DIAGNOSIS — E119 Type 2 diabetes mellitus without complications: Secondary | ICD-10-CM | POA: Diagnosis not present

## 2017-10-16 DIAGNOSIS — H2511 Age-related nuclear cataract, right eye: Secondary | ICD-10-CM | POA: Diagnosis not present

## 2017-10-16 DIAGNOSIS — H35372 Puckering of macula, left eye: Secondary | ICD-10-CM | POA: Diagnosis not present

## 2017-10-16 DIAGNOSIS — H5211 Myopia, right eye: Secondary | ICD-10-CM | POA: Diagnosis not present

## 2017-10-22 DIAGNOSIS — F331 Major depressive disorder, recurrent, moderate: Secondary | ICD-10-CM | POA: Diagnosis not present

## 2017-10-24 DIAGNOSIS — C50912 Malignant neoplasm of unspecified site of left female breast: Secondary | ICD-10-CM | POA: Diagnosis not present

## 2017-11-08 ENCOUNTER — Inpatient Hospital Stay (HOSPITAL_BASED_OUTPATIENT_CLINIC_OR_DEPARTMENT_OTHER): Payer: Medicare HMO | Admitting: Hematology and Oncology

## 2017-11-08 ENCOUNTER — Encounter: Payer: Self-pay | Admitting: Hematology and Oncology

## 2017-11-08 ENCOUNTER — Inpatient Hospital Stay: Payer: Medicare HMO | Attending: Hematology and Oncology

## 2017-11-08 ENCOUNTER — Telehealth: Payer: Self-pay

## 2017-11-08 DIAGNOSIS — N2889 Other specified disorders of kidney and ureter: Secondary | ICD-10-CM | POA: Insufficient documentation

## 2017-11-08 DIAGNOSIS — Z85828 Personal history of other malignant neoplasm of skin: Secondary | ICD-10-CM | POA: Insufficient documentation

## 2017-11-08 DIAGNOSIS — Z79899 Other long term (current) drug therapy: Secondary | ICD-10-CM | POA: Insufficient documentation

## 2017-11-08 DIAGNOSIS — Z7982 Long term (current) use of aspirin: Secondary | ICD-10-CM | POA: Insufficient documentation

## 2017-11-08 DIAGNOSIS — C9110 Chronic lymphocytic leukemia of B-cell type not having achieved remission: Secondary | ICD-10-CM

## 2017-11-08 LAB — CBC WITH DIFFERENTIAL/PLATELET
BASOS ABS: 0 10*3/uL (ref 0.0–0.1)
BASOS PCT: 0 %
Eosinophils Absolute: 1.8 10*3/uL — ABNORMAL HIGH (ref 0.0–0.5)
Eosinophils Relative: 2 %
HCT: 39.1 % (ref 36.0–46.0)
HEMOGLOBIN: 12.3 g/dL (ref 12.0–15.0)
Lymphocytes Relative: 95 %
Lymphs Abs: 86.8 10*3/uL — ABNORMAL HIGH (ref 0.7–4.0)
MCH: 30.6 pg (ref 26.0–34.0)
MCHC: 31.5 g/dL (ref 30.0–36.0)
MCV: 97.3 fL (ref 80.0–100.0)
MONOS PCT: 0 %
Monocytes Absolute: 0 10*3/uL — ABNORMAL LOW (ref 0.1–1.0)
NEUTROS ABS: 2.7 10*3/uL (ref 1.7–17.7)
NEUTROS PCT: 3 %
Platelets: 173 10*3/uL (ref 150–400)
RBC: 4.02 MIL/uL (ref 3.87–5.11)
RDW: 15.7 % — ABNORMAL HIGH (ref 11.5–15.5)
WBC: 91.4 10*3/uL (ref 4.0–10.5)
nRBC: 0 % (ref 0.0–0.2)

## 2017-11-08 NOTE — Progress Notes (Signed)
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  I encouraged her to do so   No orders of the defined types were placed in this encounter.   INTERVAL HISTORY: Please see below for problem oriented charting. She returns for further follow-up No new lymphadenopathy denies recent infection, fever or chills She has not seen or undergo further evaluation for her renal lesion  SUMMARY OF ONCOLOGIC HISTORY:   CLL (chronic lymphocytic leukemia) ([place])   02/12/2017 Pathology Results    FISH is positive for trisomy 12    02/12/2017 Pathology Results    Flow cytometry: The overall findings favor chronic lymphocytic leukemia    02/19/2017 Imaging    1. No mass or adenopathy identified within the chest, abdomen or pelvis. 2. The spleen is normal in size and appearance 3. Status post left mastectomy and left axillary nodal dissection. 4. Tiny nodule in the left upper lobe measures 3 mm. No follow-up needed if patient is low-risk. Non-contrast chest CT can be considered in 12 months if patient is high-risk.  5. Aortic Atherosclerosis (ICD10-I70.0). 6. There is a small indeterminate intermediate attenuating lesion arising from the right kidney. This could represent a hemorrhagic cyst or solid enhancing neoplasm. More definitive assessment could be obtained with renal protocol MRI.     REVIEW OF SYSTEMS:   Constitutional: Denies  fevers, chills or abnormal weight loss Eyes: Denies blurriness of vision Ears, nose, mouth, throat, and face: Denies mucositis or sore throat Respiratory: Denies cough, dyspnea or wheezes Cardiovascular: Denies palpitation, chest discomfort or lower extremity swelling Gastrointestinal:  Denies nausea, heartburn or change in bowel habits Skin: Denies abnormal skin rashes Lymphatics: Denies new lymphadenopathy or easy bruising Neurological:Denies numbness, tingling or new weaknesses Behavioral/Psych: Mood is stable, no new changes  All other systems were reviewed with the patient and are negative.  I have reviewed the past medical history, past surgical history, social history and family history with the patient and they are unchanged from previous note.  ALLERGIES:  is allergic to ciprofloxacin and quinolones.  MEDICATIONS:  Current Outpatient Medications  Medication Sig Dispense Refill  . aspirin EC 81 MG EC tablet Take 1 tablet (81 mg total) by mouth daily. 30 tablet 0  . buPROPion (WELLBUTRIN XL) 150 MG 24 hr tablet Take 150 mg by mouth daily.    . carvedilol (COREG) 6.25 MG tablet Take 1 tablet (6.25 mg total) by mouth 2 (two) times daily with a meal. 60 tablet 0  . Cholecalciferol (VITAMIN D PO) Take 1,000 Units by mouth daily.    . Cyanocobalamin (VITAMIN B-12 PO) Take 1 tablet by mouth daily.    . diclofenac (VOLTAREN) 75 MG EC tablet TAKE 1 TABLET (75 MG TOTAL) BY MOUTH 2 (TWO) TIMES DAILY. TAKE WITH FOOD 60 tablet 0  . Docusate Calcium (STOOL SOFTENER PO) Take 1 tablet by mouth daily as needed (CONSTIPATION).    . fluticasone (FLONASE) 50 MCG/ACT nasal spray  Place 1 spray into both nostrils daily as needed for allergies or rhinitis.    Marland Kitchen ketorolac (TORADOL) 10 MG tablet Take 1 tablet (10 mg total) by mouth every 6 (six) hours as needed. 20 tablet 0  . LORazepam (ATIVAN) 1 MG tablet Take 1 mg by mouth at bedtime.     Marland Kitchen losartan (COZAAR) 100 MG tablet Take 100 mg by mouth daily.      . methocarbamol (ROBAXIN) 500 MG tablet Take 1 tablet (500 mg total) by mouth every 6 (six) hours as needed for muscle spasms. (Patient not taking: Reported on 02/20/2017) 60 tablet 0  . Multiple Vitamins-Calcium (ONE-A-DAY WOMENS PO) Take 1 tablet by mouth daily.    . mupirocin ointment (BACTROBAN) 2 % Apply 1 application topically daily.    . nitroGLYCERIN (NITROSTAT) 0.4 MG SL tablet Place 1 tablet (0.4 mg total) under the tongue every 5 (five) minutes as needed for chest pain. 100 tablet 0  . omeprazole (PRILOSEC OTC) 20 MG tablet Take 20 mg by mouth at bedtime.     Marland Kitchen zolpidem (AMBIEN) 10 MG tablet Take 15 mg by mouth at bedtime as needed for sleep.      No current facility-administered medications for this visit.     PHYSICAL EXAMINATION: ECOG PERFORMANCE STATUS: 0 - Asymptomatic  Vitals:   11/08/17 1146  BP: (!) 149/78  Pulse: 74  Resp: 19  Temp: 97.6 F (36.4 C)  SpO2: 98%   Filed Weights   11/08/17 1146  Weight: 175 lb 9.6 oz (79.7 kg)    GENERAL:alert, no distress and comfortable SKIN: skin color, texture, turgor are normal, no rashes or significant lesions EYES: normal, Conjunctiva are pink and non-injected, sclera clear OROPHARYNX:no exudate, no erythema and lips, buccal mucosa, and tongue normal  NECK: supple, thyroid normal size, non-tender, without nodularity LYMPH:  no palpable lymphadenopathy in the cervical, axillary or inguinal LUNGS: clear to auscultation and percussion with normal breathing effort HEART: regular rate & rhythm and no murmurs and no lower extremity edema ABDOMEN:abdomen soft, non-tender and normal bowel sounds Musculoskeletal:no cyanosis of digits and no clubbing  NEURO: alert & oriented x 3 with fluent speech, no focal motor/sensory deficits  LABORATORY DATA:  I have reviewed the data as listed    Component Value Date/Time   NA 139 05/24/2017 1128   K 4.4 05/24/2017 1128   CL 107 05/24/2017 1128   CO2 26 05/24/2017 1128   GLUCOSE 111  05/24/2017 1128   BUN 22 05/24/2017 1128   CREATININE 0.80 05/24/2017 1128   CALCIUM 9.8 05/24/2017 1128   PROT 6.5 05/24/2017 1128   ALBUMIN 4.0 05/24/2017 1128   AST 17 05/24/2017 1128   ALT 17 05/24/2017 1128   ALKPHOS 104 05/24/2017 1128   BILITOT 0.3 05/24/2017 1128   GFRNONAA >60 05/24/2017 1128   GFRAA >60 05/24/2017 1128    No results found for: SPEP, UPEP  Lab Results  Component Value Date   WBC 91.4 (HH) 11/08/2017   NEUTROABS 2.7 11/08/2017   HGB 12.3 11/08/2017   HCT 39.1 11/08/2017   MCV 97.3 11/08/2017   PLT 173 11/08/2017      Chemistry      Component Value Date/Time   NA 139 05/24/2017 1128   K 4.4 05/24/2017 1128   CL 107 05/24/2017 1128   CO2 26 05/24/2017 1128   BUN 22 05/24/2017 1128   CREATININE 0.80 05/24/2017 1128      Component Value Date/Time   CALCIUM 9.8 05/24/2017  1128   ALKPHOS 104 05/24/2017 1128   AST 17 05/24/2017 1128   ALT 17 05/24/2017 1128   BILITOT 0.3 05/24/2017 1128       All questions were answered. The patient knows to call the clinic with any problems, questions or concerns. No barriers to learning was detected.  I spent 10 minutes counseling the patient face to face. The total time spent in the appointment was 15 minutes and more than 50% was on counseling and review of test results  Heath Lark, MD 11/08/2017 4:06 PM

## 2017-11-08 NOTE — Telephone Encounter (Signed)
Printed avs and calender of upcoming appointment. Per 10/17 los 

## 2017-11-08 NOTE — Assessment & Plan Note (Signed)
She will follow up closely with dermatologist 

## 2017-11-08 NOTE — Assessment & Plan Note (Signed)
Her CLL is stable with mild signs of progression She is not symptomatic with no anemia, abnormal night sweats, anorexia or thrombocytopenia There is no indication to treat I plan to see her back again in 3 months for further history, physical examination and blood work

## 2017-11-08 NOTE — Assessment & Plan Note (Signed)
She has not made an appointment to see urologist for follow-up MRI.  I encouraged her to do so

## 2017-11-09 ENCOUNTER — Telehealth: Payer: Self-pay | Admitting: Hematology and Oncology

## 2017-11-09 NOTE — Telephone Encounter (Signed)
Printed medical records to be mailed to patient, Release [id number]

## 2017-11-12 DIAGNOSIS — G4733 Obstructive sleep apnea (adult) (pediatric): Secondary | ICD-10-CM | POA: Diagnosis not present

## 2017-11-19 DIAGNOSIS — F331 Major depressive disorder, recurrent, moderate: Secondary | ICD-10-CM | POA: Diagnosis not present

## 2017-12-04 DIAGNOSIS — E7849 Other hyperlipidemia: Secondary | ICD-10-CM | POA: Diagnosis not present

## 2017-12-04 DIAGNOSIS — E119 Type 2 diabetes mellitus without complications: Secondary | ICD-10-CM | POA: Diagnosis not present

## 2017-12-04 DIAGNOSIS — R82998 Other abnormal findings in urine: Secondary | ICD-10-CM | POA: Diagnosis not present

## 2017-12-11 DIAGNOSIS — E7849 Other hyperlipidemia: Secondary | ICD-10-CM | POA: Diagnosis not present

## 2017-12-11 DIAGNOSIS — E1169 Type 2 diabetes mellitus with other specified complication: Secondary | ICD-10-CM | POA: Diagnosis not present

## 2017-12-11 DIAGNOSIS — Z0000 Encounter for general adult medical examination without abnormal findings: Secondary | ICD-10-CM | POA: Diagnosis not present

## 2017-12-11 DIAGNOSIS — C9110 Chronic lymphocytic leukemia of B-cell type not having achieved remission: Secondary | ICD-10-CM | POA: Diagnosis not present

## 2017-12-11 DIAGNOSIS — K589 Irritable bowel syndrome without diarrhea: Secondary | ICD-10-CM | POA: Diagnosis not present

## 2017-12-11 DIAGNOSIS — N2889 Other specified disorders of kidney and ureter: Secondary | ICD-10-CM | POA: Diagnosis not present

## 2017-12-11 DIAGNOSIS — G4733 Obstructive sleep apnea (adult) (pediatric): Secondary | ICD-10-CM | POA: Diagnosis not present

## 2017-12-11 DIAGNOSIS — F331 Major depressive disorder, recurrent, moderate: Secondary | ICD-10-CM | POA: Diagnosis not present

## 2017-12-11 DIAGNOSIS — I10 Essential (primary) hypertension: Secondary | ICD-10-CM | POA: Diagnosis not present

## 2017-12-13 DIAGNOSIS — G4733 Obstructive sleep apnea (adult) (pediatric): Secondary | ICD-10-CM | POA: Diagnosis not present

## 2018-01-12 DIAGNOSIS — G4733 Obstructive sleep apnea (adult) (pediatric): Secondary | ICD-10-CM | POA: Diagnosis not present

## 2018-01-25 DIAGNOSIS — F331 Major depressive disorder, recurrent, moderate: Secondary | ICD-10-CM | POA: Diagnosis not present

## 2018-02-07 ENCOUNTER — Other Ambulatory Visit: Payer: Self-pay

## 2018-02-07 DIAGNOSIS — C9110 Chronic lymphocytic leukemia of B-cell type not having achieved remission: Secondary | ICD-10-CM

## 2018-02-08 ENCOUNTER — Encounter: Payer: Self-pay | Admitting: Hematology and Oncology

## 2018-02-08 ENCOUNTER — Telehealth: Payer: Self-pay | Admitting: Hematology and Oncology

## 2018-02-08 ENCOUNTER — Inpatient Hospital Stay (HOSPITAL_BASED_OUTPATIENT_CLINIC_OR_DEPARTMENT_OTHER): Payer: Medicare HMO | Admitting: Hematology and Oncology

## 2018-02-08 ENCOUNTER — Inpatient Hospital Stay: Payer: Medicare HMO | Attending: Hematology and Oncology

## 2018-02-08 VITALS — BP 144/89 | HR 75 | Temp 97.7°F | Resp 18 | Ht 62.0 in | Wt 173.6 lb

## 2018-02-08 DIAGNOSIS — Z7982 Long term (current) use of aspirin: Secondary | ICD-10-CM | POA: Insufficient documentation

## 2018-02-08 DIAGNOSIS — Z853 Personal history of malignant neoplasm of breast: Secondary | ICD-10-CM | POA: Insufficient documentation

## 2018-02-08 DIAGNOSIS — Z79899 Other long term (current) drug therapy: Secondary | ICD-10-CM

## 2018-02-08 DIAGNOSIS — Z9012 Acquired absence of left breast and nipple: Secondary | ICD-10-CM

## 2018-02-08 DIAGNOSIS — C9110 Chronic lymphocytic leukemia of B-cell type not having achieved remission: Secondary | ICD-10-CM

## 2018-02-08 DIAGNOSIS — N2889 Other specified disorders of kidney and ureter: Secondary | ICD-10-CM

## 2018-02-08 DIAGNOSIS — Z85828 Personal history of other malignant neoplasm of skin: Secondary | ICD-10-CM

## 2018-02-08 LAB — CBC WITH DIFFERENTIAL (CANCER CENTER ONLY)
ABS IMMATURE GRANULOCYTES: 0 10*3/uL (ref 0.00–0.07)
Basophils Absolute: 0 10*3/uL (ref 0.0–0.1)
Basophils Relative: 0 %
EOS PCT: 0 %
Eosinophils Absolute: 0 10*3/uL (ref 0.0–0.5)
HEMATOCRIT: 38.6 % (ref 36.0–46.0)
Hemoglobin: 12.5 g/dL (ref 12.0–15.0)
LYMPHS ABS: 85.2 10*3/uL — AB (ref 0.7–4.0)
LYMPHS PCT: 97 %
MCH: 30.8 pg (ref 26.0–34.0)
MCHC: 32.4 g/dL (ref 30.0–36.0)
MCV: 95.1 fL (ref 80.0–100.0)
MONO ABS: 0 10*3/uL — AB (ref 0.1–1.0)
Monocytes Relative: 0 %
Neutro Abs: 2.6 10*3/uL (ref 1.7–17.7)
Neutrophils Relative %: 3 %
PLATELETS: 173 10*3/uL (ref 150–400)
RBC: 4.06 MIL/uL (ref 3.87–5.11)
RDW: 15.2 % (ref 11.5–15.5)
WBC Count: 87.8 10*3/uL (ref 4.0–10.5)
nRBC: 0 % (ref 0.0–0.2)

## 2018-02-08 LAB — CMP (CANCER CENTER ONLY)
ALBUMIN: 4 g/dL (ref 3.5–5.0)
ALT: 20 U/L (ref 0–44)
AST: 16 U/L (ref 15–41)
Alkaline Phosphatase: 103 U/L (ref 38–126)
Anion gap: 8 (ref 5–15)
BUN: 17 mg/dL (ref 8–23)
CHLORIDE: 105 mmol/L (ref 98–111)
CO2: 28 mmol/L (ref 22–32)
Calcium: 9.5 mg/dL (ref 8.9–10.3)
Creatinine: 0.88 mg/dL (ref 0.44–1.00)
GFR, Est AFR Am: >60 mL/min (ref >60)
GFR, Estimated: >60 mL/min (ref >60)
GLUCOSE: 125 mg/dL — AB (ref 70–99)
Potassium: 4.6 mmol/L (ref 3.5–5.1)
SODIUM: 141 mmol/L (ref 135–145)
Total Bilirubin: 0.3 mg/dL (ref 0.3–1.2)
Total Protein: 6.8 g/dL (ref 6.5–8.1)

## 2018-02-08 NOTE — Telephone Encounter (Signed)
Gave avs and calendar ° °

## 2018-02-08 NOTE — Assessment & Plan Note (Signed)
Lymphocytosis is stable.  I did not appreciate any palpable lymphadenopathy.  However, she is at high risk of disease progression I plan to see her again in 3 months for further follow-up including imaging study

## 2018-02-08 NOTE — Assessment & Plan Note (Signed)
She had history of breast cancer and asymptomatic lung nodule noted on prior CT imaging I plan to repeat CT imaging of the chest for further evaluation

## 2018-02-08 NOTE — Assessment & Plan Note (Signed)
She was noted to have a kidney lesion and went to see urologist but she has declined further follow-up She denies hematuria I recommend repeat CT imaging of the abdomen for further evaluation either now or in 3 months and the patient prefers to delay until her next visit.

## 2018-02-08 NOTE — Progress Notes (Signed)
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  I did not appreciate any palpable lymphadenopathy.  However, she is at high risk of disease progression I plan to see her again in 3 months for further follow-up including imaging study  History of breast cancer She had history of breast cancer and asymptomatic lung nodule noted on prior CT imaging I plan to repeat CT imaging of the chest for further evaluation  Mass of right kidney She was noted to have a kidney lesion and went to see urologist but she has declined further follow-up She denies hematuria I recommend repeat CT imaging of the abdomen for further evaluation either now or in 3 months and the patient prefers to delay until her next visit.   Orders Placed This Encounter  Procedures  . CT CHEST W CONTRAST    Standing Status:   Future    Standing Expiration Date:   03/15/2019    Order Specific Question:   If indicated for the ordered procedure, I authorize the administration of contrast media per Radiology protocol    Answer:   Yes    Order Specific Question:   Preferred imaging location?    Answer:   [facility]    Order Specific Question:   Radiology Contrast Protocol - do NOT remove file path    Answer:   \\[email or web address]  . CT ABDOMEN PELVIS W CONTRAST    Standing Status:   Future    Standing Expiration Date:   03/15/2019    Order Specific Question:   If indicated for the ordered procedure, I authorize the administration of contrast media per Radiology protocol    Answer:   Yes    Order Specific Question:   Preferred imaging location?    Answer:   [facility]    Order Specific Question:   Radiology Contrast Protocol - do NOT remove file path    Answer:   \\[email or web address].[email or web address]  .  Comprehensive metabolic panel    Standing Status:   Future    Standing Expiration Date:   03/15/2019  . CBC with Differential/Platelet    Standing Status:   Future    Standing Expiration Date:   03/15/2019  . Lactate dehydrogenase    Standing Status:   Future    Standing Expiration Date:   03/15/2019    INTERVAL HISTORY: Please see below for problem oriented charting. She returns for further follow-up She denies recent infection No new lymphadenopathy She denies recent skin lesion She was referred to see urologist from a previous visit and had an MRI done.  She did not follow-up since then She denies flank pain, changes in urination or hematuria. She denies palpable chest wall masses.  No recent cough. SUMMARY OF ONCOLOGIC HISTORY:   CLL (chronic lymphocytic leukemia) ([place])   02/12/2017 Pathology Results    FISH is positive for trisomy 12    02/12/2017 Pathology Results    Flow cytometry: The overall findings favor chronic lymphocytic leukemia    02/19/2017 Imaging    1. No mass or adenopathy identified within the chest, abdomen or pelvis. 2. The spleen is normal in size and appearance 3. Status post left mastectomy and left axillary nodal dissection. 4. Tiny nodule in the left upper lobe measures 3 mm. No follow-up needed if patient is low-risk. Non-contrast chest CT can be considered in  12 months if patient is high-risk.  5. Aortic Atherosclerosis (ICD10-I70.0). 6. There is a small indeterminate intermediate attenuating lesion arising from the right kidney. This could represent a hemorrhagic cyst or solid enhancing neoplasm. More definitive assessment could be obtained with renal protocol MRI.     REVIEW OF SYSTEMS:   Constitutional: Denies fevers, chills or abnormal weight loss Eyes: Denies blurriness of vision Ears, nose, mouth, throat, and face: Denies mucositis or sore throat Respiratory: Denies cough, dyspnea or wheezes Cardiovascular: Denies palpitation, chest  discomfort or lower extremity swelling Gastrointestinal:  Denies nausea, heartburn or change in bowel habits Skin: Denies abnormal skin rashes Lymphatics: Denies new lymphadenopathy or easy bruising Neurological:Denies numbness, tingling or new weaknesses Behavioral/Psych: Mood is stable, no new changes  All other systems were reviewed with the patient and are negative.  I have reviewed the past medical history, past surgical history, social history and family history with the patient and they are unchanged from previous note.  ALLERGIES:  is allergic to ciprofloxacin and quinolones.  MEDICATIONS:  Current Outpatient Medications  Medication Sig Dispense Refill  . aspirin EC 81 MG EC tablet Take 1 tablet (81 mg total) by mouth daily. 30 tablet 0  . buPROPion (WELLBUTRIN XL) 150 MG 24 hr tablet Take 150 mg by mouth daily.    . carvedilol (COREG) 6.25 MG tablet Take 1 tablet (6.25 mg total) by mouth 2 (two) times daily with a meal. 60 tablet 0  . Cholecalciferol (VITAMIN D PO) Take 1,000 Units by mouth daily.    . Cyanocobalamin (VITAMIN B-12 PO) Take 1 tablet by mouth daily.    . diclofenac (VOLTAREN) 75 MG EC tablet TAKE 1 TABLET (75 MG TOTAL) BY MOUTH 2 (TWO) TIMES DAILY. TAKE WITH FOOD 60 tablet 0  . Docusate Calcium (STOOL SOFTENER PO) Take 1 tablet by mouth daily as needed (CONSTIPATION).    . fluticasone (FLONASE) 50 MCG/ACT nasal spray Place 1 spray into both nostrils daily as needed for allergies or rhinitis.    Marland Kitchen ketorolac (TORADOL) 10 MG tablet Take 1 tablet (10 mg total) by mouth every 6 (six) hours as needed. 20 tablet 0  . LORazepam (ATIVAN) 1 MG tablet Take 1 mg by mouth at bedtime.     Marland Kitchen losartan (COZAAR) 100 MG tablet Take 100 mg by mouth daily.     . methocarbamol (ROBAXIN) 500 MG tablet Take 1 tablet (500 mg total) by mouth every 6 (six) hours as needed for muscle spasms. (Patient not taking: Reported on 02/20/2017) 60 tablet 0  . Multiple Vitamins-Calcium (ONE-A-DAY WOMENS  PO) Take 1 tablet by mouth daily.    . mupirocin ointment (BACTROBAN) 2 % Apply 1 application topically daily.    . nitroGLYCERIN (NITROSTAT) 0.4 MG SL tablet Place 1 tablet (0.4 mg total) under the tongue every 5 (five) minutes as needed for chest pain. 100 tablet 0  . omeprazole (PRILOSEC OTC) 20 MG tablet Take 20 mg by mouth at bedtime.     Marland Kitchen zolpidem (AMBIEN) 10 MG tablet Take 15 mg by mouth at bedtime as needed for sleep.      No current facility-administered medications for this visit.     PHYSICAL EXAMINATION: ECOG PERFORMANCE STATUS: 1 - Symptomatic but completely ambulatory  Vitals:   02/08/18 1220  BP: (!) 144/89  Pulse: 75  Resp: 18  Temp: 97.7 F (36.5 C)  SpO2: 97%   Filed Weights   02/08/18 1220  Weight: 173 lb 9.6 oz (78.7 kg)  GENERAL:alert, no distress and comfortable SKIN: skin color, texture, turgor are normal, no rashes or significant lesions EYES: normal, Conjunctiva are pink and non-injected, sclera clear OROPHARYNX:no exudate, no erythema and lips, buccal mucosa, and tongue normal  NECK: supple, thyroid normal size, non-tender, without nodularity LYMPH:  no palpable lymphadenopathy in the cervical, axillary or inguinal LUNGS: clear to auscultation and percussion with normal breathing effort HEART: regular rate & rhythm and no murmurs and no lower extremity edema ABDOMEN:abdomen soft, non-tender and normal bowel sounds Musculoskeletal:no cyanosis of digits and no clubbing  NEURO: alert & oriented x 3 with fluent speech, no focal motor/sensory deficits  LABORATORY DATA:  I have reviewed the data as listed    Component Value Date/Time   NA 141 02/08/2018 1147   K 4.6 02/08/2018 1147   CL 105 02/08/2018 1147   CO2 28 02/08/2018 1147   GLUCOSE 125 (H) 02/08/2018 1147   BUN 17 02/08/2018 1147   CREATININE 0.88 02/08/2018 1147   CALCIUM 9.5 02/08/2018 1147   PROT 6.8 02/08/2018 1147   ALBUMIN 4.0 02/08/2018 1147   AST 16 02/08/2018 1147   ALT 20  02/08/2018 1147   ALKPHOS 103 02/08/2018 1147   BILITOT 0.3 02/08/2018 1147   GFRNONAA >60 02/08/2018 1147   GFRAA >60 02/08/2018 1147    No results found for: SPEP, UPEP  Lab Results  Component Value Date   WBC 87.8 (HH) 02/08/2018   NEUTROABS 2.6 02/08/2018   HGB 12.5 02/08/2018   HCT 38.6 02/08/2018   MCV 95.1 02/08/2018   PLT 173 02/08/2018      Chemistry      Component Value Date/Time   NA 141 02/08/2018 1147   K 4.6 02/08/2018 1147   CL 105 02/08/2018 1147   CO2 28 02/08/2018 1147   BUN 17 02/08/2018 1147   CREATININE 0.88 02/08/2018 1147      Component Value Date/Time   CALCIUM 9.5 02/08/2018 1147   ALKPHOS 103 02/08/2018 1147   AST 16 02/08/2018 1147   ALT 20 02/08/2018 1147   BILITOT 0.3 02/08/2018 1147      All questions were answered. The patient knows to call the clinic with any problems, questions or concerns. No barriers to learning was detected.  I spent 15 minutes counseling the patient face to face. The total time spent in the appointment was 20 minutes and more than 50% was on counseling and review of test results  Heath Lark, MD 02/08/2018 5:24 PM

## 2018-02-12 DIAGNOSIS — G4733 Obstructive sleep apnea (adult) (pediatric): Secondary | ICD-10-CM | POA: Diagnosis not present

## 2018-02-14 DIAGNOSIS — F331 Major depressive disorder, recurrent, moderate: Secondary | ICD-10-CM | POA: Diagnosis not present

## 2018-02-19 DIAGNOSIS — H52202 Unspecified astigmatism, left eye: Secondary | ICD-10-CM | POA: Diagnosis not present

## 2018-02-19 DIAGNOSIS — H5712 Ocular pain, left eye: Secondary | ICD-10-CM | POA: Diagnosis not present

## 2018-02-19 DIAGNOSIS — H2511 Age-related nuclear cataract, right eye: Secondary | ICD-10-CM | POA: Diagnosis not present

## 2018-02-19 DIAGNOSIS — H04123 Dry eye syndrome of bilateral lacrimal glands: Secondary | ICD-10-CM | POA: Diagnosis not present

## 2018-02-22 DIAGNOSIS — L570 Actinic keratosis: Secondary | ICD-10-CM | POA: Diagnosis not present

## 2018-02-22 DIAGNOSIS — L853 Xerosis cutis: Secondary | ICD-10-CM | POA: Diagnosis not present

## 2018-02-22 DIAGNOSIS — Z85828 Personal history of other malignant neoplasm of skin: Secondary | ICD-10-CM | POA: Diagnosis not present

## 2018-02-22 DIAGNOSIS — L821 Other seborrheic keratosis: Secondary | ICD-10-CM | POA: Diagnosis not present

## 2018-03-13 DIAGNOSIS — H52209 Unspecified astigmatism, unspecified eye: Secondary | ICD-10-CM | POA: Diagnosis not present

## 2018-03-13 DIAGNOSIS — H524 Presbyopia: Secondary | ICD-10-CM | POA: Diagnosis not present

## 2018-03-13 DIAGNOSIS — H5213 Myopia, bilateral: Secondary | ICD-10-CM | POA: Diagnosis not present

## 2018-03-15 DIAGNOSIS — G4733 Obstructive sleep apnea (adult) (pediatric): Secondary | ICD-10-CM | POA: Diagnosis not present

## 2018-03-20 DIAGNOSIS — F5101 Primary insomnia: Secondary | ICD-10-CM | POA: Diagnosis not present

## 2018-03-20 DIAGNOSIS — F332 Major depressive disorder, recurrent severe without psychotic features: Secondary | ICD-10-CM | POA: Diagnosis not present

## 2018-03-20 DIAGNOSIS — F411 Generalized anxiety disorder: Secondary | ICD-10-CM | POA: Diagnosis not present

## 2018-03-29 DIAGNOSIS — F331 Major depressive disorder, recurrent, moderate: Secondary | ICD-10-CM | POA: Diagnosis not present

## 2018-04-01 DIAGNOSIS — F331 Major depressive disorder, recurrent, moderate: Secondary | ICD-10-CM | POA: Diagnosis not present

## 2018-04-01 DIAGNOSIS — F5101 Primary insomnia: Secondary | ICD-10-CM | POA: Diagnosis not present

## 2018-04-13 DIAGNOSIS — G4733 Obstructive sleep apnea (adult) (pediatric): Secondary | ICD-10-CM | POA: Diagnosis not present

## 2018-04-17 DIAGNOSIS — F331 Major depressive disorder, recurrent, moderate: Secondary | ICD-10-CM | POA: Diagnosis not present

## 2018-04-19 DIAGNOSIS — F331 Major depressive disorder, recurrent, moderate: Secondary | ICD-10-CM | POA: Diagnosis not present

## 2018-04-24 ENCOUNTER — Telehealth: Payer: Self-pay

## 2018-04-24 DIAGNOSIS — I10 Essential (primary) hypertension: Secondary | ICD-10-CM | POA: Diagnosis not present

## 2018-04-24 DIAGNOSIS — F331 Major depressive disorder, recurrent, moderate: Secondary | ICD-10-CM | POA: Diagnosis not present

## 2018-04-24 DIAGNOSIS — M5416 Radiculopathy, lumbar region: Secondary | ICD-10-CM | POA: Diagnosis not present

## 2018-04-24 DIAGNOSIS — C9110 Chronic lymphocytic leukemia of B-cell type not having achieved remission: Secondary | ICD-10-CM | POA: Diagnosis not present

## 2018-04-24 DIAGNOSIS — E1169 Type 2 diabetes mellitus with other specified complication: Secondary | ICD-10-CM | POA: Diagnosis not present

## 2018-04-24 DIAGNOSIS — N2889 Other specified disorders of kidney and ureter: Secondary | ICD-10-CM | POA: Diagnosis not present

## 2018-04-24 NOTE — Telephone Encounter (Signed)
Called and given below message. She verbalized understanding. She is doing well and is fine with moving appt out to July. Scheduling message sent.

## 2018-04-24 NOTE — Telephone Encounter (Signed)
-----   Message from Heath Lark, MD sent at 04/24/2018  9:51 AM EDT ----- Regarding: reschedule to July Pls call her. If she feels fine, move her appt with labs to July

## 2018-04-26 ENCOUNTER — Telehealth: Payer: Self-pay | Admitting: Hematology and Oncology

## 2018-04-26 NOTE — Telephone Encounter (Signed)
Scheduled per 4/1 sch msg. Called and spoke with patient. Confirmed dates and times of appts.

## 2018-04-27 DIAGNOSIS — C9110 Chronic lymphocytic leukemia of B-cell type not having achieved remission: Secondary | ICD-10-CM | POA: Diagnosis not present

## 2018-04-27 DIAGNOSIS — M1990 Unspecified osteoarthritis, unspecified site: Secondary | ICD-10-CM | POA: Diagnosis not present

## 2018-04-27 DIAGNOSIS — F331 Major depressive disorder, recurrent, moderate: Secondary | ICD-10-CM | POA: Diagnosis not present

## 2018-04-27 DIAGNOSIS — M5416 Radiculopathy, lumbar region: Secondary | ICD-10-CM | POA: Diagnosis not present

## 2018-04-27 DIAGNOSIS — I10 Essential (primary) hypertension: Secondary | ICD-10-CM | POA: Diagnosis not present

## 2018-04-27 DIAGNOSIS — I872 Venous insufficiency (chronic) (peripheral): Secondary | ICD-10-CM | POA: Diagnosis not present

## 2018-04-27 DIAGNOSIS — J45909 Unspecified asthma, uncomplicated: Secondary | ICD-10-CM | POA: Diagnosis not present

## 2018-04-27 DIAGNOSIS — K5790 Diverticulosis of intestine, part unspecified, without perforation or abscess without bleeding: Secondary | ICD-10-CM | POA: Diagnosis not present

## 2018-04-27 DIAGNOSIS — E119 Type 2 diabetes mellitus without complications: Secondary | ICD-10-CM | POA: Diagnosis not present

## 2018-04-29 DIAGNOSIS — M5416 Radiculopathy, lumbar region: Secondary | ICD-10-CM | POA: Diagnosis not present

## 2018-04-29 DIAGNOSIS — F331 Major depressive disorder, recurrent, moderate: Secondary | ICD-10-CM | POA: Diagnosis not present

## 2018-04-29 DIAGNOSIS — K5790 Diverticulosis of intestine, part unspecified, without perforation or abscess without bleeding: Secondary | ICD-10-CM | POA: Diagnosis not present

## 2018-04-29 DIAGNOSIS — I10 Essential (primary) hypertension: Secondary | ICD-10-CM | POA: Diagnosis not present

## 2018-04-29 DIAGNOSIS — E119 Type 2 diabetes mellitus without complications: Secondary | ICD-10-CM | POA: Diagnosis not present

## 2018-04-29 DIAGNOSIS — J45909 Unspecified asthma, uncomplicated: Secondary | ICD-10-CM | POA: Diagnosis not present

## 2018-04-29 DIAGNOSIS — C9110 Chronic lymphocytic leukemia of B-cell type not having achieved remission: Secondary | ICD-10-CM | POA: Diagnosis not present

## 2018-04-29 DIAGNOSIS — M1990 Unspecified osteoarthritis, unspecified site: Secondary | ICD-10-CM | POA: Diagnosis not present

## 2018-04-29 DIAGNOSIS — I872 Venous insufficiency (chronic) (peripheral): Secondary | ICD-10-CM | POA: Diagnosis not present

## 2018-05-01 DIAGNOSIS — M5416 Radiculopathy, lumbar region: Secondary | ICD-10-CM | POA: Diagnosis not present

## 2018-05-01 DIAGNOSIS — M1990 Unspecified osteoarthritis, unspecified site: Secondary | ICD-10-CM | POA: Diagnosis not present

## 2018-05-01 DIAGNOSIS — F331 Major depressive disorder, recurrent, moderate: Secondary | ICD-10-CM | POA: Diagnosis not present

## 2018-05-01 DIAGNOSIS — C9110 Chronic lymphocytic leukemia of B-cell type not having achieved remission: Secondary | ICD-10-CM | POA: Diagnosis not present

## 2018-05-01 DIAGNOSIS — E119 Type 2 diabetes mellitus without complications: Secondary | ICD-10-CM | POA: Diagnosis not present

## 2018-05-01 DIAGNOSIS — I872 Venous insufficiency (chronic) (peripheral): Secondary | ICD-10-CM | POA: Diagnosis not present

## 2018-05-01 DIAGNOSIS — J45909 Unspecified asthma, uncomplicated: Secondary | ICD-10-CM | POA: Diagnosis not present

## 2018-05-01 DIAGNOSIS — K5790 Diverticulosis of intestine, part unspecified, without perforation or abscess without bleeding: Secondary | ICD-10-CM | POA: Diagnosis not present

## 2018-05-01 DIAGNOSIS — I10 Essential (primary) hypertension: Secondary | ICD-10-CM | POA: Diagnosis not present

## 2018-05-03 DIAGNOSIS — M1990 Unspecified osteoarthritis, unspecified site: Secondary | ICD-10-CM | POA: Diagnosis not present

## 2018-05-03 DIAGNOSIS — M5416 Radiculopathy, lumbar region: Secondary | ICD-10-CM | POA: Diagnosis not present

## 2018-05-03 DIAGNOSIS — K5790 Diverticulosis of intestine, part unspecified, without perforation or abscess without bleeding: Secondary | ICD-10-CM | POA: Diagnosis not present

## 2018-05-03 DIAGNOSIS — I872 Venous insufficiency (chronic) (peripheral): Secondary | ICD-10-CM | POA: Diagnosis not present

## 2018-05-03 DIAGNOSIS — F331 Major depressive disorder, recurrent, moderate: Secondary | ICD-10-CM | POA: Diagnosis not present

## 2018-05-03 DIAGNOSIS — I10 Essential (primary) hypertension: Secondary | ICD-10-CM | POA: Diagnosis not present

## 2018-05-03 DIAGNOSIS — E119 Type 2 diabetes mellitus without complications: Secondary | ICD-10-CM | POA: Diagnosis not present

## 2018-05-03 DIAGNOSIS — C9110 Chronic lymphocytic leukemia of B-cell type not having achieved remission: Secondary | ICD-10-CM | POA: Diagnosis not present

## 2018-05-03 DIAGNOSIS — J45909 Unspecified asthma, uncomplicated: Secondary | ICD-10-CM | POA: Diagnosis not present

## 2018-05-06 DIAGNOSIS — F331 Major depressive disorder, recurrent, moderate: Secondary | ICD-10-CM | POA: Diagnosis not present

## 2018-05-07 DIAGNOSIS — K5790 Diverticulosis of intestine, part unspecified, without perforation or abscess without bleeding: Secondary | ICD-10-CM | POA: Diagnosis not present

## 2018-05-07 DIAGNOSIS — M1990 Unspecified osteoarthritis, unspecified site: Secondary | ICD-10-CM | POA: Diagnosis not present

## 2018-05-07 DIAGNOSIS — M5416 Radiculopathy, lumbar region: Secondary | ICD-10-CM | POA: Diagnosis not present

## 2018-05-07 DIAGNOSIS — J45909 Unspecified asthma, uncomplicated: Secondary | ICD-10-CM | POA: Diagnosis not present

## 2018-05-07 DIAGNOSIS — E119 Type 2 diabetes mellitus without complications: Secondary | ICD-10-CM | POA: Diagnosis not present

## 2018-05-07 DIAGNOSIS — F331 Major depressive disorder, recurrent, moderate: Secondary | ICD-10-CM | POA: Diagnosis not present

## 2018-05-07 DIAGNOSIS — I872 Venous insufficiency (chronic) (peripheral): Secondary | ICD-10-CM | POA: Diagnosis not present

## 2018-05-07 DIAGNOSIS — C9110 Chronic lymphocytic leukemia of B-cell type not having achieved remission: Secondary | ICD-10-CM | POA: Diagnosis not present

## 2018-05-07 DIAGNOSIS — I10 Essential (primary) hypertension: Secondary | ICD-10-CM | POA: Diagnosis not present

## 2018-05-08 DIAGNOSIS — J45909 Unspecified asthma, uncomplicated: Secondary | ICD-10-CM | POA: Diagnosis not present

## 2018-05-08 DIAGNOSIS — M5416 Radiculopathy, lumbar region: Secondary | ICD-10-CM | POA: Diagnosis not present

## 2018-05-08 DIAGNOSIS — K5790 Diverticulosis of intestine, part unspecified, without perforation or abscess without bleeding: Secondary | ICD-10-CM | POA: Diagnosis not present

## 2018-05-08 DIAGNOSIS — C9110 Chronic lymphocytic leukemia of B-cell type not having achieved remission: Secondary | ICD-10-CM | POA: Diagnosis not present

## 2018-05-08 DIAGNOSIS — M1990 Unspecified osteoarthritis, unspecified site: Secondary | ICD-10-CM | POA: Diagnosis not present

## 2018-05-08 DIAGNOSIS — I872 Venous insufficiency (chronic) (peripheral): Secondary | ICD-10-CM | POA: Diagnosis not present

## 2018-05-08 DIAGNOSIS — I10 Essential (primary) hypertension: Secondary | ICD-10-CM | POA: Diagnosis not present

## 2018-05-08 DIAGNOSIS — F331 Major depressive disorder, recurrent, moderate: Secondary | ICD-10-CM | POA: Diagnosis not present

## 2018-05-08 DIAGNOSIS — E119 Type 2 diabetes mellitus without complications: Secondary | ICD-10-CM | POA: Diagnosis not present

## 2018-05-09 ENCOUNTER — Other Ambulatory Visit: Payer: Self-pay

## 2018-05-09 DIAGNOSIS — F331 Major depressive disorder, recurrent, moderate: Secondary | ICD-10-CM | POA: Diagnosis not present

## 2018-05-09 DIAGNOSIS — E119 Type 2 diabetes mellitus without complications: Secondary | ICD-10-CM | POA: Diagnosis not present

## 2018-05-09 DIAGNOSIS — M5416 Radiculopathy, lumbar region: Secondary | ICD-10-CM | POA: Diagnosis not present

## 2018-05-09 DIAGNOSIS — J45909 Unspecified asthma, uncomplicated: Secondary | ICD-10-CM | POA: Diagnosis not present

## 2018-05-09 DIAGNOSIS — I872 Venous insufficiency (chronic) (peripheral): Secondary | ICD-10-CM | POA: Diagnosis not present

## 2018-05-09 DIAGNOSIS — M1990 Unspecified osteoarthritis, unspecified site: Secondary | ICD-10-CM | POA: Diagnosis not present

## 2018-05-09 DIAGNOSIS — C9110 Chronic lymphocytic leukemia of B-cell type not having achieved remission: Secondary | ICD-10-CM | POA: Diagnosis not present

## 2018-05-09 DIAGNOSIS — K5790 Diverticulosis of intestine, part unspecified, without perforation or abscess without bleeding: Secondary | ICD-10-CM | POA: Diagnosis not present

## 2018-05-09 DIAGNOSIS — I10 Essential (primary) hypertension: Secondary | ICD-10-CM | POA: Diagnosis not present

## 2018-05-10 ENCOUNTER — Ambulatory Visit: Payer: Self-pay | Admitting: Hematology and Oncology

## 2018-05-14 DIAGNOSIS — F331 Major depressive disorder, recurrent, moderate: Secondary | ICD-10-CM | POA: Diagnosis not present

## 2018-05-14 DIAGNOSIS — C9110 Chronic lymphocytic leukemia of B-cell type not having achieved remission: Secondary | ICD-10-CM | POA: Diagnosis not present

## 2018-05-14 DIAGNOSIS — K5790 Diverticulosis of intestine, part unspecified, without perforation or abscess without bleeding: Secondary | ICD-10-CM | POA: Diagnosis not present

## 2018-05-14 DIAGNOSIS — M5416 Radiculopathy, lumbar region: Secondary | ICD-10-CM | POA: Diagnosis not present

## 2018-05-14 DIAGNOSIS — J45909 Unspecified asthma, uncomplicated: Secondary | ICD-10-CM | POA: Diagnosis not present

## 2018-05-14 DIAGNOSIS — I10 Essential (primary) hypertension: Secondary | ICD-10-CM | POA: Diagnosis not present

## 2018-05-14 DIAGNOSIS — I872 Venous insufficiency (chronic) (peripheral): Secondary | ICD-10-CM | POA: Diagnosis not present

## 2018-05-14 DIAGNOSIS — M1990 Unspecified osteoarthritis, unspecified site: Secondary | ICD-10-CM | POA: Diagnosis not present

## 2018-05-14 DIAGNOSIS — E119 Type 2 diabetes mellitus without complications: Secondary | ICD-10-CM | POA: Diagnosis not present

## 2018-05-16 DIAGNOSIS — K5790 Diverticulosis of intestine, part unspecified, without perforation or abscess without bleeding: Secondary | ICD-10-CM | POA: Diagnosis not present

## 2018-05-16 DIAGNOSIS — I872 Venous insufficiency (chronic) (peripheral): Secondary | ICD-10-CM | POA: Diagnosis not present

## 2018-05-16 DIAGNOSIS — C9110 Chronic lymphocytic leukemia of B-cell type not having achieved remission: Secondary | ICD-10-CM | POA: Diagnosis not present

## 2018-05-16 DIAGNOSIS — M1990 Unspecified osteoarthritis, unspecified site: Secondary | ICD-10-CM | POA: Diagnosis not present

## 2018-05-16 DIAGNOSIS — I10 Essential (primary) hypertension: Secondary | ICD-10-CM | POA: Diagnosis not present

## 2018-05-16 DIAGNOSIS — F331 Major depressive disorder, recurrent, moderate: Secondary | ICD-10-CM | POA: Diagnosis not present

## 2018-05-16 DIAGNOSIS — J45909 Unspecified asthma, uncomplicated: Secondary | ICD-10-CM | POA: Diagnosis not present

## 2018-05-16 DIAGNOSIS — M5416 Radiculopathy, lumbar region: Secondary | ICD-10-CM | POA: Diagnosis not present

## 2018-05-16 DIAGNOSIS — E119 Type 2 diabetes mellitus without complications: Secondary | ICD-10-CM | POA: Diagnosis not present

## 2018-05-20 DIAGNOSIS — M1990 Unspecified osteoarthritis, unspecified site: Secondary | ICD-10-CM | POA: Diagnosis not present

## 2018-05-20 DIAGNOSIS — K5790 Diverticulosis of intestine, part unspecified, without perforation or abscess without bleeding: Secondary | ICD-10-CM | POA: Diagnosis not present

## 2018-05-20 DIAGNOSIS — F331 Major depressive disorder, recurrent, moderate: Secondary | ICD-10-CM | POA: Diagnosis not present

## 2018-05-20 DIAGNOSIS — M5416 Radiculopathy, lumbar region: Secondary | ICD-10-CM | POA: Diagnosis not present

## 2018-05-20 DIAGNOSIS — E119 Type 2 diabetes mellitus without complications: Secondary | ICD-10-CM | POA: Diagnosis not present

## 2018-05-20 DIAGNOSIS — I10 Essential (primary) hypertension: Secondary | ICD-10-CM | POA: Diagnosis not present

## 2018-05-20 DIAGNOSIS — I872 Venous insufficiency (chronic) (peripheral): Secondary | ICD-10-CM | POA: Diagnosis not present

## 2018-05-20 DIAGNOSIS — J45909 Unspecified asthma, uncomplicated: Secondary | ICD-10-CM | POA: Diagnosis not present

## 2018-05-20 DIAGNOSIS — C9110 Chronic lymphocytic leukemia of B-cell type not having achieved remission: Secondary | ICD-10-CM | POA: Diagnosis not present

## 2018-05-21 DIAGNOSIS — M5416 Radiculopathy, lumbar region: Secondary | ICD-10-CM | POA: Diagnosis not present

## 2018-05-21 DIAGNOSIS — J45909 Unspecified asthma, uncomplicated: Secondary | ICD-10-CM | POA: Diagnosis not present

## 2018-05-21 DIAGNOSIS — I10 Essential (primary) hypertension: Secondary | ICD-10-CM | POA: Diagnosis not present

## 2018-05-21 DIAGNOSIS — M1990 Unspecified osteoarthritis, unspecified site: Secondary | ICD-10-CM | POA: Diagnosis not present

## 2018-05-21 DIAGNOSIS — F331 Major depressive disorder, recurrent, moderate: Secondary | ICD-10-CM | POA: Diagnosis not present

## 2018-05-21 DIAGNOSIS — K5790 Diverticulosis of intestine, part unspecified, without perforation or abscess without bleeding: Secondary | ICD-10-CM | POA: Diagnosis not present

## 2018-05-21 DIAGNOSIS — E119 Type 2 diabetes mellitus without complications: Secondary | ICD-10-CM | POA: Diagnosis not present

## 2018-05-21 DIAGNOSIS — C9110 Chronic lymphocytic leukemia of B-cell type not having achieved remission: Secondary | ICD-10-CM | POA: Diagnosis not present

## 2018-05-21 DIAGNOSIS — I872 Venous insufficiency (chronic) (peripheral): Secondary | ICD-10-CM | POA: Diagnosis not present

## 2018-05-23 DIAGNOSIS — F331 Major depressive disorder, recurrent, moderate: Secondary | ICD-10-CM | POA: Diagnosis not present

## 2018-05-23 DIAGNOSIS — K5790 Diverticulosis of intestine, part unspecified, without perforation or abscess without bleeding: Secondary | ICD-10-CM | POA: Diagnosis not present

## 2018-05-23 DIAGNOSIS — J45909 Unspecified asthma, uncomplicated: Secondary | ICD-10-CM | POA: Diagnosis not present

## 2018-05-23 DIAGNOSIS — I10 Essential (primary) hypertension: Secondary | ICD-10-CM | POA: Diagnosis not present

## 2018-05-23 DIAGNOSIS — M5416 Radiculopathy, lumbar region: Secondary | ICD-10-CM | POA: Diagnosis not present

## 2018-05-23 DIAGNOSIS — C9110 Chronic lymphocytic leukemia of B-cell type not having achieved remission: Secondary | ICD-10-CM | POA: Diagnosis not present

## 2018-05-23 DIAGNOSIS — M1990 Unspecified osteoarthritis, unspecified site: Secondary | ICD-10-CM | POA: Diagnosis not present

## 2018-05-23 DIAGNOSIS — I872 Venous insufficiency (chronic) (peripheral): Secondary | ICD-10-CM | POA: Diagnosis not present

## 2018-05-23 DIAGNOSIS — E119 Type 2 diabetes mellitus without complications: Secondary | ICD-10-CM | POA: Diagnosis not present

## 2018-05-28 DIAGNOSIS — E119 Type 2 diabetes mellitus without complications: Secondary | ICD-10-CM | POA: Diagnosis not present

## 2018-05-28 DIAGNOSIS — I872 Venous insufficiency (chronic) (peripheral): Secondary | ICD-10-CM | POA: Diagnosis not present

## 2018-05-28 DIAGNOSIS — F331 Major depressive disorder, recurrent, moderate: Secondary | ICD-10-CM | POA: Diagnosis not present

## 2018-05-28 DIAGNOSIS — M5416 Radiculopathy, lumbar region: Secondary | ICD-10-CM | POA: Diagnosis not present

## 2018-05-28 DIAGNOSIS — M1990 Unspecified osteoarthritis, unspecified site: Secondary | ICD-10-CM | POA: Diagnosis not present

## 2018-05-28 DIAGNOSIS — I10 Essential (primary) hypertension: Secondary | ICD-10-CM | POA: Diagnosis not present

## 2018-05-28 DIAGNOSIS — C9110 Chronic lymphocytic leukemia of B-cell type not having achieved remission: Secondary | ICD-10-CM | POA: Diagnosis not present

## 2018-05-28 DIAGNOSIS — J45909 Unspecified asthma, uncomplicated: Secondary | ICD-10-CM | POA: Diagnosis not present

## 2018-05-28 DIAGNOSIS — K5790 Diverticulosis of intestine, part unspecified, without perforation or abscess without bleeding: Secondary | ICD-10-CM | POA: Diagnosis not present

## 2018-05-30 DIAGNOSIS — M5416 Radiculopathy, lumbar region: Secondary | ICD-10-CM | POA: Diagnosis not present

## 2018-05-30 DIAGNOSIS — K5790 Diverticulosis of intestine, part unspecified, without perforation or abscess without bleeding: Secondary | ICD-10-CM | POA: Diagnosis not present

## 2018-05-30 DIAGNOSIS — I872 Venous insufficiency (chronic) (peripheral): Secondary | ICD-10-CM | POA: Diagnosis not present

## 2018-05-30 DIAGNOSIS — M1990 Unspecified osteoarthritis, unspecified site: Secondary | ICD-10-CM | POA: Diagnosis not present

## 2018-05-30 DIAGNOSIS — E119 Type 2 diabetes mellitus without complications: Secondary | ICD-10-CM | POA: Diagnosis not present

## 2018-05-30 DIAGNOSIS — F331 Major depressive disorder, recurrent, moderate: Secondary | ICD-10-CM | POA: Diagnosis not present

## 2018-05-30 DIAGNOSIS — C9110 Chronic lymphocytic leukemia of B-cell type not having achieved remission: Secondary | ICD-10-CM | POA: Diagnosis not present

## 2018-05-30 DIAGNOSIS — I10 Essential (primary) hypertension: Secondary | ICD-10-CM | POA: Diagnosis not present

## 2018-05-30 DIAGNOSIS — J45909 Unspecified asthma, uncomplicated: Secondary | ICD-10-CM | POA: Diagnosis not present

## 2018-06-04 DIAGNOSIS — G4733 Obstructive sleep apnea (adult) (pediatric): Secondary | ICD-10-CM | POA: Diagnosis not present

## 2018-06-04 DIAGNOSIS — G4721 Circadian rhythm sleep disorder, delayed sleep phase type: Secondary | ICD-10-CM | POA: Diagnosis not present

## 2018-06-06 DIAGNOSIS — K5790 Diverticulosis of intestine, part unspecified, without perforation or abscess without bleeding: Secondary | ICD-10-CM | POA: Diagnosis not present

## 2018-06-06 DIAGNOSIS — J45909 Unspecified asthma, uncomplicated: Secondary | ICD-10-CM | POA: Diagnosis not present

## 2018-06-06 DIAGNOSIS — C9110 Chronic lymphocytic leukemia of B-cell type not having achieved remission: Secondary | ICD-10-CM | POA: Diagnosis not present

## 2018-06-06 DIAGNOSIS — F331 Major depressive disorder, recurrent, moderate: Secondary | ICD-10-CM | POA: Diagnosis not present

## 2018-06-06 DIAGNOSIS — I872 Venous insufficiency (chronic) (peripheral): Secondary | ICD-10-CM | POA: Diagnosis not present

## 2018-06-06 DIAGNOSIS — E119 Type 2 diabetes mellitus without complications: Secondary | ICD-10-CM | POA: Diagnosis not present

## 2018-06-06 DIAGNOSIS — M1990 Unspecified osteoarthritis, unspecified site: Secondary | ICD-10-CM | POA: Diagnosis not present

## 2018-06-06 DIAGNOSIS — I10 Essential (primary) hypertension: Secondary | ICD-10-CM | POA: Diagnosis not present

## 2018-06-06 DIAGNOSIS — M5416 Radiculopathy, lumbar region: Secondary | ICD-10-CM | POA: Diagnosis not present

## 2018-06-10 ENCOUNTER — Other Ambulatory Visit (INDEPENDENT_AMBULATORY_CARE_PROVIDER_SITE_OTHER): Payer: Self-pay | Admitting: Physical Medicine and Rehabilitation

## 2018-06-10 NOTE — Telephone Encounter (Signed)
Not sure if I can do a quick note and refill as well, but I want tot ask you if we have her on any schedule for f/up if not need to, or she needs to see about that meidcation from PCP

## 2018-06-10 NOTE — Telephone Encounter (Signed)
Please advise 

## 2018-06-11 DIAGNOSIS — F5101 Primary insomnia: Secondary | ICD-10-CM | POA: Diagnosis not present

## 2018-06-11 DIAGNOSIS — M1990 Unspecified osteoarthritis, unspecified site: Secondary | ICD-10-CM | POA: Diagnosis not present

## 2018-06-11 DIAGNOSIS — M5416 Radiculopathy, lumbar region: Secondary | ICD-10-CM | POA: Diagnosis not present

## 2018-06-11 DIAGNOSIS — E119 Type 2 diabetes mellitus without complications: Secondary | ICD-10-CM | POA: Diagnosis not present

## 2018-06-11 DIAGNOSIS — C9110 Chronic lymphocytic leukemia of B-cell type not having achieved remission: Secondary | ICD-10-CM | POA: Diagnosis not present

## 2018-06-11 DIAGNOSIS — K5790 Diverticulosis of intestine, part unspecified, without perforation or abscess without bleeding: Secondary | ICD-10-CM | POA: Diagnosis not present

## 2018-06-11 DIAGNOSIS — I10 Essential (primary) hypertension: Secondary | ICD-10-CM | POA: Diagnosis not present

## 2018-06-11 DIAGNOSIS — I872 Venous insufficiency (chronic) (peripheral): Secondary | ICD-10-CM | POA: Diagnosis not present

## 2018-06-11 DIAGNOSIS — J45909 Unspecified asthma, uncomplicated: Secondary | ICD-10-CM | POA: Diagnosis not present

## 2018-06-11 DIAGNOSIS — F331 Major depressive disorder, recurrent, moderate: Secondary | ICD-10-CM | POA: Diagnosis not present

## 2018-06-13 DIAGNOSIS — E119 Type 2 diabetes mellitus without complications: Secondary | ICD-10-CM | POA: Diagnosis not present

## 2018-06-13 DIAGNOSIS — I872 Venous insufficiency (chronic) (peripheral): Secondary | ICD-10-CM | POA: Diagnosis not present

## 2018-06-13 DIAGNOSIS — K5790 Diverticulosis of intestine, part unspecified, without perforation or abscess without bleeding: Secondary | ICD-10-CM | POA: Diagnosis not present

## 2018-06-13 DIAGNOSIS — M1990 Unspecified osteoarthritis, unspecified site: Secondary | ICD-10-CM | POA: Diagnosis not present

## 2018-06-13 DIAGNOSIS — M5416 Radiculopathy, lumbar region: Secondary | ICD-10-CM | POA: Diagnosis not present

## 2018-06-13 DIAGNOSIS — C9110 Chronic lymphocytic leukemia of B-cell type not having achieved remission: Secondary | ICD-10-CM | POA: Diagnosis not present

## 2018-06-13 DIAGNOSIS — F331 Major depressive disorder, recurrent, moderate: Secondary | ICD-10-CM | POA: Diagnosis not present

## 2018-06-13 DIAGNOSIS — J45909 Unspecified asthma, uncomplicated: Secondary | ICD-10-CM | POA: Diagnosis not present

## 2018-06-13 DIAGNOSIS — I10 Essential (primary) hypertension: Secondary | ICD-10-CM | POA: Diagnosis not present

## 2018-06-14 DIAGNOSIS — M5416 Radiculopathy, lumbar region: Secondary | ICD-10-CM | POA: Diagnosis not present

## 2018-06-14 DIAGNOSIS — I872 Venous insufficiency (chronic) (peripheral): Secondary | ICD-10-CM | POA: Diagnosis not present

## 2018-06-14 DIAGNOSIS — I10 Essential (primary) hypertension: Secondary | ICD-10-CM | POA: Diagnosis not present

## 2018-06-14 DIAGNOSIS — C9110 Chronic lymphocytic leukemia of B-cell type not having achieved remission: Secondary | ICD-10-CM | POA: Diagnosis not present

## 2018-06-14 DIAGNOSIS — K5790 Diverticulosis of intestine, part unspecified, without perforation or abscess without bleeding: Secondary | ICD-10-CM | POA: Diagnosis not present

## 2018-06-14 DIAGNOSIS — M1990 Unspecified osteoarthritis, unspecified site: Secondary | ICD-10-CM | POA: Diagnosis not present

## 2018-06-14 DIAGNOSIS — F331 Major depressive disorder, recurrent, moderate: Secondary | ICD-10-CM | POA: Diagnosis not present

## 2018-06-14 DIAGNOSIS — J45909 Unspecified asthma, uncomplicated: Secondary | ICD-10-CM | POA: Diagnosis not present

## 2018-06-14 DIAGNOSIS — E119 Type 2 diabetes mellitus without complications: Secondary | ICD-10-CM | POA: Diagnosis not present

## 2018-06-20 DIAGNOSIS — M1990 Unspecified osteoarthritis, unspecified site: Secondary | ICD-10-CM | POA: Diagnosis not present

## 2018-06-20 DIAGNOSIS — K5790 Diverticulosis of intestine, part unspecified, without perforation or abscess without bleeding: Secondary | ICD-10-CM | POA: Diagnosis not present

## 2018-06-20 DIAGNOSIS — I10 Essential (primary) hypertension: Secondary | ICD-10-CM | POA: Diagnosis not present

## 2018-06-20 DIAGNOSIS — J45909 Unspecified asthma, uncomplicated: Secondary | ICD-10-CM | POA: Diagnosis not present

## 2018-06-20 DIAGNOSIS — E119 Type 2 diabetes mellitus without complications: Secondary | ICD-10-CM | POA: Diagnosis not present

## 2018-06-20 DIAGNOSIS — I872 Venous insufficiency (chronic) (peripheral): Secondary | ICD-10-CM | POA: Diagnosis not present

## 2018-06-20 DIAGNOSIS — F331 Major depressive disorder, recurrent, moderate: Secondary | ICD-10-CM | POA: Diagnosis not present

## 2018-06-20 DIAGNOSIS — M5416 Radiculopathy, lumbar region: Secondary | ICD-10-CM | POA: Diagnosis not present

## 2018-06-20 DIAGNOSIS — C9110 Chronic lymphocytic leukemia of B-cell type not having achieved remission: Secondary | ICD-10-CM | POA: Diagnosis not present

## 2018-06-26 DIAGNOSIS — F331 Major depressive disorder, recurrent, moderate: Secondary | ICD-10-CM | POA: Diagnosis not present

## 2018-07-09 DIAGNOSIS — F5101 Primary insomnia: Secondary | ICD-10-CM | POA: Diagnosis not present

## 2018-07-09 DIAGNOSIS — F331 Major depressive disorder, recurrent, moderate: Secondary | ICD-10-CM | POA: Diagnosis not present

## 2018-07-11 DIAGNOSIS — G4721 Circadian rhythm sleep disorder, delayed sleep phase type: Secondary | ICD-10-CM | POA: Diagnosis not present

## 2018-07-18 DIAGNOSIS — R69 Illness, unspecified: Secondary | ICD-10-CM | POA: Diagnosis not present

## 2018-07-24 DIAGNOSIS — F331 Major depressive disorder, recurrent, moderate: Secondary | ICD-10-CM | POA: Diagnosis not present

## 2018-08-08 ENCOUNTER — Inpatient Hospital Stay: Payer: Medicare HMO | Attending: Hematology and Oncology

## 2018-08-08 ENCOUNTER — Ambulatory Visit (HOSPITAL_COMMUNITY)
Admission: RE | Admit: 2018-08-08 | Discharge: 2018-08-08 | Disposition: A | Discharge status: Home / Self Care | Payer: Medicare HMO | Source: Ambulatory Visit | Attending: Hematology and Oncology | Admitting: Hematology and Oncology

## 2018-08-08 ENCOUNTER — Telehealth: Payer: Self-pay | Admitting: *Deleted

## 2018-08-08 ENCOUNTER — Other Ambulatory Visit: Payer: Self-pay

## 2018-08-08 DIAGNOSIS — C9110 Chronic lymphocytic leukemia of B-cell type not having achieved remission: Secondary | ICD-10-CM | POA: Insufficient documentation

## 2018-08-08 DIAGNOSIS — Z853 Personal history of malignant neoplasm of breast: Secondary | ICD-10-CM | POA: Insufficient documentation

## 2018-08-08 DIAGNOSIS — N2889 Other specified disorders of kidney and ureter: Secondary | ICD-10-CM | POA: Insufficient documentation

## 2018-08-08 DIAGNOSIS — Z85828 Personal history of other malignant neoplasm of skin: Secondary | ICD-10-CM | POA: Insufficient documentation

## 2018-08-08 DIAGNOSIS — J9811 Atelectasis: Secondary | ICD-10-CM | POA: Diagnosis not present

## 2018-08-08 DIAGNOSIS — Z9012 Acquired absence of left breast and nipple: Secondary | ICD-10-CM | POA: Insufficient documentation

## 2018-08-08 DIAGNOSIS — Z79899 Other long term (current) drug therapy: Secondary | ICD-10-CM | POA: Diagnosis not present

## 2018-08-08 DIAGNOSIS — K7689 Other specified diseases of liver: Secondary | ICD-10-CM | POA: Diagnosis not present

## 2018-08-08 LAB — CBC WITH DIFFERENTIAL/PLATELET
Abs Immature Granulocytes: 0.14 10*3/uL — ABNORMAL HIGH (ref 0.00–0.07)
Basophils Absolute: 0.1 10*3/uL (ref 0.0–0.1)
Basophils Relative: 0 %
Eosinophils Absolute: 0.4 10*3/uL (ref 0.0–0.5)
Eosinophils Relative: 0 %
HCT: 38.5 % (ref 36.0–46.0)
Hemoglobin: 12.1 g/dL (ref 12.0–15.0)
Immature Granulocytes: 0 %
Lymphocytes Relative: 85 %
Lymphs Abs: 85.1 10*3/uL — ABNORMAL HIGH (ref 0.7–4.0)
MCH: 30.5 pg (ref 26.0–34.0)
MCHC: 31.4 g/dL (ref 30.0–36.0)
MCV: 97 fL (ref 80.0–100.0)
Monocytes Absolute: 10.6 10*3/uL — ABNORMAL HIGH (ref 0.1–1.0)
Monocytes Relative: 11 %
Neutro Abs: 3.7 10*3/uL (ref 1.7–7.7)
Neutrophils Relative %: 4 %
Platelets: 201 10*3/uL (ref 150–400)
RBC: 3.97 MIL/uL (ref 3.87–5.11)
RDW: 15.3 % (ref 11.5–15.5)
WBC: 99.9 10*3/uL (ref 4.0–10.5)
nRBC: 0 % (ref 0.0–0.2)

## 2018-08-08 LAB — COMPREHENSIVE METABOLIC PANEL
ALT: 16 U/L (ref 0–44)
AST: 15 U/L (ref 15–41)
Albumin: 4 g/dL (ref 3.5–5.0)
Alkaline Phosphatase: 100 U/L (ref 38–126)
Anion gap: 9 (ref 5–15)
BUN: 20 mg/dL (ref 8–23)
CO2: 26 mmol/L (ref 22–32)
Calcium: 9.1 mg/dL (ref 8.9–10.3)
Chloride: 105 mmol/L (ref 98–111)
Creatinine, Ser: 0.92 mg/dL (ref 0.44–1.00)
GFR calc Af Amer: >60 mL/min (ref >60)
GFR calc non Af Amer: >60 mL/min (ref >60)
Glucose, Bld: 133 mg/dL — ABNORMAL HIGH (ref 70–99)
Potassium: 4.7 mmol/L (ref 3.5–5.1)
Sodium: 140 mmol/L (ref 135–145)
Total Bilirubin: 0.3 mg/dL (ref 0.3–1.2)
Total Protein: 6.6 g/dL (ref 6.5–8.1)

## 2018-08-08 LAB — LACTATE DEHYDROGENASE: LDH: 283 U/L — ABNORMAL HIGH (ref 98–192)

## 2018-08-08 MED ORDER — SODIUM CHLORIDE (PF) 0.9 % IJ SOLN
INTRAMUSCULAR | Status: AC
  Filled 2018-08-08: qty 50

## 2018-08-08 MED ORDER — IOHEXOL 300 MG/ML  SOLN
100.0000 mL | Freq: Once | INTRAMUSCULAR | Status: AC | PRN
  Administered 2018-08-08: 12:00:00 100 mL via INTRAVENOUS

## 2018-08-08 NOTE — Telephone Encounter (Signed)
"  [facility].  Dialed [phone] to provide call report for HIROKO TREGRE CT Abd/Pelvis performed today.  Follow up recommended for the following. "IMPRESSION: 1. Interval increase in size dense exophytic lesion off the inferior pole of the right kidney. This needs dedicated evaluation with pre and post contrast-enhanced CT or MRI to evaluate for the possibility of solid renal neoplasm."  I can call back in the morning if needed."  Next Scheduled F/U 08-09-2018 at 12:00 pm.

## 2018-08-09 ENCOUNTER — Other Ambulatory Visit: Payer: Self-pay

## 2018-08-09 ENCOUNTER — Inpatient Hospital Stay (HOSPITAL_BASED_OUTPATIENT_CLINIC_OR_DEPARTMENT_OTHER): Payer: Medicare HMO | Admitting: Hematology and Oncology

## 2018-08-09 ENCOUNTER — Encounter: Payer: Self-pay | Admitting: Hematology and Oncology

## 2018-08-09 ENCOUNTER — Telehealth: Payer: Self-pay

## 2018-08-09 DIAGNOSIS — Z9012 Acquired absence of left breast and nipple: Secondary | ICD-10-CM | POA: Diagnosis not present

## 2018-08-09 DIAGNOSIS — N2889 Other specified disorders of kidney and ureter: Secondary | ICD-10-CM

## 2018-08-09 DIAGNOSIS — C9110 Chronic lymphocytic leukemia of B-cell type not having achieved remission: Secondary | ICD-10-CM | POA: Diagnosis not present

## 2018-08-09 DIAGNOSIS — Z85828 Personal history of other malignant neoplasm of skin: Secondary | ICD-10-CM

## 2018-08-09 DIAGNOSIS — Z79899 Other long term (current) drug therapy: Secondary | ICD-10-CM

## 2018-08-09 DIAGNOSIS — Z853 Personal history of malignant neoplasm of breast: Secondary | ICD-10-CM

## 2018-08-09 NOTE — Assessment & Plan Note (Signed)
She will follow up closely with dermatologist

## 2018-08-09 NOTE — Progress Notes (Signed)
[place] OFFICE PROGRESS NOTE  Patient Care Team: Prince Solian, MD as PCP - General (Internal Medicine)  ASSESSMENT & PLAN:  CLL (chronic lymphocytic leukemia) ([place]) I have reviewed blood work and imaging studies with the patient She has no clinical progression of CLL The enlarging kidney mass could be related to kidney cancer I will set up return of appointment visit for her to follow-up with urologist for further management I will see her back again next year She is educated to watch for signs and symptoms of cancer progression We discussed the importance of annual influenza vaccination  Mass of right kidney I have reviewed imaging study with the patient We discussed the importance of follow-up with urologist and she agreed I will get my nurse to call her urologist to set up a return appointment Currently, she is not symptomatic  History of skin cancer She will follow up closely with dermatologist   No orders of the defined types were placed in this encounter.   INTERVAL HISTORY: Please see below for problem oriented charting. She returns for follow-up of CLL and history of skin cancer/breast cancer The patient is relocating to [facility] but will continue to follow-up. She denies new lymphadenopathy No recent infection, fever or chills Denies recent hematuria or flank pain .She denies any recent abnormal breast examination, palpable mass, abnormal breast appearance or nipple changes  SUMMARY OF ONCOLOGIC HISTORY: Oncology History  CLL (chronic lymphocytic leukemia) ([place])  02/12/2017 Pathology Results   FISH is positive for trisomy 12   02/12/2017 Pathology Results   Flow cytometry: The overall findings favor chronic lymphocytic leukemia   02/19/2017 Imaging   1. No mass or adenopathy identified within the chest, abdomen or pelvis. 2. The spleen is normal in size and appearance 3. Status post left mastectomy and left axillary nodal dissection. 4.  Tiny nodule in the left upper lobe measures 3 mm. No follow-up needed if patient is low-risk. Non-contrast chest CT can be considered in 12 months if patient is high-risk.  5. Aortic Atherosclerosis (ICD10-I70.0). 6. There is a small indeterminate intermediate attenuating lesion arising from the right kidney. This could represent a hemorrhagic cyst or solid enhancing neoplasm. More definitive assessment could be obtained with renal protocol MRI.   08/08/2018 Imaging   1. Interval increase in size dense exophytic lesion off the inferior pole of the right kidney. This needs dedicated evaluation with pre and post contrast-enhanced CT or MRI to evaluate for the possibility of solid renal neoplasm. 2. No adenopathy within the chest, abdomen or pelvis.     REVIEW OF SYSTEMS:   Constitutional: Denies fevers, chills or abnormal weight loss Eyes: Denies blurriness of vision Ears, nose, mouth, throat, and face: Denies mucositis or sore throat Respiratory: Denies cough, dyspnea or wheezes Cardiovascular: Denies palpitation, chest discomfort or lower extremity swelling Gastrointestinal:  Denies nausea, heartburn or change in bowel habits Skin: Denies abnormal skin rashes Lymphatics: Denies new lymphadenopathy or easy bruising Neurological:Denies numbness, tingling or new weaknesses Behavioral/Psych: Mood is stable, no new changes  All other systems were reviewed with the patient and are negative.  I have reviewed the past medical history, past surgical history, social history and family history with the patient and they are unchanged from previous note.  ALLERGIES:  is allergic to ciprofloxacin and quinolones.  MEDICATIONS:  Current Outpatient Medications  Medication Sig Dispense Refill  . aspirin EC 81 MG EC tablet Take 1 tablet (81 mg total) by mouth daily. 30 tablet 0  .  buPROPion (WELLBUTRIN XL) 150 MG 24 hr tablet Take 150 mg by mouth daily.    . carvedilol (COREG) 6.25 MG tablet Take 1  tablet (6.25 mg total) by mouth 2 (two) times daily with a meal. 60 tablet 0  . Cholecalciferol (VITAMIN D PO) Take 1,000 Units by mouth daily.    . Cyanocobalamin (VITAMIN B-12 PO) Take 1 tablet by mouth daily.    . diclofenac (VOLTAREN) 75 MG EC tablet TAKE 1 TABLET (75 MG TOTAL) BY MOUTH 2 (TWO) TIMES DAILY. TAKE WITH FOOD 60 tablet 0  . Docusate Calcium (STOOL SOFTENER PO) Take 1 tablet by mouth daily as needed (CONSTIPATION).    . fluticasone (FLONASE) 50 MCG/ACT nasal spray Place 1 spray into both nostrils daily as needed for allergies or rhinitis.    Marland Kitchen ketorolac (TORADOL) 10 MG tablet Take 1 tablet (10 mg total) by mouth every 6 (six) hours as needed. 20 tablet 0  . LORazepam (ATIVAN) 1 MG tablet Take 1 mg by mouth at bedtime.     Marland Kitchen losartan (COZAAR) 100 MG tablet Take 100 mg by mouth daily.     . methocarbamol (ROBAXIN) 500 MG tablet Take 1 tablet (500 mg total) by mouth every 6 (six) hours as needed for muscle spasms. (Patient not taking: Reported on 02/20/2017) 60 tablet 0  . Multiple Vitamins-Calcium (ONE-A-DAY WOMENS PO) Take 1 tablet by mouth daily.    . mupirocin ointment (BACTROBAN) 2 % Apply 1 application topically daily.    . nitroGLYCERIN (NITROSTAT) 0.4 MG SL tablet Place 1 tablet (0.4 mg total) under the tongue every 5 (five) minutes as needed for chest pain. 100 tablet 0  . omeprazole (PRILOSEC OTC) 20 MG tablet Take 20 mg by mouth at bedtime.     Marland Kitchen zolpidem (AMBIEN) 10 MG tablet Take 15 mg by mouth at bedtime as needed for sleep.      No current facility-administered medications for this visit.     PHYSICAL EXAMINATION: ECOG PERFORMANCE STATUS: 0 - Asymptomatic  Vitals:   08/09/18 1138  BP: (!) 119/59  Pulse: 80  Resp: 18  Temp: 98.5 F (36.9 C)  SpO2: 96%   Filed Weights   08/09/18 1138  Weight: 174 lb 12.8 oz (79.3 kg)    GENERAL:alert, no distress and comfortable Musculoskeletal:no cyanosis of digits and no clubbing  NEURO: alert & oriented x 3 with  fluent speech, no focal motor/sensory deficits  LABORATORY DATA:  I have reviewed the data as listed    Component Value Date/Time   NA 140 08/08/2018 1042   K 4.7 08/08/2018 1042   CL 105 08/08/2018 1042   CO2 26 08/08/2018 1042   GLUCOSE 133 (H) 08/08/2018 1042   BUN 20 08/08/2018 1042   CREATININE 0.92 08/08/2018 1042   CREATININE 0.88 02/08/2018 1147   CALCIUM 9.1 08/08/2018 1042   PROT 6.6 08/08/2018 1042   ALBUMIN 4.0 08/08/2018 1042   AST 15 08/08/2018 1042   AST 16 02/08/2018 1147   ALT 16 08/08/2018 1042   ALT 20 02/08/2018 1147   ALKPHOS 100 08/08/2018 1042   BILITOT 0.3 08/08/2018 1042   BILITOT 0.3 02/08/2018 1147   GFRNONAA >60 08/08/2018 1042   GFRNONAA >60 02/08/2018 1147   GFRAA >60 08/08/2018 1042   GFRAA >60 02/08/2018 1147    No results found for: SPEP, UPEP  Lab Results  Component Value Date   WBC 99.9 (HH) 08/08/2018   NEUTROABS 3.7 08/08/2018   HGB 12.1 08/08/2018  HCT 38.5 08/08/2018   MCV 97.0 08/08/2018   PLT 201 08/08/2018      Chemistry      Component Value Date/Time   NA 140 08/08/2018 1042   K 4.7 08/08/2018 1042   CL 105 08/08/2018 1042   CO2 26 08/08/2018 1042   BUN 20 08/08/2018 1042   CREATININE 0.92 08/08/2018 1042   CREATININE 0.88 02/08/2018 1147      Component Value Date/Time   CALCIUM 9.1 08/08/2018 1042   ALKPHOS 100 08/08/2018 1042   AST 15 08/08/2018 1042   AST 16 02/08/2018 1147   ALT 16 08/08/2018 1042   ALT 20 02/08/2018 1147   BILITOT 0.3 08/08/2018 1042   BILITOT 0.3 02/08/2018 1147       RADIOGRAPHIC STUDIES: I have reviewed multiple imaging studies with the patient I have personally reviewed the radiological images as listed and agreed with the findings in the report. Ct Chest W Contrast  Result Date: 08/08/2018 CLINICAL DATA:  Patient with history of CLL. EXAM: CT CHEST, ABDOMEN, AND PELVIS WITH CONTRAST TECHNIQUE: Multidetector CT imaging of the chest, abdomen and pelvis was performed following the  standard protocol during bolus administration of intravenous contrast. CONTRAST:  161mL OMNIPAQUE IOHEXOL 300 MG/ML  SOLN COMPARISON:  CT CAP 02/19/2017 FINDINGS: CT CHEST FINDINGS Cardiovascular: Normal heart size. Trace fluid superior pericardial recess. Aorta main pulmonary artery normal in caliber. Mediastinum/Nodes: No enlarged axillary, mediastinal or hilar lymphadenopathy. Moderate-sized hiatal hernia. Lungs/Pleura: Central airways are patent. Dependent atelectasis within the bilateral lower lobes. No large area pulmonary consolidation. No pleural effusion or pneumothorax. Title 2 mm nodule within the lingula (image 79; series 6) stable when compared to prior exam. No new or enlarging pulmonary nodules. Musculoskeletal: Thoracic spine degenerative changes. No aggressive or acute appearing osseous lesions. Patient status post left mastectomy and left axillary nodal dissection. CT ABDOMEN PELVIS FINDINGS Hepatobiliary: Similar-appearing hepatic cyst. Liver is normal in size and contour. Prior cholecystectomy. No intrahepatic or extrahepatic biliary ductal dilatation. Pancreas: Unremarkable Spleen: Unremarkable Adrenals/Urinary Tract: Stable thickening of the adrenal glands bilaterally. Interval increase in size of partially exophytic 1.5 cm lesion off the inferior pole of the right kidney (image 68; series 2) with an internal density of 45 Hounsfield units. Subcentimeter too small to characterize low-attenuation lesion mid pole left kidney (image 62; series 2). Kidneys enhance symmetrically with contrast. No hydronephrosis. Urinary bladder is poorly visualized secondary to streak artifact. Stomach/Bowel: Oral contrast material to the rectum. Sigmoid colonic diverticulosis. No CT evidence for acute diverticulitis. No evidence for small bowel obstruction. No free fluid or free intraperitoneal air. Vascular/Lymphatic: Normal caliber abdominal aorta. Peripheral calcified atherosclerotic plaque. No retroperitoneal  lymphadenopathy. Reproductive: Prior hysterectomy. Other: None. Musculoskeletal: Bilateral hip arthroplasties. Lumbar spine degenerative changes. No aggressive or acute appearing osseous lesions. IMPRESSION: 1. Interval increase in size dense exophytic lesion off the inferior pole of the right kidney. This needs dedicated evaluation with pre and post contrast-enhanced CT or MRI to evaluate for the possibility of solid renal neoplasm. 2. No adenopathy within the chest, abdomen or pelvis. 3. These results will be called to the ordering clinician or representative by the Radiologist Assistant, and communication documented in the PACS or zVision Dashboard. Electronically Signed   By: Lovey Newcomer M.D.   On: 08/08/2018 16:20   Ct Abdomen Pelvis W Contrast  Result Date: 08/08/2018 CLINICAL DATA:  Patient with history of CLL. EXAM: CT CHEST, ABDOMEN, AND PELVIS WITH CONTRAST TECHNIQUE: Multidetector CT imaging of the chest, abdomen and  pelvis was performed following the standard protocol during bolus administration of intravenous contrast. CONTRAST:  176mL OMNIPAQUE IOHEXOL 300 MG/ML  SOLN COMPARISON:  CT CAP 02/19/2017 FINDINGS: CT CHEST FINDINGS Cardiovascular: Normal heart size. Trace fluid superior pericardial recess. Aorta main pulmonary artery normal in caliber. Mediastinum/Nodes: No enlarged axillary, mediastinal or hilar lymphadenopathy. Moderate-sized hiatal hernia. Lungs/Pleura: Central airways are patent. Dependent atelectasis within the bilateral lower lobes. No large area pulmonary consolidation. No pleural effusion or pneumothorax. Title 2 mm nodule within the lingula (image 79; series 6) stable when compared to prior exam. No new or enlarging pulmonary nodules. Musculoskeletal: Thoracic spine degenerative changes. No aggressive or acute appearing osseous lesions. Patient status post left mastectomy and left axillary nodal dissection. CT ABDOMEN PELVIS FINDINGS Hepatobiliary: Similar-appearing hepatic  cyst. Liver is normal in size and contour. Prior cholecystectomy. No intrahepatic or extrahepatic biliary ductal dilatation. Pancreas: Unremarkable Spleen: Unremarkable Adrenals/Urinary Tract: Stable thickening of the adrenal glands bilaterally. Interval increase in size of partially exophytic 1.5 cm lesion off the inferior pole of the right kidney (image 68; series 2) with an internal density of 45 Hounsfield units. Subcentimeter too small to characterize low-attenuation lesion mid pole left kidney (image 62; series 2). Kidneys enhance symmetrically with contrast. No hydronephrosis. Urinary bladder is poorly visualized secondary to streak artifact. Stomach/Bowel: Oral contrast material to the rectum. Sigmoid colonic diverticulosis. No CT evidence for acute diverticulitis. No evidence for small bowel obstruction. No free fluid or free intraperitoneal air. Vascular/Lymphatic: Normal caliber abdominal aorta. Peripheral calcified atherosclerotic plaque. No retroperitoneal lymphadenopathy. Reproductive: Prior hysterectomy. Other: None. Musculoskeletal: Bilateral hip arthroplasties. Lumbar spine degenerative changes. No aggressive or acute appearing osseous lesions. IMPRESSION: 1. Interval increase in size dense exophytic lesion off the inferior pole of the right kidney. This needs dedicated evaluation with pre and post contrast-enhanced CT or MRI to evaluate for the possibility of solid renal neoplasm. 2. No adenopathy within the chest, abdomen or pelvis. 3. These results will be called to the ordering clinician or representative by the Radiologist Assistant, and communication documented in the PACS or zVision Dashboard. Electronically Signed   By: Lovey Newcomer M.D.   On: 08/08/2018 16:20    All questions were answered. The patient knows to call the clinic with any problems, questions or concerns. No barriers to learning was detected.  I spent 15 minutes counseling the patient face to face. The total time spent in  the appointment was 20 minutes and more than 50% was on counseling and review of test results  Heath Lark, MD 08/09/2018 12:34 PM

## 2018-08-09 NOTE — Assessment & Plan Note (Signed)
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

## 2018-08-09 NOTE — Assessment & Plan Note (Signed)
I have reviewed imaging study with the patient We discussed the importance of follow-up with urologist and she agreed I will get my nurse to call her urologist to set up a return appointment Currently, she is not symptomatic

## 2018-08-09 NOTE — Telephone Encounter (Signed)
Per DR Alvy Bimler appt made for pt at [facility]. Called pt and lvm with appt details. 7/31 at 12:30.

## 2018-08-12 ENCOUNTER — Telehealth: Payer: Self-pay | Admitting: Hematology and Oncology

## 2018-08-12 DIAGNOSIS — D49511 Neoplasm of unspecified behavior of right kidney: Secondary | ICD-10-CM | POA: Diagnosis not present

## 2018-08-12 NOTE — Telephone Encounter (Signed)
I talk with patient regarding schedule  

## 2018-08-13 DIAGNOSIS — G4721 Circadian rhythm sleep disorder, delayed sleep phase type: Secondary | ICD-10-CM | POA: Diagnosis not present

## 2018-08-14 ENCOUNTER — Other Ambulatory Visit (INDEPENDENT_AMBULATORY_CARE_PROVIDER_SITE_OTHER): Payer: Self-pay | Admitting: Physical Medicine and Rehabilitation

## 2018-08-16 NOTE — Telephone Encounter (Signed)
Patient states that this request should not have been sent to you. She has called her PCP to refill.

## 2018-08-19 DIAGNOSIS — F331 Major depressive disorder, recurrent, moderate: Secondary | ICD-10-CM | POA: Diagnosis not present

## 2018-09-13 DIAGNOSIS — F331 Major depressive disorder, recurrent, moderate: Secondary | ICD-10-CM | POA: Diagnosis not present

## 2018-09-24 DIAGNOSIS — F331 Major depressive disorder, recurrent, moderate: Secondary | ICD-10-CM | POA: Diagnosis not present

## 2018-09-24 DIAGNOSIS — M1990 Unspecified osteoarthritis, unspecified site: Secondary | ICD-10-CM | POA: Diagnosis not present

## 2018-09-24 DIAGNOSIS — E1169 Type 2 diabetes mellitus with other specified complication: Secondary | ICD-10-CM | POA: Diagnosis not present

## 2018-09-24 DIAGNOSIS — C9110 Chronic lymphocytic leukemia of B-cell type not having achieved remission: Secondary | ICD-10-CM | POA: Diagnosis not present

## 2018-09-24 DIAGNOSIS — Z1331 Encounter for screening for depression: Secondary | ICD-10-CM | POA: Diagnosis not present

## 2018-09-24 DIAGNOSIS — I10 Essential (primary) hypertension: Secondary | ICD-10-CM | POA: Diagnosis not present

## 2018-09-24 DIAGNOSIS — N2889 Other specified disorders of kidney and ureter: Secondary | ICD-10-CM | POA: Diagnosis not present

## 2018-09-24 DIAGNOSIS — M5416 Radiculopathy, lumbar region: Secondary | ICD-10-CM | POA: Diagnosis not present

## 2018-10-17 DIAGNOSIS — F331 Major depressive disorder, recurrent, moderate: Secondary | ICD-10-CM | POA: Diagnosis not present

## 2018-10-17 DIAGNOSIS — G4721 Circadian rhythm sleep disorder, delayed sleep phase type: Secondary | ICD-10-CM | POA: Diagnosis not present

## 2018-10-21 DIAGNOSIS — F331 Major depressive disorder, recurrent, moderate: Secondary | ICD-10-CM | POA: Diagnosis not present

## 2019-01-02 ENCOUNTER — Telehealth: Payer: Self-pay | Admitting: Hematology and Oncology

## 2019-01-02 NOTE — Telephone Encounter (Signed)
FAXED RECORDS TO [facility] RELEASE ID [id number]

## 2019-07-11 IMAGING — CT CT CHEST W/ CM
2 of 5 series · 12 of 36 positions shown, 15 images · IV contrast (ISOVUE 300)
Comparison: None.

CLINICAL DATA: Chronic lymphocytic leukemia.

EXAM:
CT CHEST, ABDOMEN, AND PELVIS WITH CONTRAST
TECHNIQUE: Multidetector CT imaging of the chest, abdomen and pelvis was
performed following the standard protocol during bolus
administration of intravenous contrast.
CONTRAST:  100mL M8SDXS-5BB IOPAMIDOL (M8SDXS-5BB) INJECTION 61%

[Series 2: cap with · axial · 0.97mm/px · z∈[-660,-174]mm · 9 of 123 slices shown, 12 images]
[im 13/123  mediastinal]
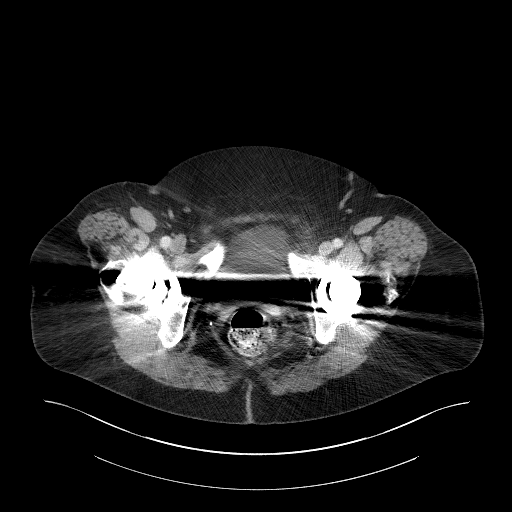
[im 13/123  lung]
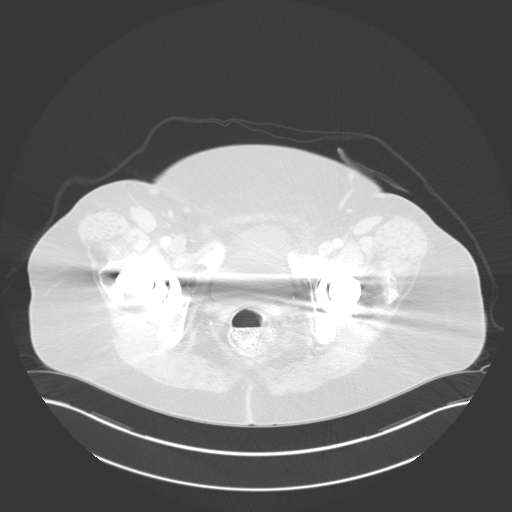
[im 25/123  lung]
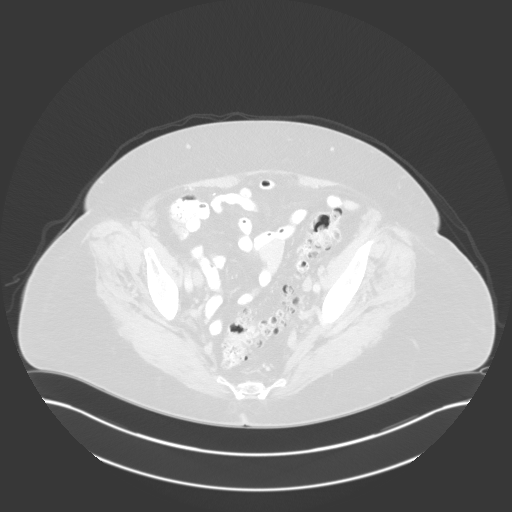
[im 37/123  lung]
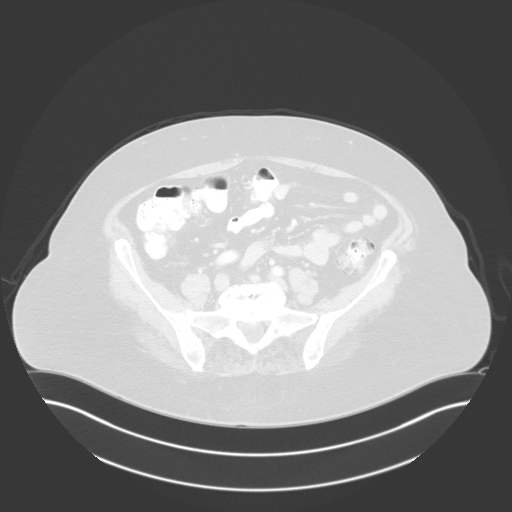
[im 49/123  lung]
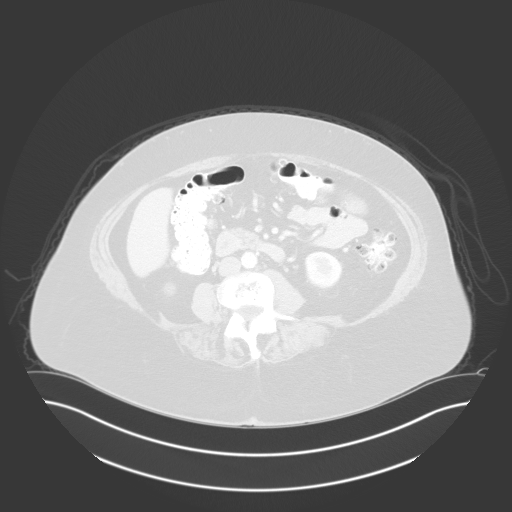
[im 62/123  mediastinal]
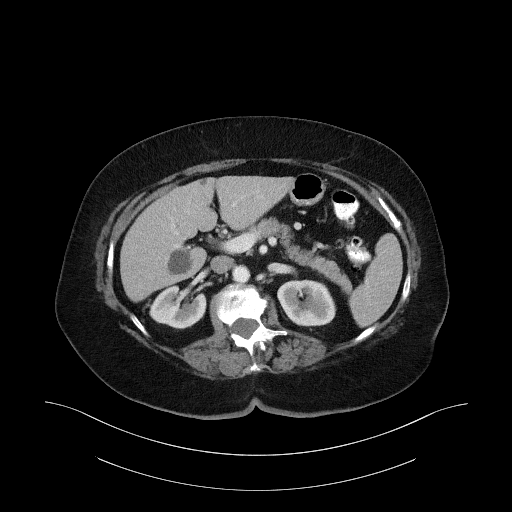
[im 62/123  lung]
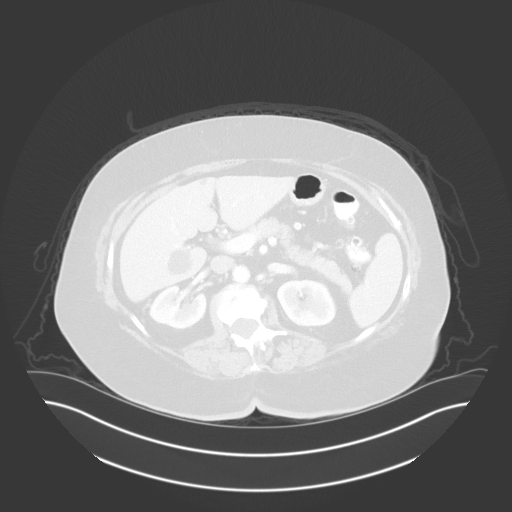
[im 74/123  lung]
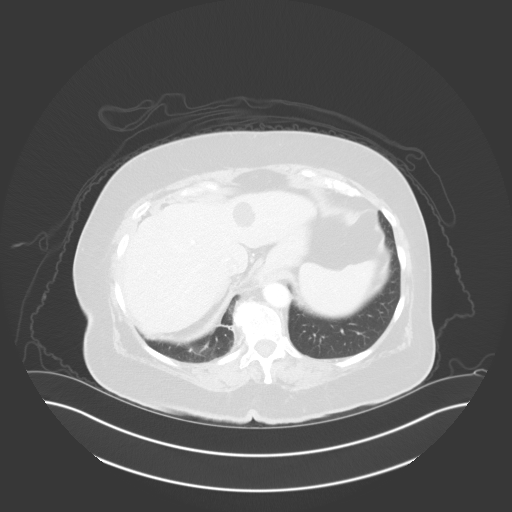
[im 86/123  lung]
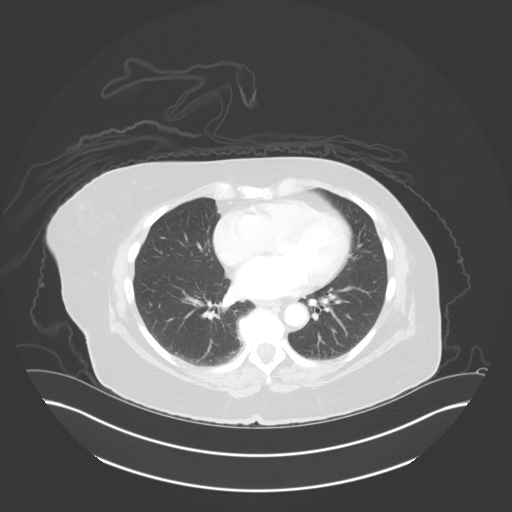
[im 98/123  lung]
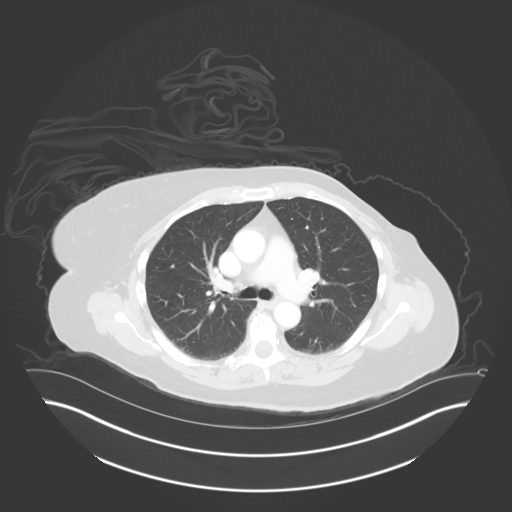
[im 110/123  mediastinal]
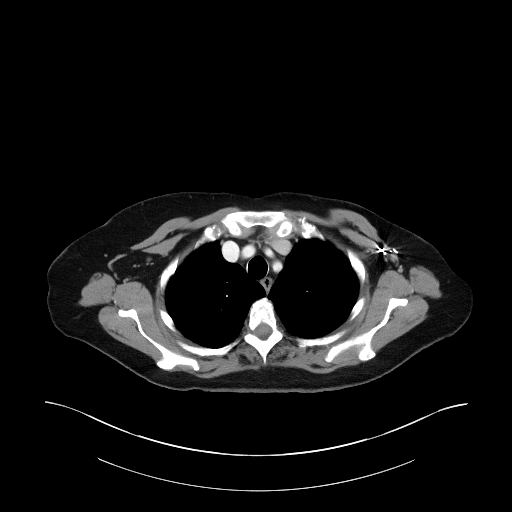
[im 110/123  lung]
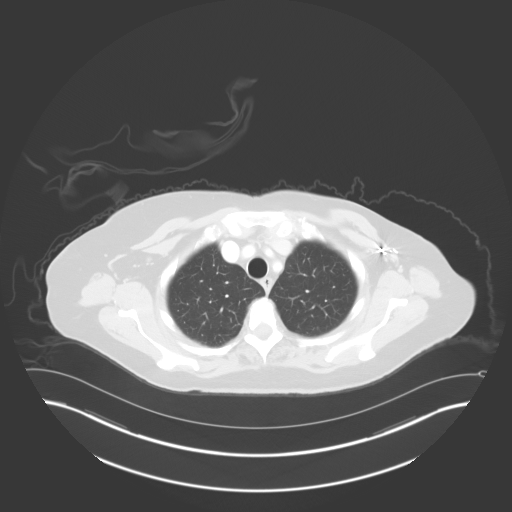

[Series 5: coronals · coronal · 0.82mm/px · 3 of 137 slices shown]
[im 28/137  lung]
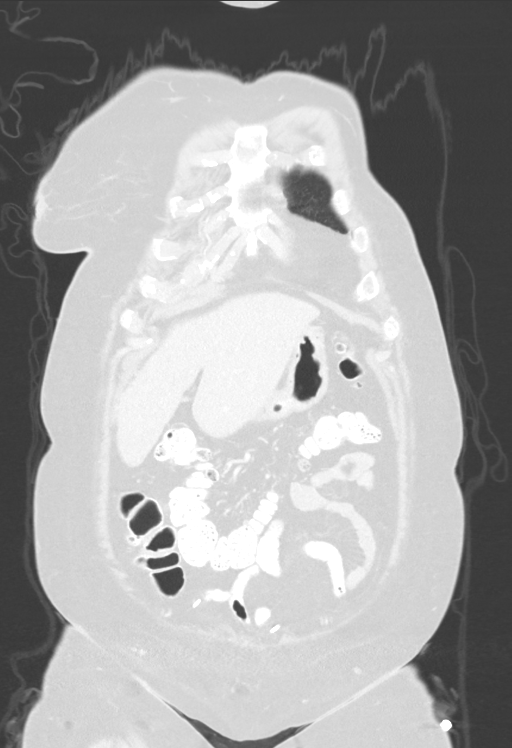
[im 55/137  lung]
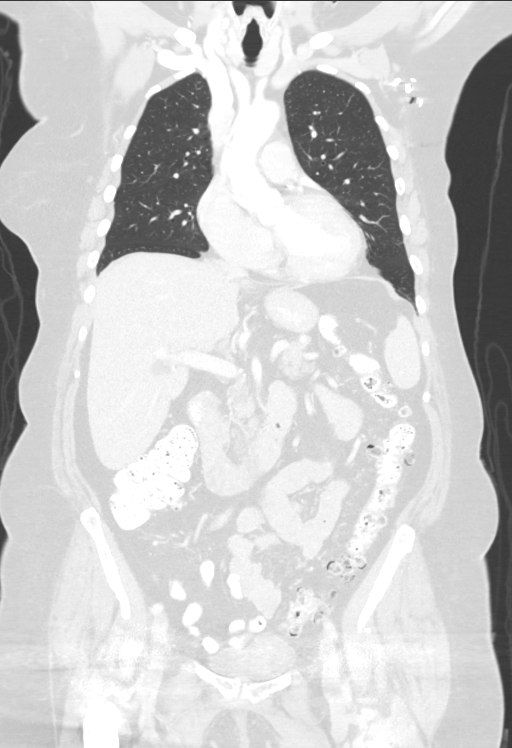
[im 82/137  lung]
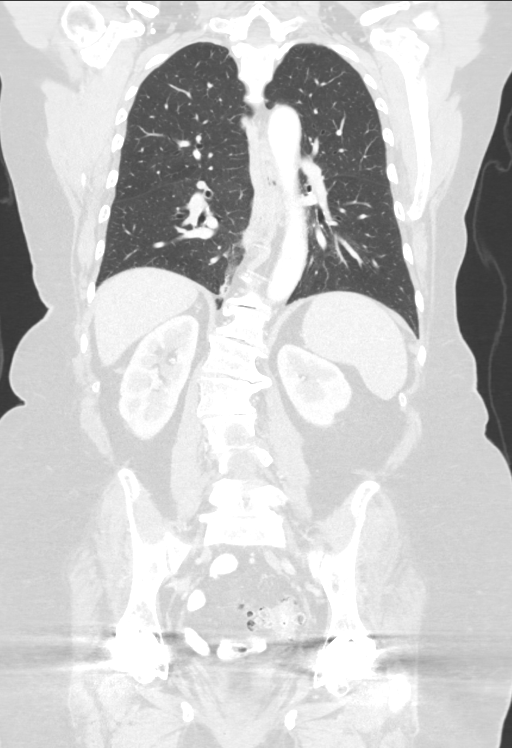

[12 of 36 positions shown; findings below may reference images not displayed]

FINDINGS: CT CHEST FINDINGS

Cardiovascular: The heart size appears normal. No pericardial
effusion. Calcification in the left anterior descending coronary
artery noted. RCA coronary artery calcification also noted

Mediastinum/Nodes: No enlarged mediastinal, hilar, or axillary lymph
nodes. Thyroid gland, trachea demonstrate no significant findings.
Small hiatal hernia.

Lungs/Pleura: No pleural effusion. No airspace consolidation or
atelectasis. Tiny nodule within the lingula measures 3 mm, image 93
of series 4.

Musculoskeletal: Previous left mastectomy and left axillary nodal
dissection. No aggressive lytic or sclerotic bone lesions
identified.

CT ABDOMEN PELVIS FINDINGS

Hepatobiliary: Scattered simple appearing liver cysts noted. No
suspicious liver abnormality. Previous cholecystectomy. No biliary
dilatation.

Pancreas: Unremarkable. No pancreatic ductal dilatation or
surrounding inflammatory changes.

Spleen: Normal in size without focal abnormality.

Adrenals/Urinary Tract: The adrenal glands appear normal. There is a
exophytic hyperdense lesion arising from the posterior cortex of the
inferior pole of the right kidney measuring 1 cm and 40 HU, image 70
of series 2. Small hypodensity within the left kidney measures 7 mm
and is too small to characterize. No hydronephrosis.

Stomach/Bowel: No abnormal distension of the stomach. The small
bowel loops have a normal course and caliber. No bowel obstruction.
Extensive sigmoid diverticulosis identified. No acute inflammation.

Vascular/Lymphatic: Mild aortic atherosclerosis. No aneurysm. No
adenopathy identified within the abdomen. No enlarged pelvic or
inguinal adenopathy.

Reproductive: Status post hysterectomy. No adnexal masses.

Other: No free fluid or fluid collections.

Musculoskeletal: Previous bilateral hip arthroplasty. Degenerative
disc disease and scoliosis identified within the lumbar spine. No
suspicious bone lesions.
IMPRESSION: 1. No mass or adenopathy identified within the chest, abdomen or
pelvis.
2. The spleen is normal in size and appearance
3. Status post left mastectomy and left axillary nodal dissection.
4. Tiny nodule in the left upper lobe measures 3 mm. No follow-up
needed if patient is low-risk. Non-contrast chest CT can be
considered in 12 months if patient is high-risk. This recommendation
follows the consensus statement: Guidelines for Management of
Incidental Pulmonary Nodules Detected on CT Images: From the
5.  Aortic Atherosclerosis (BJM7I-569.9).
6. There is a small indeterminate intermediate attenuating lesion
arising from the right kidney. This could represent a hemorrhagic
cyst or solid enhancing neoplasm. More definitive assessment could
be obtained with renal protocol MRI.

## 2019-07-12 IMAGING — CR DG CHEST 2V
2 series · 2 of 2 positions shown · non-contrast
Comparison: Chest CT, 02/19/2017.  Chest radiographs, 01/27/2013.

CLINICAL DATA: Left side chest pain x 3-4 days. States that she's
under a lot of stress. Pt states that she started to feel pain in
her left foot today. Hx diabetes, htn

EXAM:
CHEST  2 VIEW

[w chest pa]
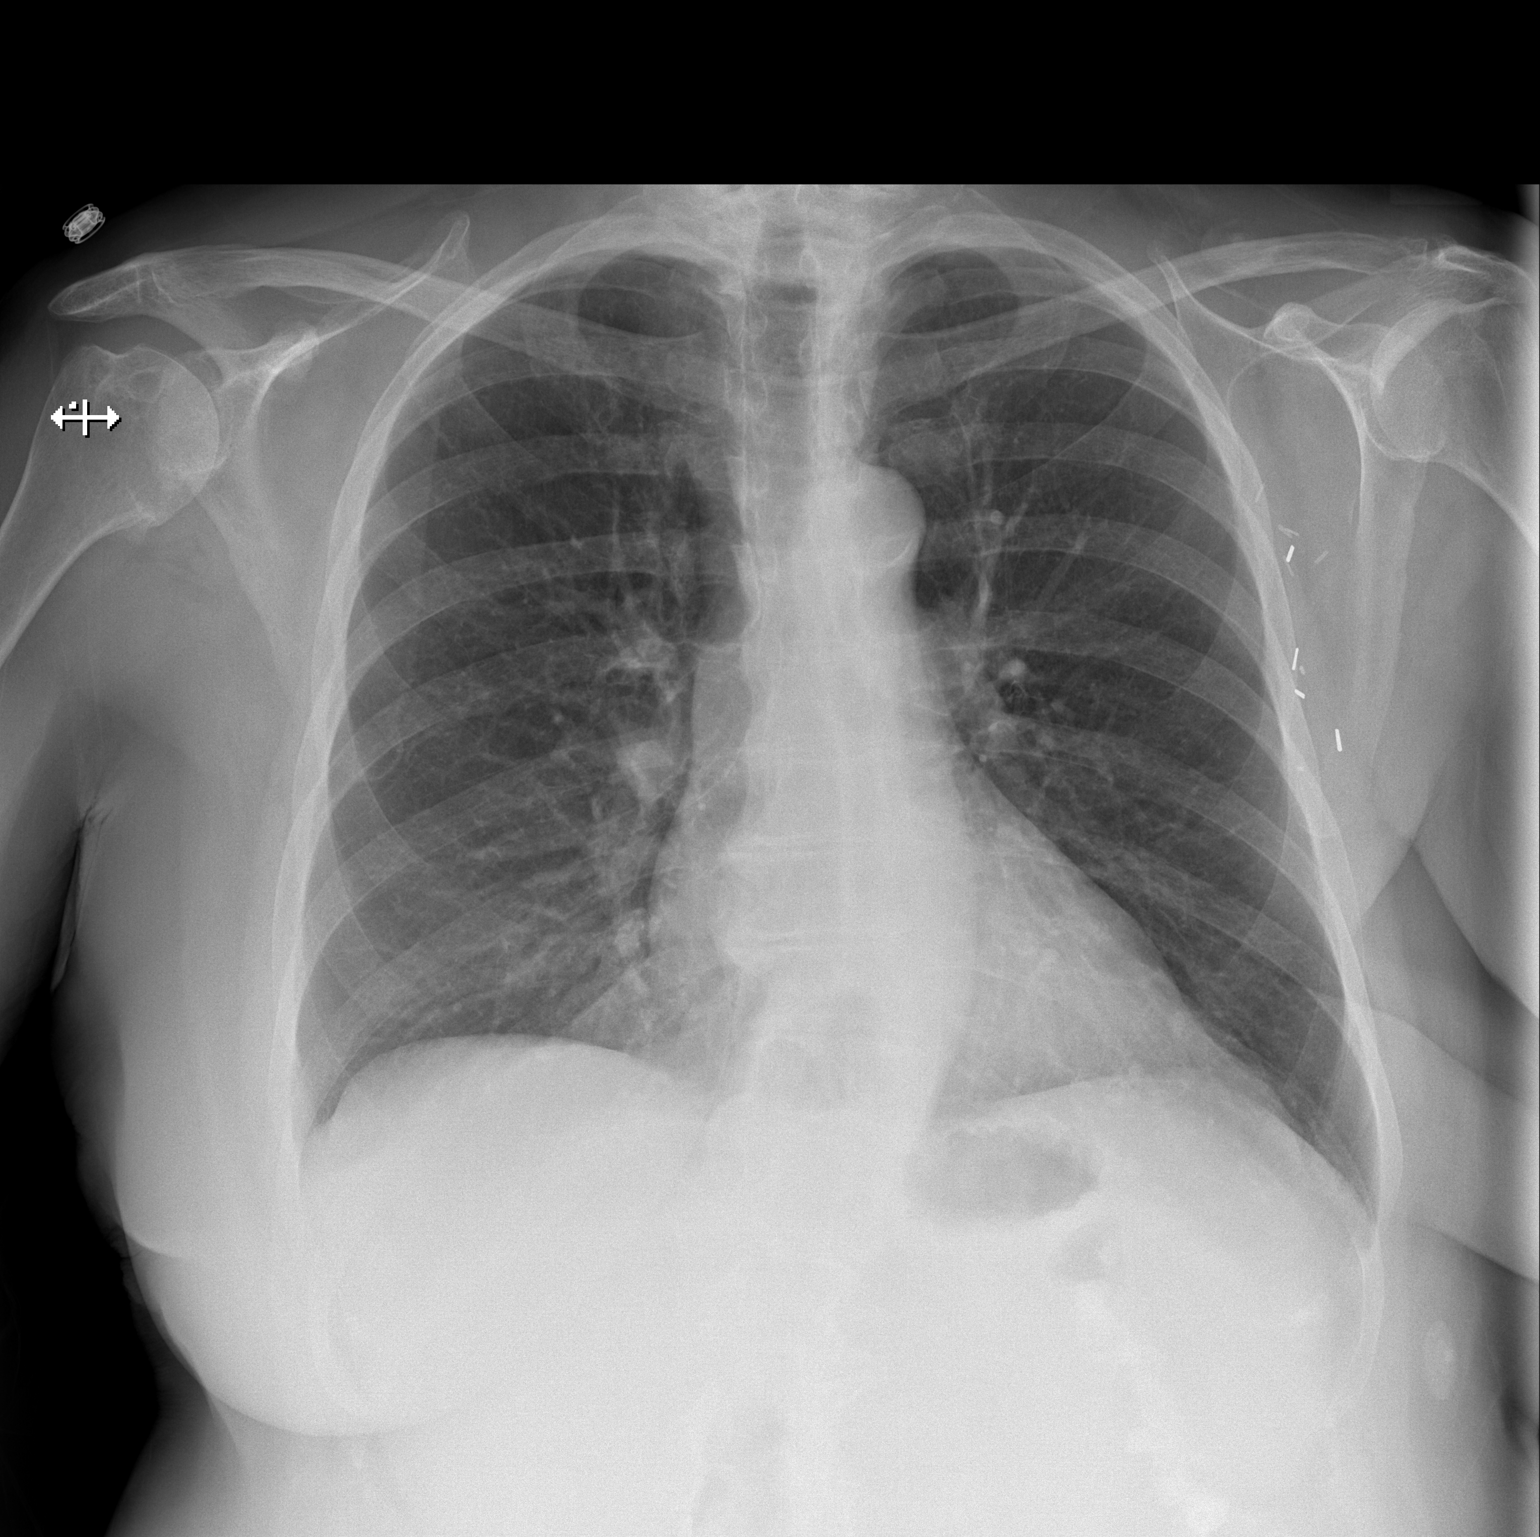

[w chest lat]
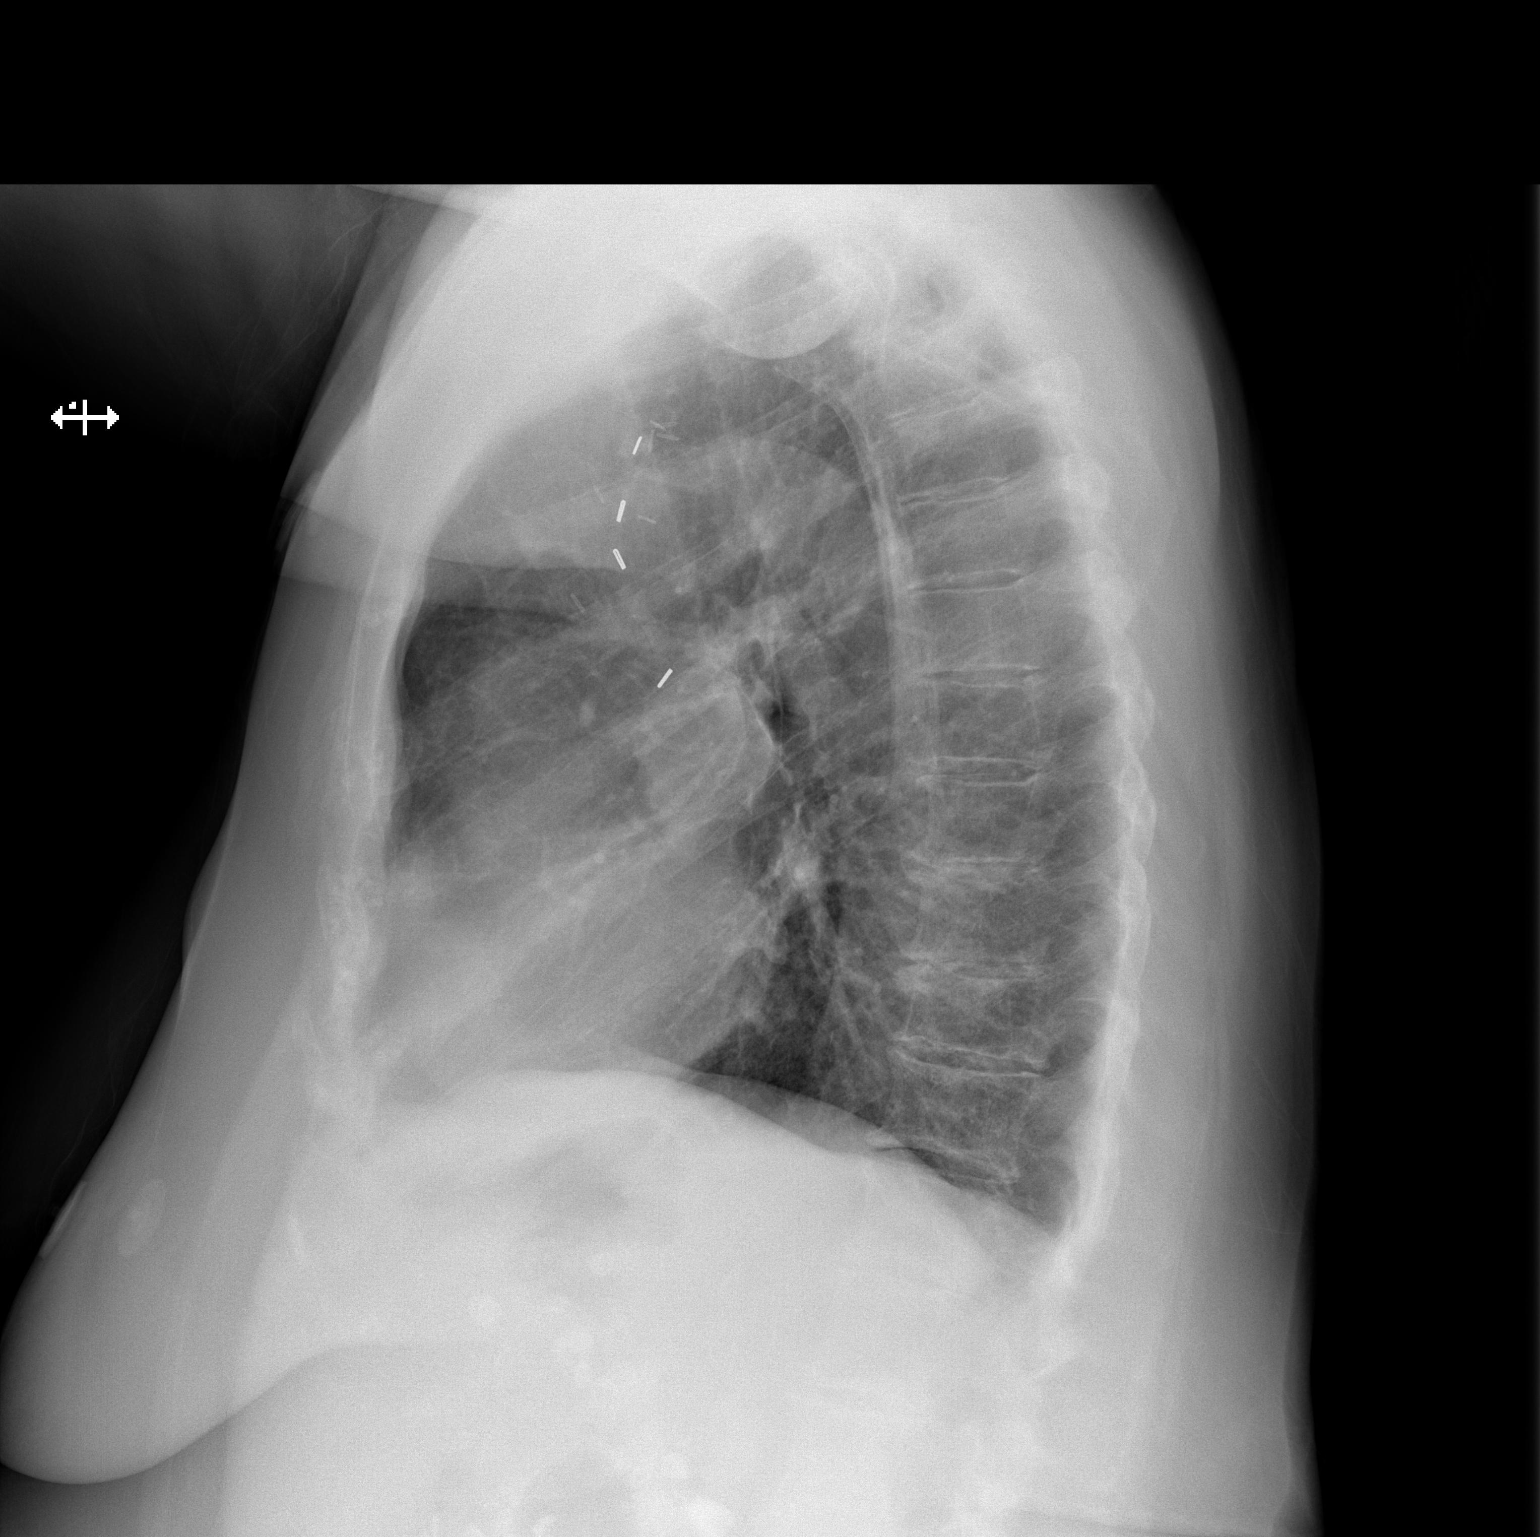

[2 of 2 positions shown; findings below may reference images not displayed]

FINDINGS: Cardiac silhouette is normal in size and configuration. No
mediastinal or hilar masses. No convincing adenopathy.

Clear lungs.  No pleural effusion or pneumothorax.

Stable changes from a left mastectomy.

Skeletal structures are intact.
IMPRESSION: No active cardiopulmonary disease.

## 2019-08-04 ENCOUNTER — Other Ambulatory Visit: Payer: Medicare HMO

## 2019-08-08 ENCOUNTER — Other Ambulatory Visit: Payer: Self-pay | Admitting: Hematology and Oncology

## 2019-08-08 DIAGNOSIS — C9110 Chronic lymphocytic leukemia of B-cell type not having achieved remission: Secondary | ICD-10-CM

## 2019-08-11 ENCOUNTER — Telehealth: Payer: Self-pay | Admitting: Hematology and Oncology

## 2019-08-11 ENCOUNTER — Inpatient Hospital Stay: Payer: Medicare HMO

## 2019-08-11 ENCOUNTER — Inpatient Hospital Stay: Payer: Medicare HMO | Attending: Hematology and Oncology | Admitting: Hematology and Oncology

## 2019-08-11 NOTE — Telephone Encounter (Signed)
Called pt per 7/19 schmsg - no answer. Called daughter and she gave me a new number: [phone]  - no answer. Left message for patient to call back to reschedule appt.

## 2019-11-21 IMAGING — DX DG ABDOMEN 2V
2 series · 2 of 2 positions shown · non-contrast
Comparison: None.

CLINICAL DATA: Lower abdominal pain and distention for 1 month.

EXAM:
ABDOMEN - 2 VIEW

[dg abd 2 views (1 of 2)]
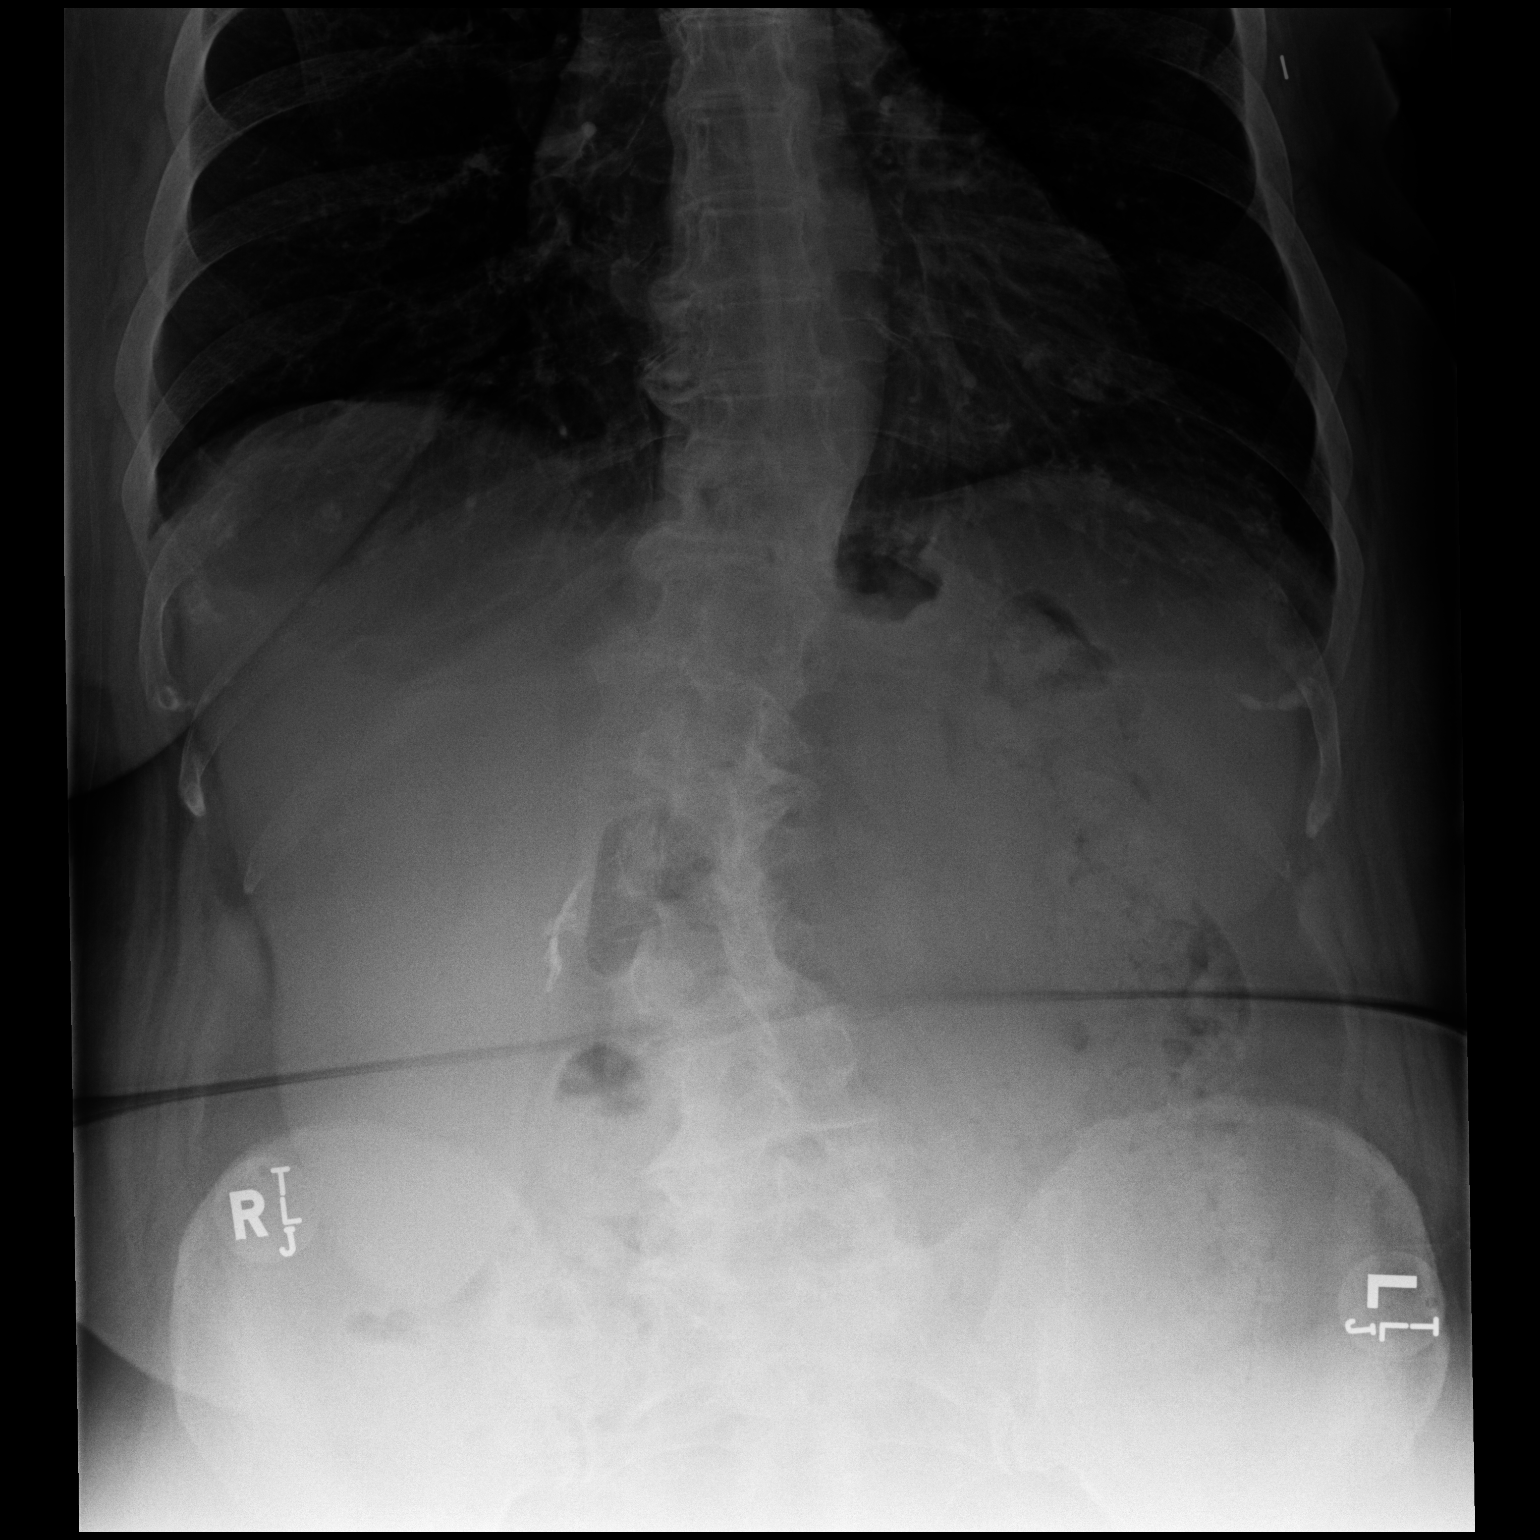

[dg abd 2 views (2 of 2)]
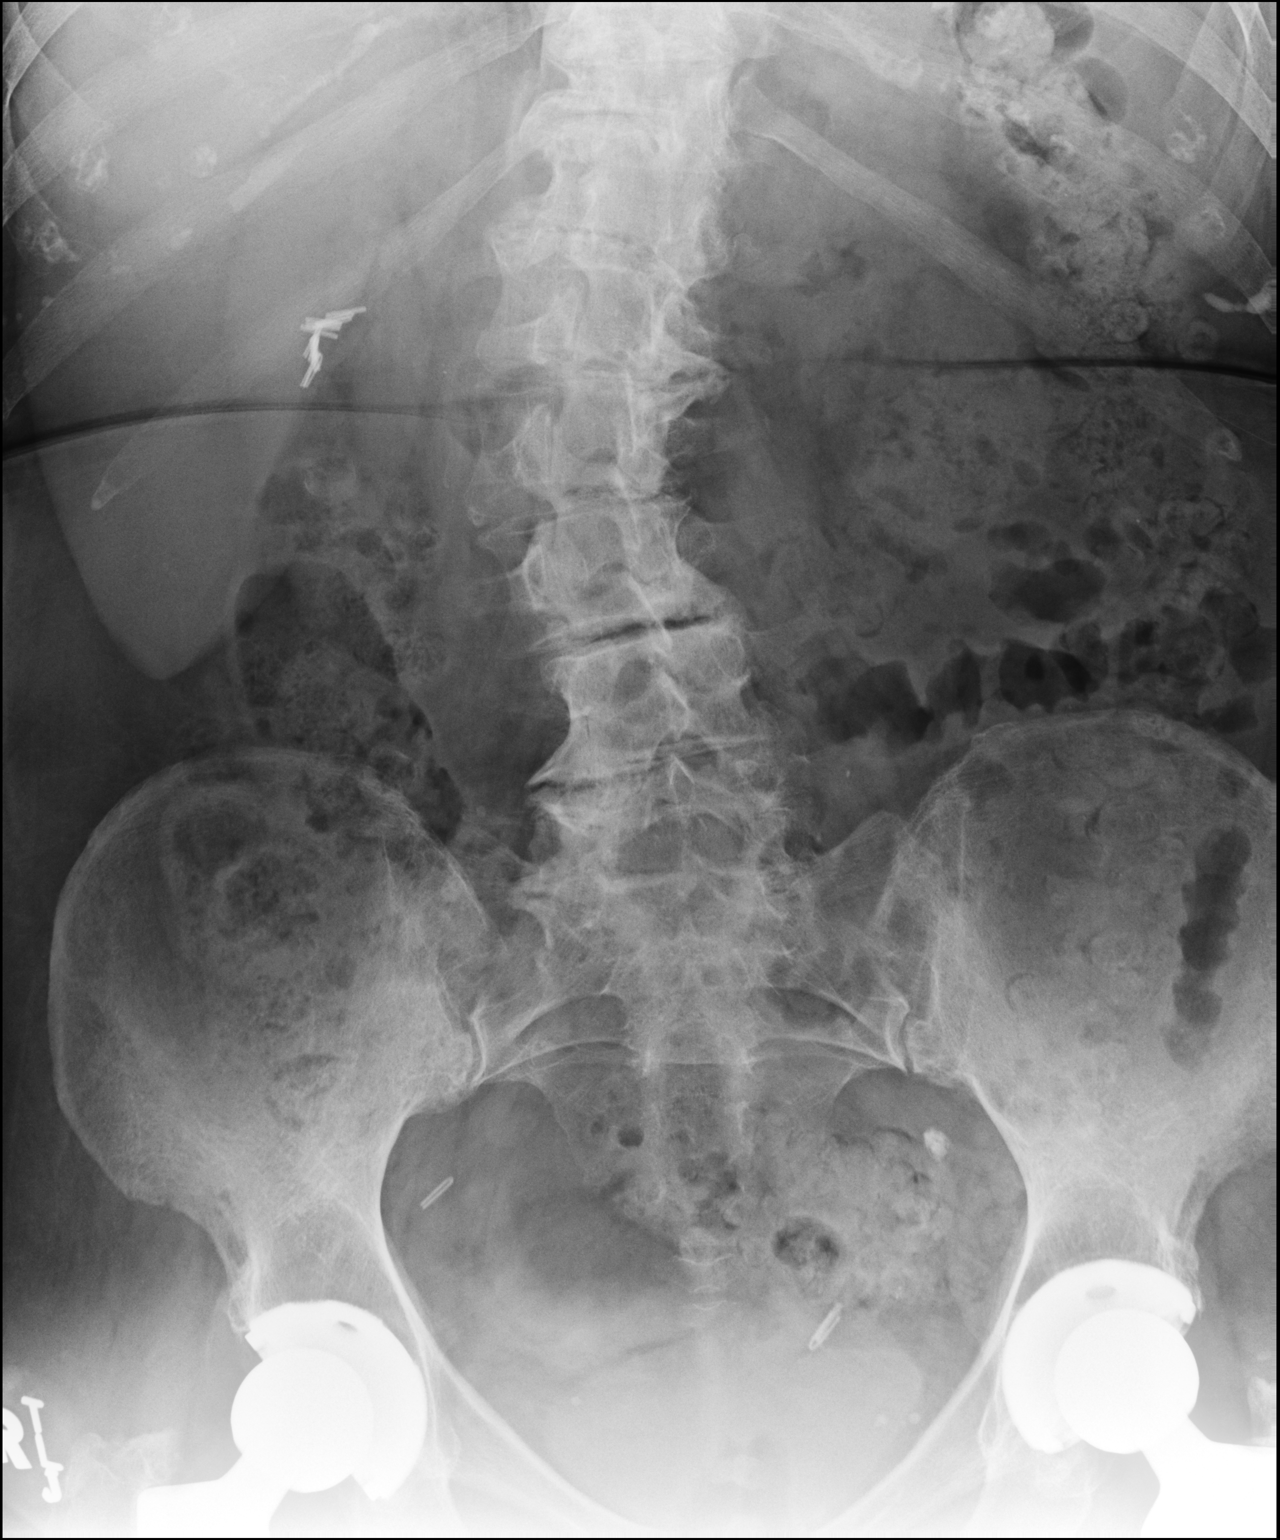

[2 of 2 positions shown; findings below may reference images not displayed]

FINDINGS: No evidence of dilated bowel loops. Large amount of stool seen
throughout the colon. Surgical clips seen in the right upper
quadrant and pelvis. No evidence of free air. Severe lumbar spine
degenerative changes and mild dextroscoliosis noted.
IMPRESSION: No acute findings.  Large stool burden.
